# Patient Record
Sex: Female | Born: 1990 | Race: White | Hispanic: No | Marital: Single | Smoking: Never smoker
Health system: Southern US, Community
[De-identification: ages and names within clinical notes are randomized; demographics above are authoritative.]

## PROBLEM LIST (undated history)

## (undated) DIAGNOSIS — E119 Type 2 diabetes mellitus without complications: Secondary | ICD-10-CM

## (undated) DIAGNOSIS — T7840XA Allergy, unspecified, initial encounter: Secondary | ICD-10-CM

## (undated) DIAGNOSIS — F32A Depression, unspecified: Secondary | ICD-10-CM

## (undated) DIAGNOSIS — Z9889 Other specified postprocedural states: Secondary | ICD-10-CM

## (undated) DIAGNOSIS — J45909 Unspecified asthma, uncomplicated: Secondary | ICD-10-CM

## (undated) DIAGNOSIS — F419 Anxiety disorder, unspecified: Secondary | ICD-10-CM

## (undated) DIAGNOSIS — R519 Headache, unspecified: Secondary | ICD-10-CM

## (undated) DIAGNOSIS — F99 Mental disorder, not otherwise specified: Secondary | ICD-10-CM

## (undated) DIAGNOSIS — R112 Nausea with vomiting, unspecified: Secondary | ICD-10-CM

## (undated) HISTORY — DX: Nausea with vomiting, unspecified: R11.2

## (undated) HISTORY — DX: Unspecified asthma, uncomplicated: J45.909

## (undated) HISTORY — DX: Allergy, unspecified, initial encounter: T78.40XA

---

## 2013-05-27 ENCOUNTER — Ambulatory Visit: Payer: Self-pay | Admitting: Family Medicine

## 2014-03-07 ENCOUNTER — Ambulatory Visit: Payer: Self-pay | Admitting: Family Medicine

## 2014-04-15 ENCOUNTER — Ambulatory Visit (INDEPENDENT_AMBULATORY_CARE_PROVIDER_SITE_OTHER): Payer: BC Managed Care – PPO | Admitting: Family Medicine

## 2014-04-15 ENCOUNTER — Encounter: Payer: Self-pay | Admitting: Family Medicine

## 2014-04-15 VITALS — BP 110/68 | HR 78 | Temp 98.5°F | Resp 16 | Ht 62.0 in | Wt 114.0 lb

## 2014-04-15 DIAGNOSIS — J209 Acute bronchitis, unspecified: Secondary | ICD-10-CM

## 2014-04-15 DIAGNOSIS — J45901 Unspecified asthma with (acute) exacerbation: Secondary | ICD-10-CM

## 2014-04-15 MED ORDER — PREDNISONE 20 MG PO TABS: ORAL_TABLET | ORAL | Status: DC

## 2014-04-15 MED ORDER — ALBUTEROL SULFATE HFA 108 (90 BASE) MCG/ACT IN AERS: 2.0000 | INHALATION_SPRAY | Freq: Four times a day (QID) | RESPIRATORY_TRACT | Status: DC | PRN

## 2014-04-15 MED ORDER — FLUTICASONE PROPIONATE 50 MCG/ACT NA SUSP: 2.0000 | Freq: Every day | NASAL | Status: DC

## 2014-04-15 MED ORDER — AZITHROMYCIN 250 MG PO TABS: ORAL_TABLET | ORAL | Status: DC

## 2014-04-15 NOTE — Progress Notes (Signed)
Subjective:    Patient ID: Angela Bryan, female    DOB: [birth year], 23 y.o.   MRN: [id number]  HPI This is a very pleasant 23 year old white female with past history of asthma and allergies since 4 days of unrelenting cough. He also reports mild shortness of breath and tightness in her chest. She is recently had allergies with postnasal drip rhinorrhea and pain and pressure behind her right ear. She continues to have bilateral maxillary sinus pain and pressure. She continues to have pain and pressure behind her right ear but her primary concern today is a cough. The cough is nonproductive. She denies any hemoptysis. She denies any pleurisy she is not currently taking anything for her allergies. She took one dose of albuterol this morning but did not notice any benefit. Past Medical History  Diagnosis Date  . Allergy   . Asthma    No current outpatient prescriptions on file prior to visit.   No current facility-administered medications on file prior to visit.   Patient has no known drug allergies History   Social History  . Marital Status: Single    Spouse Name: N/A    Number of Children: N/A  . Years of Education: N/A   Occupational History  . Not on file.   Social History Main Topics  . Smoking status: Never Smoker   . Smokeless tobacco: Not on file  . Alcohol Use: Yes     Comment: occasional  . Drug Use: No  . Sexual Activity: Not on file   Other Topics Concern  . Not on file   Social History Narrative  . No narrative on file   No family history on file.    Review of Systems  All other systems reviewed and are negative.      Objective:   Physical Exam  Vitals reviewed. Constitutional: She appears well-developed and well-nourished. No distress.  HENT:  Right Ear: External ear and ear canal normal. Tympanic membrane is bulging.  Left Ear: Tympanic membrane, external ear and ear canal normal.  Nose: Mucosal edema and rhinorrhea present. Right sinus exhibits  no maxillary sinus tenderness and no frontal sinus tenderness. Left sinus exhibits no maxillary sinus tenderness and no frontal sinus tenderness.  Mouth/Throat: Oropharynx is clear and moist. No oropharyngeal exudate.  Cardiovascular: Normal rate, regular rhythm and normal heart sounds.   Pulmonary/Chest: Effort normal. She has decreased breath sounds. She has wheezes.  Skin: She is not diaphoretic.          Assessment & Plan:  1. Asthma with acute exacerbation Patient is mainly having allergies which is exacerbating her asthma triggered an asthma exacerbation. Therefore I recommended a prednisone taper pack and then she began albuterol 2 puffs inhaled every 4-6 hours as needed. Anticipate dramatic improvement over the next 48 hours. I would like her to continue Flonase after the resolution of this attack to prevent future allergy problem/and control asthma triggers - predniSONE (DELTASONE) 20 MG tablet; 3 tabs poqday 1-2, 2 tabs poqday 3-4, 1 tab poqday 5-6  Dispense: 12 tablet; Refill: 0 - albuterol (VENTOLIN HFA) 108 (90 BASE) MCG/ACT inhaler; Inhale 2 puffs into the lungs every 6 (six) hours as needed for wheezing or shortness of breath.  Dispense: 1 Inhaler; Refill: 3 - fluticasone (FLONASE) 50 MCG/ACT nasal spray; Place 2 sprays into both nostrils daily.  Dispense: 16 g; Refill: 6  2. Acute bronchitis Symptoms worsen or she develops a high fever or the cough becomes productive of purulent sputum,  the patient can take the z-pack. - azithromycin (ZITHROMAX) 250 MG tablet; 2 tabs poqday1, 1 tab poqday 2-5  Dispense: 6 tablet; Refill: 0

## 2014-10-21 ENCOUNTER — Encounter: Payer: BC Managed Care – PPO | Admitting: Family Medicine

## 2014-11-05 ENCOUNTER — Telehealth: Payer: Self-pay | Admitting: *Deleted

## 2014-11-05 NOTE — Telephone Encounter (Signed)
Pt called stating needing immunizations record and will come or mom come to pick them up, immunizations record is in file to be picked up up front.

## 2015-04-27 ENCOUNTER — Ambulatory Visit (INDEPENDENT_AMBULATORY_CARE_PROVIDER_SITE_OTHER): Payer: BLUE CROSS/BLUE SHIELD | Admitting: Family Medicine

## 2015-04-27 ENCOUNTER — Encounter: Payer: Self-pay | Admitting: Family Medicine

## 2015-04-27 VITALS — BP 100/64 | HR 98 | Temp 98.5°F | Resp 18 | Wt 118.0 lb

## 2015-04-27 DIAGNOSIS — J4531 Mild persistent asthma with (acute) exacerbation: Secondary | ICD-10-CM | POA: Diagnosis not present

## 2015-04-27 MED ORDER — PREDNISONE 20 MG PO TABS: 60.0000 mg | ORAL_TABLET | Freq: Every day | ORAL | Status: DC

## 2015-04-27 MED ORDER — BECLOMETHASONE DIPROPIONATE 80 MCG/ACT IN AERS: 2.0000 | INHALATION_SPRAY | Freq: Every day | RESPIRATORY_TRACT | Status: DC

## 2015-04-27 MED ORDER — ALBUTEROL SULFATE HFA 108 (90 BASE) MCG/ACT IN AERS: 2.0000 | INHALATION_SPRAY | Freq: Four times a day (QID) | RESPIRATORY_TRACT | Status: DC | PRN

## 2015-04-27 MED ORDER — FLUTICASONE PROPIONATE 50 MCG/ACT NA SUSP: 2.0000 | Freq: Every day | NASAL | Status: DC

## 2015-04-27 NOTE — Progress Notes (Signed)
   Subjective:    Patient ID: Angela Bryan, female    DOB: January 27, 1991, 24 y.o.   MRN: [id number]  HPI Patient presents with one-week of worsening cough, wheezing, shortness of breath, and dyspnea on exertion. She has a history of asthma which is triggered by her allergies and she believes this is the case again. On examination today her airways are tight, she has prolonged expiration, and audible wheezes.  There is no increased work of breathing or respiratory distress. Patient states that 2-3 times year she has to take prednisone for asthma exacerbations. Past Medical History  Diagnosis Date  . Allergy   . Asthma    No past surgical history on file. No current outpatient prescriptions on file prior to visit.   No current facility-administered medications on file prior to visit.   No Known Allergies History   Social History  . Marital Status: Single    Spouse Name: N/A  . Number of Children: N/A  . Years of Education: N/A   Occupational History  . Not on file.   Social History Main Topics  . Smoking status: Never Smoker   . Smokeless tobacco: Not on file  . Alcohol Use: Yes     Comment: occasional  . Drug Use: No  . Sexual Activity: Not on file   Other Topics Concern  . Not on file   Social History Narrative     Review of Systems  All other systems reviewed and are negative.      Objective:   Physical Exam  HENT:  Right Ear: External ear normal.  Left Ear: External ear normal.  Nose: Nose normal.  Mouth/Throat: Oropharynx is clear and moist. No oropharyngeal exudate.  Eyes: Conjunctivae are normal.  Neck: Neck supple.  Cardiovascular: Normal rate, regular rhythm and normal heart sounds.   Pulmonary/Chest: Effort normal. No respiratory distress. She has wheezes.  Abdominal: Soft. Bowel sounds are normal.  Vitals reviewed.         Assessment & Plan:  Asthma with acute exacerbation, mild persistent - Plan: predniSONE (DELTASONE) 20 MG tablet,  albuterol (VENTOLIN HFA) 108 (90 BASE) MCG/ACT inhaler, fluticasone (FLONASE) 50 MCG/ACT nasal spray, beclomethasone (QVAR) 80 MCG/ACT inhaler   begin prednisone 60 mg a day for 5 days. Use albuterol 2 puffs inhaled every 6 hours as needed. Continue her Flonase. Due to the frequency of her exacerbations I recommended that she start Qvar 80 g per actuation, 1 inhalation twice a day.  Recheck in 48 hours if no better or sooner if worse

## 2016-02-10 ENCOUNTER — Ambulatory Visit (INDEPENDENT_AMBULATORY_CARE_PROVIDER_SITE_OTHER): Payer: BC Managed Care – PPO | Admitting: Physician Assistant

## 2016-02-10 ENCOUNTER — Encounter: Payer: Self-pay | Admitting: Physician Assistant

## 2016-02-10 ENCOUNTER — Encounter: Payer: Self-pay | Admitting: Family Medicine

## 2016-02-10 ENCOUNTER — Other Ambulatory Visit: Payer: Self-pay | Admitting: Physician Assistant

## 2016-02-10 VITALS — BP 104/70 | HR 76 | Temp 98.3°F | Resp 18 | Wt 123.0 lb

## 2016-02-10 DIAGNOSIS — H66001 Acute suppurative otitis media without spontaneous rupture of ear drum, right ear: Secondary | ICD-10-CM | POA: Diagnosis not present

## 2016-02-10 DIAGNOSIS — R6883 Chills (without fever): Secondary | ICD-10-CM

## 2016-02-10 DIAGNOSIS — J988 Other specified respiratory disorders: Secondary | ICD-10-CM

## 2016-02-10 DIAGNOSIS — B9689 Other specified bacterial agents as the cause of diseases classified elsewhere: Secondary | ICD-10-CM

## 2016-02-10 LAB — INFLUENZA A AND B AG, IMMUNOASSAY
INFLUENZA B ANTIGEN: NOT DETECTED
Influenza A Antigen: NOT DETECTED

## 2016-02-10 MED ORDER — AMOXICILLIN 875 MG PO TABS: 875.0000 mg | ORAL_TABLET | Freq: Two times a day (BID) | ORAL | Status: DC

## 2016-02-10 NOTE — Progress Notes (Signed)
    Patient ID: Angela Bryan MRN: [id number], DOB: May 11, 1991, 25 y.o. Date of Encounter: 02/10/2016, 10:04 AM    Chief Complaint:  Chief Complaint  Patient presents with  . rt ear ache x 3 days    cough, chill, body aches     HPI: 25 y.o. year old white female presents with above. She says that really she has mostly just been having right ear pain. A little bit of runny nose but not a lot. Right ear pain for about 4 days. It has been very sore. No known fevers or chills. No significant sore throat.     Home Meds:   Outpatient Prescriptions Prior to Visit  Medication Sig Dispense Refill  . albuterol (VENTOLIN HFA) 108 (90 BASE) MCG/ACT inhaler Inhale 2 puffs into the lungs every 6 (six) hours as needed for wheezing or shortness of breath. 1 Inhaler 3  . beclomethasone (QVAR) 80 MCG/ACT inhaler Inhale 2 puffs into the lungs daily. 1 Inhaler 12  . fluticasone (FLONASE) 50 MCG/ACT nasal spray Place 2 sprays into both nostrils daily. 16 g 6  . predniSONE (DELTASONE) 20 MG tablet Take 3 tablets (60 mg total) by mouth daily with breakfast. 15 tablet 0   No facility-administered medications prior to visit.    Allergies: No Known Allergies    Review of Systems: See HPI for pertinent ROS. All other ROS negative.    Physical Exam: Blood pressure 104/70, pulse 76, temperature 98.3 F (36.8 C), temperature source Oral, resp. rate 18, weight 123 lb (55.792 kg)., Body mass index is 22.49 kg/(m^2). General: WNWD WF.  Appears in no acute distress. HEENT: Normocephalic, atraumatic, eyes without discharge, sclera non-icteric, nares are without discharge. Bilateral auditory canals clear, TM's are without perforation. Right TM is dull, erythematous, retracted. Left TM slightly dull, slightly retracted.  Oral cavity moist, posterior pharynx without exudate, erythema, peritonsillar abscess.  Neck: Supple. No thyromegaly. No lymphadenopathy. Lungs: Clear bilaterally to auscultation without  wheezes, rales, or rhonchi. Breathing is unlabored. Heart: Regular rhythm. No murmurs, rubs, or gallops. Msk:  Strength and tone normal for age. Extremities/Skin: Warm and dry. No rashes. Neuro: Alert and oriented X 3. Moves all extremities spontaneously. Gait is normal. CNII-XII grossly in tact. Psych:  Responds to questions appropriately with a normal affect.     ASSESSMENT AND PLAN:  25 y.o. year old female with  1. Bacterial respiratory infection She is to take amoxicillin as directed and complete all of it. Can use over-the-counter decongestants Tylenol and Motrin to help with symptom relief. Some over-the-counter cough medication as needed. F/U if symptoms do not resolve at the completion of antibiotic. - amoxicillin (AMOXIL) 875 MG tablet; Take 1 tablet (875 mg total) by mouth 2 (two) times daily.  Dispense: 14 tablet; Refill: 0  2. Acute suppurative otitis media of right ear without spontaneous rupture of tympanic membrane, recurrence not specified - amoxicillin (AMOXIL) 875 MG tablet; Take 1 tablet (875 mg total) by mouth 2 (two) times daily.  Dispense: 14 tablet; Refill: 0  3. Chills without fever - Influenza a and b   Signed, [address] [place], [place], [facility] 02/10/2016 10:04 AM

## 2017-01-28 ENCOUNTER — Emergency Department (HOSPITAL_COMMUNITY): Payer: BC Managed Care – PPO

## 2017-01-28 ENCOUNTER — Emergency Department (HOSPITAL_COMMUNITY)
Admission: EM | Admit: 2017-01-28 | Discharge: 2017-01-28 | Disposition: A | Discharge status: Home / Self Care | Payer: BC Managed Care – PPO | Attending: Emergency Medicine | Admitting: Emergency Medicine

## 2017-01-28 ENCOUNTER — Encounter (HOSPITAL_COMMUNITY): Payer: Self-pay | Admitting: Emergency Medicine

## 2017-01-28 DIAGNOSIS — Z79899 Other long term (current) drug therapy: Secondary | ICD-10-CM | POA: Diagnosis not present

## 2017-01-28 DIAGNOSIS — R1031 Right lower quadrant pain: Secondary | ICD-10-CM | POA: Diagnosis not present

## 2017-01-28 DIAGNOSIS — J45909 Unspecified asthma, uncomplicated: Secondary | ICD-10-CM | POA: Diagnosis not present

## 2017-01-28 LAB — URINALYSIS, ROUTINE W REFLEX MICROSCOPIC
BACTERIA UA: NONE SEEN
BILIRUBIN URINE: NEGATIVE
Glucose, UA: NEGATIVE mg/dL
Ketones, ur: NEGATIVE mg/dL
Leukocytes, UA: NEGATIVE
Nitrite: NEGATIVE
PH: 5 (ref 5.0–8.0)
Protein, ur: NEGATIVE mg/dL
Specific Gravity, Urine: 1.018 (ref 1.005–1.030)

## 2017-01-28 LAB — COMPREHENSIVE METABOLIC PANEL
ALBUMIN: 4.5 g/dL (ref 3.5–5.0)
ALT: 26 U/L (ref 14–54)
AST: 31 U/L (ref 15–41)
Alkaline Phosphatase: 43 U/L (ref 38–126)
Anion gap: 8 (ref 5–15)
BILIRUBIN TOTAL: 0.4 mg/dL (ref 0.3–1.2)
BUN: 8 mg/dL (ref 6–20)
CHLORIDE: 109 mmol/L (ref 101–111)
CO2: 23 mmol/L (ref 22–32)
CREATININE: 0.62 mg/dL (ref 0.44–1.00)
Calcium: 9.6 mg/dL (ref 8.9–10.3)
GFR calc Af Amer: >60 mL/min (ref >60)
GLUCOSE: 107 mg/dL — AB (ref 65–99)
POTASSIUM: 3.9 mmol/L (ref 3.5–5.1)
Sodium: 140 mmol/L (ref 135–145)
Total Protein: 7.6 g/dL (ref 6.5–8.1)

## 2017-01-28 LAB — POC URINE PREG, ED: PREG TEST UR: NEGATIVE

## 2017-01-28 LAB — CBC
HEMATOCRIT: 42 % (ref 36.0–46.0)
Hemoglobin: 14.1 g/dL (ref 12.0–15.0)
MCH: 28.6 pg (ref 26.0–34.0)
MCHC: 33.6 g/dL (ref 30.0–36.0)
MCV: 85.2 fL (ref 78.0–100.0)
PLATELETS: 239 10*3/uL (ref 150–400)
RBC: 4.93 MIL/uL (ref 3.87–5.11)
RDW: 13.5 % (ref 11.5–15.5)
WBC: 12.2 10*3/uL — AB (ref 4.0–10.5)

## 2017-01-28 LAB — LIPASE, BLOOD: LIPASE: 27 U/L (ref 11–51)

## 2017-01-28 MED ORDER — IOPAMIDOL (ISOVUE-300) INJECTION 61%
INTRAVENOUS | Status: AC
  Administered 2017-01-28: 100 mL
  Filled 2017-01-28: qty 100

## 2017-01-28 MED ORDER — SODIUM CHLORIDE 0.9 % IV BOLUS (SEPSIS)
1000.0000 mL | Freq: Once | INTRAVENOUS | Status: AC
  Administered 2017-01-28: 1000 mL via INTRAVENOUS

## 2017-01-28 NOTE — ED Notes (Signed)
Pt and family understood dc material. NAD noted 

## 2017-01-28 NOTE — ED Notes (Signed)
Patient transported to Ultrasound 

## 2017-01-28 NOTE — ED Triage Notes (Signed)
C/o LUQ pain 2 days ago. Pain is now constant, sharp, RLQ since yesterday with nausea.  Diarrhea earlier in the week that has resolved.  Denies vomiting and urinary complaints.

## 2017-01-28 NOTE — ED Notes (Signed)
Patient transported to CT 

## 2017-01-28 NOTE — ED Provider Notes (Signed)
[facility]-EMERGENCY DEPT Provider Note   CSN: [id number] Arrival date & time: 01/28/17  0100  By signing my name below, I, Angela Bryan, attest that this documentation has been prepared under the direction and in the presence of Nira Conn, MD.  Electronically Signed: Octavia Heir, ED Scribe. 01/28/17. 2:45 AM.    History   Chief Complaint Chief Complaint  Patient presents with  . Abdominal Pain   The history is provided by the patient. No language interpreter was used.   HPI Comments: Angela Bryan is a 26 y.o. female who presents to the Emergency Department complaining of gradual onset, gradual worsening RLQ abdominal pain x 2-3 days. She says the pain is constant and is a sharp sensation. She reports nausea and diarrhea x 3 days. Pt further states she has not had a bowel movement today. Pt says the pain started in her epigastric region which she described as a dull sensation. She notes the pain radiated to her RLQ as the day went on and has become progressively worse. Pt has no abdominal surgical hx of appendectomy nor has she had any surgeries performed in the past. Pt has the nexplanon implanon birth control in her left arm and reports spotting occasionally but says she does not normally et full menstruals. She is currently spotting. She denies dysuria, vaginal discharge, fever, rhinorrhea, cough, or congestion.    Past Medical History:  Diagnosis Date  . Allergy   . Asthma     There are no active problems to display for this patient.   History reviewed. No pertinent surgical history.  OB History    No data available       Home Medications    Prior to Admission medications   Medication Sig Start Date End Date Taking? Authorizing Provider  albuterol (VENTOLIN HFA) 108 (90 BASE) MCG/ACT inhaler Inhale 2 puffs into the lungs every 6 (six) hours as needed for wheezing or shortness of breath. 04/27/15   Donita Brooks, MD  amoxicillin (AMOXIL) 875 MG tablet Take 1  tablet (875 mg total) by mouth 2 (two) times daily. 02/10/16   Patriciaann Clan Dixon, PA-C  beclomethasone (QVAR) 80 MCG/ACT inhaler Inhale 2 puffs into the lungs daily. 04/27/15   Donita Brooks, MD  etonogestrel (NEXPLANON) 68 MG IMPL implant Inject into the skin.    Historical Provider, MD  fluticasone (FLONASE) 50 MCG/ACT nasal spray Place 2 sprays into both nostrils daily. 04/27/15   Donita Brooks, MD    Family History No family history on file.  Social History Social History  Substance Use Topics  . Smoking status: Never Smoker  . Smokeless tobacco: Never Used  . Alcohol use Yes     Comment: occasional     Allergies   Patient has no known allergies.   Review of Systems Review of Systems  A complete 10 system review of systems was obtained and all systems are negative except as noted in the HPI and PMH.   Physical Exam Updated Vital Signs BP 133/92 (BP Location: Left Arm)   Pulse (!) 130   Temp 98.4 F (36.9 C) (Oral)   Resp 16   Ht 5\' 2"  (1.575 m)   Wt 120 lb (54.4 kg)   SpO2 100%   BMI 21.95 kg/m   Physical Exam  Constitutional: She is oriented to person, place, and time. She appears well-developed and well-nourished. No distress.  HENT:  Head: Normocephalic and atraumatic.  Right Ear: External ear normal.  Left Ear:  External ear normal.  Nose: Nose normal.  Eyes: Conjunctivae and EOM are normal. No scleral icterus.  Neck: Normal range of motion and phonation normal.  Cardiovascular: Regular rhythm.  Tachycardia present.   Pulmonary/Chest: Effort normal. No stridor. No respiratory distress.  Abdominal: Soft. She exhibits no distension. There is tenderness in the right lower quadrant. There is rebound. There is no guarding.  Positive Rovsing's, positive obturators sign, positive Psoas  Musculoskeletal: Normal range of motion. She exhibits no edema.  Neurological: She is alert and oriented to person, place, and time.  Skin: She is not diaphoretic.  Psychiatric:  She has a normal mood and affect. Her behavior is normal.  Vitals reviewed.    ED Treatments / Results  DIAGNOSTIC STUDIES: Oxygen Saturation is 100% on RA, normal by my interpretation.  COORDINATION OF CARE:  2:42 AM Discussed treatment plan with pt at bedside and pt agreed to plan.  Labs (all labs ordered are listed, but only abnormal results are displayed) Labs Reviewed  COMPREHENSIVE METABOLIC PANEL - Abnormal; Notable for the following:       Result Value   Glucose, Bld 107 (*)    All other components within normal limits  CBC - Abnormal; Notable for the following:    WBC 12.2 (*)    All other components within normal limits  URINALYSIS, ROUTINE W REFLEX MICROSCOPIC - Abnormal; Notable for the following:    Hgb urine dipstick MODERATE (*)    Squamous Epithelial / LPF 0-5 (*)    All other components within normal limits  LIPASE, BLOOD  POC URINE PREG, ED    EKG  EKG Interpretation None       Radiology US Transvaginal Non-ob  Result Date: 01/28/2017 CLINICAL DATA:  Right lower quadrant pain. EXAM: TRANSABDOMINAL AND TRANSVAGINAL ULTRASOUND OF PELVIS DOPPLER ULTRASOUND OF OVARIES TECHNIQUE: Both transabdominal and transvaginal ultrasound examinations of the pelvis were performed. Transabdominal technique was performed for global imaging of the pelvis including uterus, ovaries, adnexal regions, and pelvic cul-de-sac. It was necessary to proceed with endovaginal exam following the transabdominal exam to visualize the endometrium and ovaries. Color and duplex Doppler ultrasound was utilized to evaluate blood flow to the ovaries. COMPARISON:  CT abdomen/ pelvis earlier this day. FINDINGS: Uterus Measurements: 6.4 x 2.5 x 4.0 cm. No fibroids or other mass visualized. Endometrium Thickness: 6.2 mm. No focal abnormality visualized. Right ovary Measurements: 4.5 x 2.3 x 2.7 cm. Collapsing corpus luteal cyst measures 2.7 cm. There is normal blood flow. No adnexal mass. Left ovary  Measurements: 2.7 x 3.7 x 2.6 cm. Normal appearance/no adnexal mass. Normal blood flow. Pulsed Doppler evaluation of both ovaries demonstrates normal low-resistance arterial and venous waveforms. Other findings Small volume free fluid is physiologic. IMPRESSION: 1. Normal blood flow to both ovaries, no ovarian torsion. 2. Collapsing corpus luteal cyst in the right ovary, physiologic, likely the cause of patient's pain. Electronically Signed   By: Rubye Oaks M.D.   On: 01/28/2017 05:39   US Pelvis Complete  Result Date: 01/28/2017 CLINICAL DATA:  Right lower quadrant pain. EXAM: TRANSABDOMINAL AND TRANSVAGINAL ULTRASOUND OF PELVIS DOPPLER ULTRASOUND OF OVARIES TECHNIQUE: Both transabdominal and transvaginal ultrasound examinations of the pelvis were performed. Transabdominal technique was performed for global imaging of the pelvis including uterus, ovaries, adnexal regions, and pelvic cul-de-sac. It was necessary to proceed with endovaginal exam following the transabdominal exam to visualize the endometrium and ovaries. Color and duplex Doppler ultrasound was utilized to evaluate blood flow to the ovaries. COMPARISON:  CT abdomen/ pelvis earlier this day. FINDINGS: Uterus Measurements: 6.4 x 2.5 x 4.0 cm. No fibroids or other mass visualized. Endometrium Thickness: 6.2 mm. No focal abnormality visualized. Right ovary Measurements: 4.5 x 2.3 x 2.7 cm. Collapsing corpus luteal cyst measures 2.7 cm. There is normal blood flow. No adnexal mass. Left ovary Measurements: 2.7 x 3.7 x 2.6 cm. Normal appearance/no adnexal mass. Normal blood flow. Pulsed Doppler evaluation of both ovaries demonstrates normal low-resistance arterial and venous waveforms. Other findings Small volume free fluid is physiologic. IMPRESSION: 1. Normal blood flow to both ovaries, no ovarian torsion. 2. Collapsing corpus luteal cyst in the right ovary, physiologic, likely the cause of patient's pain. Electronically Signed   By: Rubye Oaks M.D.   On: 01/28/2017 05:39   Ct Abdomen Pelvis W Contrast  Result Date: 01/28/2017 CLINICAL DATA:  Right lower quadrant pain and nausea. EXAM: CT ABDOMEN AND PELVIS WITH CONTRAST TECHNIQUE: Multidetector CT imaging of the abdomen and pelvis was performed using the standard protocol following bolus administration of intravenous contrast. CONTRAST:  ISOVUE-300 IOPAMIDOL (ISOVUE-300) INJECTION 61% COMPARISON:  None. FINDINGS: Lower chest: Lung bases are clear. Hepatobiliary: No focal liver abnormality is seen. No gallstones, gallbladder wall thickening, or biliary dilatation. Pancreas: No ductal dilatation or inflammation. Spleen: Normal in size without focal abnormality. Splenules anteriorly. Adrenals/Urinary Tract: Adrenal glands are unremarkable. Kidneys are normal, without renal calculi, focal lesion, or hydronephrosis. Bladder is unremarkable. Stomach/Bowel: Stomach is within normal limits. Appendix appears normal. No evidence of bowel wall thickening, distention, or inflammatory changes. Moderate stool burden throughout the colon. Vascular/Lymphatic: No significant vascular findings are present. No enlarged abdominal or pelvic lymph nodes. Reproductive: Peripherally enhancing corpus luteal cyst in the right ovary. Left ovary is normal in size. Uterus is unremarkable. Trace free fluid in the pelvis is physiologic. Other: No free air or intra- abdominal abscess. Tiny fat containing umbilical hernia. Musculoskeletal: There are no acute or suspicious osseous abnormalities. Scattered bone islands in the pelvis and proximal femora. IMPRESSION: 1. Normal appendix. 2. Corpus luteal cyst in the right ovary, physiologic, but likely cause of patient's pain. Electronically Signed   By: Rubye Oaks M.D.   On: 01/28/2017 03:48   [place] Art/ven Flow Abd Pelv Doppler  Result Date: 01/28/2017 CLINICAL DATA:  Right lower quadrant pain. EXAM: TRANSABDOMINAL AND TRANSVAGINAL ULTRASOUND OF PELVIS DOPPLER  ULTRASOUND OF OVARIES TECHNIQUE: Both transabdominal and transvaginal ultrasound examinations of the pelvis were performed. Transabdominal technique was performed for global imaging of the pelvis including uterus, ovaries, adnexal regions, and pelvic cul-de-sac. It was necessary to proceed with endovaginal exam following the transabdominal exam to visualize the endometrium and ovaries. Color and duplex Doppler ultrasound was utilized to evaluate blood flow to the ovaries. COMPARISON:  CT abdomen/ pelvis earlier this day. FINDINGS: Uterus Measurements: 6.4 x 2.5 x 4.0 cm. No fibroids or other mass visualized. Endometrium Thickness: 6.2 mm. No focal abnormality visualized. Right ovary Measurements: 4.5 x 2.3 x 2.7 cm. Collapsing corpus luteal cyst measures 2.7 cm. There is normal blood flow. No adnexal mass. Left ovary Measurements: 2.7 x 3.7 x 2.6 cm. Normal appearance/no adnexal mass. Normal blood flow. Pulsed Doppler evaluation of both ovaries demonstrates normal low-resistance arterial and venous waveforms. Other findings Small volume free fluid is physiologic. IMPRESSION: 1. Normal blood flow to both ovaries, no ovarian torsion. 2. Collapsing corpus luteal cyst in the right ovary, physiologic, likely the cause of patient's pain. Electronically Signed   By: Lujean Rave.D.  On: 01/28/2017 05:39    Procedures Procedures (including critical care time)  Medications Ordered in ED Medications  sodium chloride 0.9 % bolus 1,000 mL (0 mLs Intravenous Stopped 01/28/17 0512)  iopamidol (ISOVUE-300) 61 % injection (100 mLs  Contrast Given 01/28/17 0332)     Initial Impression / Assessment and Plan / ED Course  I have reviewed the triage vital signs and the nursing notes.  Pertinent labs & imaging results that were available during my care of the patient were reviewed by me and considered in my medical decision making (see chart for details).  Clinical Course as of Jan 28 553  Sat Jan 28, 2017  [id number]  Concern for appendicitis. No GU symptoms. Low suspicion for STI/PID. UA w/o infection; blood like contaminate from vaginal spotting. Low suspicion for torsion given duration.  [PC]  0242 Declined pain medicine at this time.   [PC]  0547 CT w/o appendicitis. Did note right adnexal cyst. Will obtain US for better visualization.  [PC]  [id number] US confirmed corpus luteum. The patient is safe for discharge with strict return precautions.   [PC]    Clinical Course User Index [PC] Nira ConnPedro Eduardo Laquinn Shippy, MD      Final Clinical Impressions(s) / ED Diagnoses   Final diagnoses:  RLQ abdominal pain   Disposition: Discharge  Condition: Good  I have discussed the results, Dx and Tx plan with the patient who expressed understanding and agree(s) with the plan. Discharge instructions discussed at great length. The patient was given strict return precautions who verbalized understanding of the instructions. No further questions at time of discharge.    New Prescriptions   No medications on file    Follow Up: Donita BrooksWarren T Pickard, MD [address] [place] [id number] [phone]  Schedule an appointment as soon as possible for a visit  As needed   I personally performed the services described in this documentation, which was scribed in my presence. The recorded information has been reviewed and is accurate.        Nira ConnPedro Eduardo Montine Hight, MD 01/28/17 [phone]

## 2017-01-30 ENCOUNTER — Telehealth: Payer: Self-pay | Admitting: Family Medicine

## 2017-01-30 NOTE — Telephone Encounter (Signed)
Pt's mother called [facility] stating that pt went to er and dx with ovarian cyst and is in a lot of pain and having trouble walking, having N,V & D could I please call her daughter. Called and spoke to pt to set up appt. and she was feeling better and she was at work and could not come in for an appointment but if she got worse she would call us back.

## 2017-02-01 ENCOUNTER — Ambulatory Visit: Payer: BC Managed Care – PPO | Admitting: Family Medicine

## 2017-02-03 ENCOUNTER — Ambulatory Visit
Admission: RE | Admit: 2017-02-03 | Discharge: 2017-02-03 | Disposition: A | Discharge status: Home / Self Care | Payer: BC Managed Care – PPO | Source: Ambulatory Visit | Attending: Family Medicine | Admitting: Family Medicine

## 2017-02-03 ENCOUNTER — Ambulatory Visit (INDEPENDENT_AMBULATORY_CARE_PROVIDER_SITE_OTHER): Payer: BC Managed Care – PPO | Admitting: Family Medicine

## 2017-02-03 ENCOUNTER — Encounter: Payer: Self-pay | Admitting: Family Medicine

## 2017-02-03 VITALS — BP 122/64 | HR 110 | Temp 99.4°F | Resp 14 | Ht 62.5 in | Wt 131.0 lb

## 2017-02-03 DIAGNOSIS — D72829 Elevated white blood cell count, unspecified: Secondary | ICD-10-CM

## 2017-02-03 DIAGNOSIS — K5900 Constipation, unspecified: Secondary | ICD-10-CM | POA: Insufficient documentation

## 2017-02-03 DIAGNOSIS — R1084 Generalized abdominal pain: Secondary | ICD-10-CM | POA: Diagnosis present

## 2017-02-03 LAB — CBC
HCT: 37.8 % (ref 35.0–45.0)
Hemoglobin: 12.9 g/dL (ref 12.0–15.0)
MCH: 29.4 pg (ref 27.0–33.0)
MCHC: 34.1 g/dL (ref 32.0–36.0)
MCV: 86.1 fL (ref 80.0–100.0)
PLATELETS: 291 10*3/uL (ref 140–400)
RBC: 4.39 MIL/uL (ref 3.80–5.10)
RDW: 13.2 % (ref 11.0–15.0)
WBC: 11.4 10*3/uL — ABNORMAL HIGH (ref 3.8–10.8)

## 2017-02-03 LAB — URINALYSIS, ROUTINE W REFLEX MICROSCOPIC
Bilirubin Urine: NEGATIVE
GLUCOSE, UA: NEGATIVE
Leukocytes, UA: NEGATIVE
NITRITE: NEGATIVE
PH: 5.5 (ref 5.0–8.0)
Protein, ur: NEGATIVE
Specific Gravity, Urine: 1.02 (ref 1.001–1.035)

## 2017-02-03 LAB — URINALYSIS, MICROSCOPIC ONLY
Casts: NONE SEEN [LPF]
Crystals: NONE SEEN [HPF]
Yeast: NONE SEEN [HPF]

## 2017-02-03 MED ORDER — ONDANSETRON 4 MG PO TBDP: 4.0000 mg | ORAL_TABLET | Freq: Three times a day (TID) | ORAL | 0 refills | Status: DC | PRN

## 2017-02-03 NOTE — Patient Instructions (Addendum)
Take miralax 1 cap full daily twice a day until bowel movement, then go down to 1 a day, if you get diarrhea you can stop  Take the motrin as needed Get xray

## 2017-02-03 NOTE — Progress Notes (Signed)
Subjective:    Patient ID: Angela Bryan, female    DOB: 12-19-1991, 26 y.o.   MRN: [id number]  Patient presents for ED F/U (right sided lower abd pain, heavy bleeding with period, N/V, elevated WBC- dx: R ovarian cyst) Patient here to follow-up emergency room visit. She was seen in the emergency room on the third with right lower quadrant abdominal pain CT scan which done which showed right ovarian cyst. Ultrasound was done which showed collapsing corpus luteal cyst in the right ovary as the cause of her pain. She is currently on nexplanon  for birth control. She had labs done which showed mildly elevated white blood cell count 12.2 metabolic panel was unremarkable urine pregnancy test was negative , urinalysis showed moderate blood but only 0-5 red blood cells showed a in the microscopy otherwise no sign of infection.  She had improvement in pain for about 24 hours but then started bleeding on Sat heavy until Wed. She has had constant pain mostly on right side, for the past few days. Had N/V today after eating applesauce. Had a few other episodes of nausea this week and one other episode of vomiting she does not recall when. Denies vaginal discharge, no dysuria, has not had BM in 2 days, CT did show moderate stool burden.     Review Of Systems:  GEN- denies fatigue, fever, weight loss,weakness, recent illness HEENT- denies eye drainage, change in vision, nasal discharge, CVS- denies chest pain, palpitations RESP- denies SOB, cough, wheeze ABD- denies N/V, +change in stools, +abd pain GU- denies dysuria, hematuria, dribbling, incontinence MSK- denies joint pain, muscle aches, injury Neuro- denies headache, dizziness, syncope, seizure activity       Objective:    BP 122/64   Pulse (!) 110   Temp 99.4 F (37.4 C) (Oral)   Resp 14   Ht 5' 2.5" (1.588 m)   Wt 131 lb (59.4 kg)   SpO2 98%   BMI 23.58 kg/m  GEN- NAD, alert and oriented x3, non toxic appearing  HEENT- PERRL, EOMI,  non injected sclera, pink conjunctiva, MMM, oropharynx clear CVS- mild tachycardia HR 100, no murmur RESP-CTAB ABD-NABS,soft,ND, mild TTP diffusely, no rebound, no guarding, no CVA tenderness  Pulses- Radial 2+        Assessment & Plan:      Problem List Items Addressed This Visit    None    Visit Diagnoses    Generalized abdominal pain    -  Primary   Unclear cause may have viral illness or white blood cell count is improved today. It is possible she has multiple things going on. She did have the luteal cyst was slightly did rupture and it appears that she has had a cycle on her Nexplanon. Her urine pregnancy is negative. There is no sign of urinary tract infection. She denies any vaginal discharge. Constipation is deathly seen on the scan which continue be contributing to some abdominal pain as well as nausea and emesis and she's not had a bowel movement a few days however I do want to rule out any evidence of obstruction since her symptoms seem to be worsening on that right side. There was no sign of any appendicitis no gallstones seen on of any coli this. She looks fairly well on examination.  Zofran sent for nausea, push fluids if xray neg for obstruction Mother also here with patient   Relevant Orders   Urinalysis, Routine w reflex microscopic (Completed)   Urine culture  CBC (Completed)   Comprehensive metabolic panel   DG Abd 2 Views   Constipation, unspecified constipation type       Will use miralax if Xray shows no obstruction    Relevant Orders   DG Abd 2 Views   Leukocytosis, unspecified type       improved, UA shows dehydration only    Relevant Orders   CBC (Completed)      Note: This dictation was prepared with Dragon dictation along with smaller phrase technology. Any transcriptional errors that result from this process are unintentional.

## 2017-02-04 LAB — COMPREHENSIVE METABOLIC PANEL
ALK PHOS: 48 U/L (ref 33–115)
ALT: 20 U/L (ref 6–29)
AST: 17 U/L (ref 10–30)
Albumin: 4.5 g/dL (ref 3.6–5.1)
BUN: 7 mg/dL (ref 7–25)
CALCIUM: 9.4 mg/dL (ref 8.6–10.2)
CO2: 24 mmol/L (ref 20–31)
Chloride: 105 mmol/L (ref 98–110)
Creat: 0.72 mg/dL (ref 0.50–1.10)
GLUCOSE: 83 mg/dL (ref 70–99)
POTASSIUM: 4.7 mmol/L (ref 3.5–5.3)
Sodium: 140 mmol/L (ref 135–146)
Total Bilirubin: 0.5 mg/dL (ref 0.2–1.2)
Total Protein: 7.4 g/dL (ref 6.1–8.1)

## 2017-02-05 LAB — URINE CULTURE: ORGANISM ID, BACTERIA: NO GROWTH

## 2017-02-06 ENCOUNTER — Emergency Department: Payer: BC Managed Care – PPO

## 2017-02-06 ENCOUNTER — Observation Stay
Admission: EM | Admit: 2017-02-06 | Discharge: 2017-02-07 | Disposition: A | Discharge status: Home / Self Care | Payer: BC Managed Care – PPO | Attending: Obstetrics & Gynecology | Admitting: Obstetrics & Gynecology

## 2017-02-06 ENCOUNTER — Encounter: Payer: Self-pay | Admitting: Emergency Medicine

## 2017-02-06 DIAGNOSIS — A749 Chlamydial infection, unspecified: Secondary | ICD-10-CM

## 2017-02-06 DIAGNOSIS — N739 Female pelvic inflammatory disease, unspecified: Secondary | ICD-10-CM

## 2017-02-06 DIAGNOSIS — A5611 Chlamydial female pelvic inflammatory disease: Secondary | ICD-10-CM | POA: Diagnosis not present

## 2017-02-06 DIAGNOSIS — A419 Sepsis, unspecified organism: Secondary | ICD-10-CM

## 2017-02-06 DIAGNOSIS — J45909 Unspecified asthma, uncomplicated: Secondary | ICD-10-CM | POA: Diagnosis not present

## 2017-02-06 DIAGNOSIS — R000 Tachycardia, unspecified: Secondary | ICD-10-CM | POA: Diagnosis not present

## 2017-02-06 DIAGNOSIS — R102 Pelvic and perineal pain: Secondary | ICD-10-CM | POA: Diagnosis present

## 2017-02-06 DIAGNOSIS — Z79899 Other long term (current) drug therapy: Secondary | ICD-10-CM | POA: Diagnosis not present

## 2017-02-06 DIAGNOSIS — Z7951 Long term (current) use of inhaled steroids: Secondary | ICD-10-CM | POA: Diagnosis not present

## 2017-02-06 HISTORY — DX: Female pelvic inflammatory disease, unspecified: N73.9

## 2017-02-06 LAB — COMPREHENSIVE METABOLIC PANEL
ALBUMIN: 3.6 g/dL (ref 3.5–5.0)
ALK PHOS: 51 U/L (ref 38–126)
ALT: 17 U/L (ref 14–54)
ANION GAP: 8 (ref 5–15)
AST: 22 U/L (ref 15–41)
BUN: 6 mg/dL (ref 6–20)
CALCIUM: 8.1 mg/dL — AB (ref 8.9–10.3)
CO2: 22 mmol/L (ref 22–32)
CREATININE: 0.73 mg/dL (ref 0.44–1.00)
Chloride: 106 mmol/L (ref 101–111)
GFR calc Af Amer: >60 mL/min (ref >60)
GFR calc non Af Amer: >60 mL/min (ref >60)
GLUCOSE: 96 mg/dL (ref 65–99)
Potassium: 3.9 mmol/L (ref 3.5–5.1)
SODIUM: 136 mmol/L (ref 135–145)
Total Bilirubin: 0.9 mg/dL (ref 0.3–1.2)
Total Protein: 7.2 g/dL (ref 6.5–8.1)

## 2017-02-06 LAB — CBC
HCT: 38.8 % (ref 35.0–47.0)
HEMOGLOBIN: 13.7 g/dL (ref 12.0–16.0)
MCH: 29.9 pg (ref 26.0–34.0)
MCHC: 35.3 g/dL (ref 32.0–36.0)
MCV: 84.7 fL (ref 80.0–100.0)
Platelets: 345 10*3/uL (ref 150–440)
RBC: 4.59 MIL/uL (ref 3.80–5.20)
RDW: 12.8 % (ref 11.5–14.5)
WBC: 13.8 10*3/uL — ABNORMAL HIGH (ref 3.6–11.0)

## 2017-02-06 LAB — URINALYSIS, COMPLETE (UACMP) WITH MICROSCOPIC
Bacteria, UA: NONE SEEN
Bilirubin Urine: NEGATIVE
GLUCOSE, UA: NEGATIVE mg/dL
Hgb urine dipstick: NEGATIVE
Ketones, ur: 20 mg/dL — AB
Nitrite: NEGATIVE
PROTEIN: NEGATIVE mg/dL
Specific Gravity, Urine: 1.003 — ABNORMAL LOW (ref 1.005–1.030)
pH: 6 (ref 5.0–8.0)

## 2017-02-06 LAB — WET PREP, GENITAL
Clue Cells Wet Prep HPF POC: NONE SEEN
Sperm: NONE SEEN
Trich, Wet Prep: NONE SEEN
YEAST WET PREP: NONE SEEN

## 2017-02-06 LAB — CHLAMYDIA/NGC RT PCR (ARMC ONLY)
CHLAMYDIA TR: DETECTED — AB
N gonorrhoeae: NOT DETECTED

## 2017-02-06 LAB — HCG, QUANTITATIVE, PREGNANCY: hCG, Beta Chain, Quant, S: 1 m[IU]/mL (ref <5)

## 2017-02-06 LAB — LIPASE, BLOOD: Lipase: 20 U/L (ref 11–51)

## 2017-02-06 MED ORDER — ONDANSETRON HCL 4 MG/2ML IJ SOLN
4.0000 mg | Freq: Once | INTRAMUSCULAR | Status: AC
  Administered 2017-02-06: 4 mg via INTRAVENOUS

## 2017-02-06 MED ORDER — CLINDAMYCIN PHOSPHATE 900 MG/50ML IV SOLN
900.0000 mg | Freq: Three times a day (TID) | INTRAVENOUS | Status: DC
  Administered 2017-02-06 – 2017-02-07 (×3): 900 mg via INTRAVENOUS
  Filled 2017-02-06 (×7): qty 50

## 2017-02-06 MED ORDER — AZITHROMYCIN 250 MG PO TABS
1000.0000 mg | ORAL_TABLET | Freq: Once | ORAL | Status: AC
  Administered 2017-02-06: 1000 mg via ORAL
  Filled 2017-02-06: qty 2
  Filled 2017-02-06: qty 4

## 2017-02-06 MED ORDER — IOPAMIDOL (ISOVUE-300) INJECTION 61%
15.0000 mL | INTRAVENOUS | Status: AC
  Administered 2017-02-06: 15 mL via ORAL

## 2017-02-06 MED ORDER — SODIUM CHLORIDE 0.9 % IV BOLUS (SEPSIS)
1000.0000 mL | Freq: Once | INTRAVENOUS | Status: AC
  Administered 2017-02-06: 1000 mL via INTRAVENOUS

## 2017-02-06 MED ORDER — ONDANSETRON HCL 4 MG/2ML IJ SOLN
4.0000 mg | INTRAMUSCULAR | Status: DC | PRN
  Administered 2017-02-06 – 2017-02-07 (×3): 4 mg via INTRAVENOUS
  Filled 2017-02-06 (×2): qty 2

## 2017-02-06 MED ORDER — ONDANSETRON HCL 4 MG/2ML IJ SOLN
INTRAMUSCULAR | Status: AC
  Administered 2017-02-06: 4 mg via INTRAVENOUS
  Filled 2017-02-06: qty 2

## 2017-02-06 MED ORDER — LACTATED RINGERS IV SOLN
INTRAVENOUS | Status: DC
  Administered 2017-02-06 – 2017-02-07 (×2): via INTRAVENOUS

## 2017-02-06 MED ORDER — CLINDAMYCIN PHOSPHATE 900 MG/50ML IV SOLN
900.0000 mg | Freq: Once | INTRAVENOUS | Status: AC
  Administered 2017-02-06: 900 mg via INTRAVENOUS
  Filled 2017-02-06: qty 50

## 2017-02-06 MED ORDER — DEXTROSE 5 % IV SOLN
5.0000 mg/kg | INTRAVENOUS | Status: DC
  Administered 2017-02-06 – 2017-02-07 (×2): 270 mg via INTRAVENOUS
  Filled 2017-02-06 (×4): qty 6.75

## 2017-02-06 MED ORDER — ONDANSETRON HCL 4 MG/2ML IJ SOLN
INTRAMUSCULAR | Status: AC
  Filled 2017-02-06: qty 2

## 2017-02-06 MED ORDER — IBUPROFEN 600 MG PO TABS
600.0000 mg | ORAL_TABLET | Freq: Four times a day (QID) | ORAL | Status: DC | PRN
  Administered 2017-02-06 – 2017-02-07 (×3): 600 mg via ORAL
  Filled 2017-02-06 (×3): qty 1

## 2017-02-06 NOTE — ED Notes (Signed)
Pt feeling sick to stomach. MD made aware and ordered zofran for pt

## 2017-02-06 NOTE — H&P (Signed)
Consult History and Physical   SERVICE: Gynecology   Patient Name: Angela Bryan Patient MRN:   [id number]  CC: right sided abdominal pain  HPI: Angela Bryan is a 26 y.o.  G0 with an 8-day history of right -sided pelvic pain, worsening.  Was previously seen for this and CT, Ultrasound done and appendicitis was ruled out.  Returned with pain and Chlamydia was + with mucopurulent discharge seen on exam by ED MD.  +CMT.  Afebrile, no recent fevers.    PMH: asthma PSH: none OBH: G0 GYN: no abnormal paps, no history of STI, LMP 01/31/17, regular.  nexplanon for contraception All: NKDA Social: occ etoh, no drugs/tob Fam: no gyn cancers    Review of Systems: positives in bold GEN:   fevers, chills, weight changes, appetite changes, fatigue, night sweats HEENT:  HA, vision changes, hearing loss, congestion, rhinorrhea, sinus pressure, dysphagia CV:   CP, palpitations PULM:  SOB, cough GI:  abd pain, N/V/D/C GU:  dysuria, urgency, frequency MSK:  arthralgias, myalgias, back pain, swelling SKIN:  rashes, color changes, pallor NEURO:  numbness, weakness, tingling, seizures, dizziness, tremors PSYCH:  depression, anxiety, behavioral problems, confusion  HEME/LYMPH:  easy bruising or bleeding ENDO:  heat/cold intolerance  Past Obstetrical History: OB History    No data available      Past Gynecologic History: Patient's last menstrual period was 01/31/2017 (exact date).   Past Medical History: Past Medical History:  Diagnosis Date  . Allergy   . Asthma     Past Surgical History:  History reviewed. No pertinent surgical history.  Family History:  family history is not on file.  Social History:  Social History   Social History  . Marital status: Single    Spouse name: N/A  . Number of children: N/A  . Years of education: N/A   Occupational History  . Not on file.   Social History Main Topics  . Smoking status: Never Smoker  . Smokeless tobacco: Never Used  .  Alcohol use Yes     Comment: occasional  . Drug use: No  . Sexual activity: Not on file   Other Topics Concern  . Not on file   Social History Narrative  . No narrative on file    Home Medications:  Medications reconciled in EPIC  No current facility-administered medications on file prior to encounter.    Current Outpatient Prescriptions on File Prior to Encounter  Medication Sig Dispense Refill  . albuterol (VENTOLIN HFA) 108 (90 BASE) MCG/ACT inhaler Inhale 2 puffs into the lungs every 6 (six) hours as needed for wheezing or shortness of breath. 1 Inhaler 3  . beclomethasone (QVAR) 80 MCG/ACT inhaler Inhale 2 puffs into the lungs daily. 1 Inhaler 12  . etonogestrel (NEXPLANON) 68 MG IMPL implant Inject into the skin.    . fluticasone (FLONASE) 50 MCG/ACT nasal spray Place 2 sprays into both nostrils daily. 16 g 6  . ondansetron (ZOFRAN ODT) 4 MG disintegrating tablet Take 1 tablet (4 mg total) by mouth every 8 (eight) hours as needed for nausea or vomiting. 20 tablet 0    Allergies:  No Known Allergies  Physical Exam:  Temp:  [98.1 F (36.7 C)] 98.1 F (36.7 C) (02/12 0615) Pulse Rate:  [116-137] 127 (02/12 1200) Resp:  [13-32] 28 (02/12 1200) BP: (117-129)/(74-90) 120/74 (02/12 1200) SpO2:  [98 %-100 %] 100 % (02/12 1200) Weight:  [54.4 kg (120 lb)] 54.4 kg (120 lb) (02/12 0617)   General Appearance:  Well  developed, well nourished, no acute distress, alert and oriented, cooperative and appears stated age 68:  Normocephalic atraumatic, extraocular movements intact, moist mucous membranes, neck supple with midline trachea and thyroid without masses Cardiovascular:  Normal S1/S2, regular rate and rhythm, no murmurs, 2+ distal pulses Pulmonary:  clear to auscultation, no wheezes, rales or rhonchi, symmetric air entry, good air exchange Abdomen:  Bowel sounds present, soft, nontender, nondistended, no abnormal masses or organomegaly, no epigastric pain Back: inspection  of back is normal Extremities:  extremities normal, no tenderness, atraumatic, no cyanosis or edema Skin:  normal coloration and turgor, no rashes, no suspicious skin lesions noted  Neurologic:  Cranial nerves 2-12 grossly intact, grossly equal strength and muscle tone, normal speech, no focal findings or movement disorder noted. Psychiatric:  Normal mood and affect, appropriate, no AH/VH Pelvic:  Deferred as just performed by ED MD  Labs/Studies:   CBC and Coags:  Lab Results  Component Value Date   WBC 13.8 (H) 02/06/2017   HGB 13.7 02/06/2017   HCT 38.8 02/06/2017   MCV 84.7 02/06/2017   PLT 345 02/06/2017   CMP:  Lab Results  Component Value Date   NA 136 02/06/2017   K 3.9 02/06/2017   CL 106 02/06/2017   CO2 22 02/06/2017   BUN 6 02/06/2017   CREATININE 0.73 02/06/2017   CREATININE 0.72 02/03/2017   CREATININE 0.62 01/28/2017   PROT 7.2 02/06/2017   BILITOT 0.9 02/06/2017   ALT 17 02/06/2017   AST 22 02/06/2017   ALKPHOS 51 02/06/2017   Other Labs: Results for orders placed or performed during the hospital encounter of 02/06/17 (from the past 48 hour(s))  Lipase, blood     Status: None   Collection Time: 02/06/17  6:36 AM  Result Value Ref Range   Lipase 20 11 - 51 U/L  Comprehensive metabolic panel     Status: Abnormal   Collection Time: 02/06/17  6:36 AM  Result Value Ref Range   Sodium 136 135 - 145 mmol/L   Potassium 3.9 3.5 - 5.1 mmol/L    Comment: HEMOLYSIS AT THIS LEVEL MAY AFFECT RESULT   Chloride 106 101 - 111 mmol/L   CO2 22 22 - 32 mmol/L   Glucose, Bld 96 65 - 99 mg/dL   BUN 6 6 - 20 mg/dL   Creatinine, Ser 0.73 0.44 - 1.00 mg/dL   Calcium 8.1 (L) 8.9 - 10.3 mg/dL   Total Protein 7.2 6.5 - 8.1 g/dL   Albumin 3.6 3.5 - 5.0 g/dL   AST 22 15 - 41 U/L   ALT 17 14 - 54 U/L   Alkaline Phosphatase 51 38 - 126 U/L   Total Bilirubin 0.9 0.3 - 1.2 mg/dL   GFR calc non Af Amer >60 >60 mL/min   GFR calc Af Amer >60 >60 mL/min    Comment: (NOTE) The  eGFR has been calculated using the CKD EPI equation. This calculation has not been validated in all clinical situations. eGFR's persistently <60 mL/min signify possible Chronic Kidney Disease.    Anion gap 8 5 - 15  CBC     Status: Abnormal   Collection Time: 02/06/17  6:36 AM  Result Value Ref Range   WBC 13.8 (H) 3.6 - 11.0 K/uL   RBC 4.59 3.80 - 5.20 MIL/uL   Hemoglobin 13.7 12.0 - 16.0 g/dL   HCT 38.8 35.0 - 47.0 %   MCV 84.7 80.0 - 100.0 fL   MCH 29.9 26.0 - 34.0  pg   MCHC 35.3 32.0 - 36.0 g/dL   RDW 12.8 11.5 - 14.5 %   Platelets 345 150 - 440 K/uL  hCG, quantitative, pregnancy     Status: None   Collection Time: 02/06/17  6:36 AM  Result Value Ref Range   hCG, Beta Chain, Quant, S 1 <5 mIU/mL    Comment:          GEST. AGE      CONC.  (mIU/mL)   <=1 WEEK        5 - 50     2 WEEKS       50 - 500     3 WEEKS       100 - 10,000     4 WEEKS     1,000 - 30,000     5 WEEKS     3,500 - 115,000   6-8 WEEKS     12,000 - 270,000    12 WEEKS     15,000 - 220,000        FEMALE AND NON-PREGNANT FEMALE:     LESS THAN 5 mIU/mL   Wet prep, genital     Status: Abnormal   Collection Time: 02/06/17  7:42 AM  Result Value Ref Range   Yeast Wet Prep HPF POC NONE SEEN NONE SEEN   Trich, Wet Prep NONE SEEN NONE SEEN   Clue Cells Wet Prep HPF POC NONE SEEN NONE SEEN   WBC, Wet Prep HPF POC MODERATE (A) NONE SEEN   Sperm NONE SEEN   Chlamydia/NGC rt PCR ([facility] only)     Status: Abnormal   Collection Time: 02/06/17  7:42 AM  Result Value Ref Range   Specimen source GC/Chlam ENDOCERVICAL    Chlamydia Tr DETECTED (A) NOT DETECTED   N gonorrhoeae NOT DETECTED NOT DETECTED    Comment: (NOTE) 100  This methodology has not been evaluated in pregnant women or in 200  patients with a history of hysterectomy. 300 400  This methodology will not be performed on patients less than 47  years of age.   Urinalysis, Complete w Microscopic     Status: Abnormal   Collection Time: 02/06/17  9:35 AM   Result Value Ref Range   Color, Urine STRAW (A) YELLOW   APPearance CLEAR (A) CLEAR   Specific Gravity, Urine 1.003 (L) 1.005 - 1.030   pH 6.0 5.0 - 8.0   Glucose, UA NEGATIVE NEGATIVE mg/dL   Hgb urine dipstick NEGATIVE NEGATIVE   Bilirubin Urine NEGATIVE NEGATIVE   Ketones, ur 20 (A) NEGATIVE mg/dL   Protein, ur NEGATIVE NEGATIVE mg/dL   Nitrite NEGATIVE NEGATIVE   Leukocytes, UA TRACE (A) NEGATIVE   RBC / HPF 0-5 0 - 5 RBC/hpf   WBC, UA 6-30 0 - 5 WBC/hpf   Bacteria, UA NONE SEEN NONE SEEN   Squamous Epithelial / LPF 0-5 (A) NONE SEEN   Mucous PRESENT      Other Imaging: US Transvaginal Non-ob  Result Date: 02/06/2017 CLINICAL DATA:  Right lower quadrant pain since 01/27/2017 EXAM: TRANSABDOMINAL AND TRANSVAGINAL ULTRASOUND OF PELVIS DOPPLER ULTRASOUND OF OVARIES TECHNIQUE: Both transabdominal and transvaginal ultrasound examinations of the pelvis were performed. Transabdominal technique was performed for global imaging of the pelvis including uterus, ovaries, adnexal regions, and pelvic cul-de-sac. It was necessary to proceed with endovaginal exam following the transabdominal exam to visualize the ovaries. Color and duplex Doppler ultrasound was utilized to evaluate blood flow to the ovaries. COMPARISON:  Ultrasound dated  01/28/2017 and CT scan dated 01/28/2017 FINDINGS: Uterus Measurements: 6.3 x 2.5 x 4.1 cm. No fibroids or other mass visualized. Endometrium Thickness: 5 mm.  No focal abnormality visualized. Right ovary Measurements: 5.2 x 3.7 x 2.7 cm. Normal appearance/no adnexal mass. The collapsing cyst seen on the prior study has resolved. Left ovary Measurements: 2.7 x 4.3 x 2.7 cm. Normal appearance/no adnexal mass. Pulsed Doppler evaluation of both ovaries demonstrates normal low-resistance arterial and venous waveforms. Other findings Trace free fluid in the pelvis, normal. IMPRESSION: Normal pelvic ultrasound with normal perfusion to both ovaries. The collapsing cyst on the  right ovary has resolved since the prior ultrasound. Electronically Signed   By: Lorriane Shire M.D.   On: 02/06/2017 09:14   US Transvaginal Non-ob  Result Date: 01/28/2017 CLINICAL DATA:  Right lower quadrant pain. EXAM: TRANSABDOMINAL AND TRANSVAGINAL ULTRASOUND OF PELVIS DOPPLER ULTRASOUND OF OVARIES TECHNIQUE: Both transabdominal and transvaginal ultrasound examinations of the pelvis were performed. Transabdominal technique was performed for global imaging of the pelvis including uterus, ovaries, adnexal regions, and pelvic cul-de-sac. It was necessary to proceed with endovaginal exam following the transabdominal exam to visualize the endometrium and ovaries. Color and duplex Doppler ultrasound was utilized to evaluate blood flow to the ovaries. COMPARISON:  CT abdomen/ pelvis earlier this day. FINDINGS: Uterus Measurements: 6.4 x 2.5 x 4.0 cm. No fibroids or other mass visualized. Endometrium Thickness: 6.2 mm. No focal abnormality visualized. Right ovary Measurements: 4.5 x 2.3 x 2.7 cm. Collapsing corpus luteal cyst measures 2.7 cm. There is normal blood flow. No adnexal mass. Left ovary Measurements: 2.7 x 3.7 x 2.6 cm. Normal appearance/no adnexal mass. Normal blood flow. Pulsed Doppler evaluation of both ovaries demonstrates normal low-resistance arterial and venous waveforms. Other findings Small volume free fluid is physiologic. IMPRESSION: 1. Normal blood flow to both ovaries, no ovarian torsion. 2. Collapsing corpus luteal cyst in the right ovary, physiologic, likely the cause of patient's pain. Electronically Signed   By: Jeb Levering M.D.   On: 01/28/2017 05:39   Ct Abdomen Pelvis W Contrast  Result Date: 01/28/2017 CLINICAL DATA:  Right lower quadrant pain and nausea. EXAM: CT ABDOMEN AND PELVIS WITH CONTRAST TECHNIQUE: Multidetector CT imaging of the abdomen and pelvis was performed using the standard protocol following bolus administration of intravenous contrast. CONTRAST:  175m  ISOVUE-300 IOPAMIDOL (ISOVUE-300) INJECTION 61% COMPARISON:  None. FINDINGS: Lower chest: Lung bases are clear. Hepatobiliary: No focal liver abnormality is seen. No gallstones, gallbladder wall thickening, or biliary dilatation. Pancreas: No ductal dilatation or inflammation. Spleen: Normal in size without focal abnormality. Splenules anteriorly. Adrenals/Urinary Tract: Adrenal glands are unremarkable. Kidneys are normal, without renal calculi, focal lesion, or hydronephrosis. Bladder is unremarkable. Stomach/Bowel: Stomach is within normal limits. Appendix appears normal. No evidence of bowel wall thickening, distention, or inflammatory changes. Moderate stool burden throughout the colon. Vascular/Lymphatic: No significant vascular findings are present. No enlarged abdominal or pelvic lymph nodes. Reproductive: Peripherally enhancing corpus luteal cyst in the right ovary. Left ovary is normal in size. Uterus is unremarkable. Trace free fluid in the pelvis is physiologic. Other: No free air or intra- abdominal abscess. Tiny fat containing umbilical hernia. Musculoskeletal: There are no acute or suspicious osseous abnormalities. Scattered bone islands in the pelvis and proximal femora. IMPRESSION: 1. Normal appendix. 2. Corpus luteal cyst in the right ovary, physiologic, but likely cause of patient's pain. Electronically Signed   By: MJeb LeveringM.D.   On: 01/28/2017 03:48   [place]Art/ven Flow Abd  Pelv Doppler  Result Date: 02/06/2017 CLINICAL DATA:  Right lower quadrant pain since 01/27/2017 EXAM: TRANSABDOMINAL AND TRANSVAGINAL ULTRASOUND OF PELVIS DOPPLER ULTRASOUND OF OVARIES TECHNIQUE: Both transabdominal and transvaginal ultrasound examinations of the pelvis were performed. Transabdominal technique was performed for global imaging of the pelvis including uterus, ovaries, adnexal regions, and pelvic cul-de-sac. It was necessary to proceed with endovaginal exam following the transabdominal exam to  visualize the ovaries. Color and duplex Doppler ultrasound was utilized to evaluate blood flow to the ovaries. COMPARISON:  Ultrasound dated 01/28/2017 and CT scan dated 01/28/2017 FINDINGS: Uterus Measurements: 6.3 x 2.5 x 4.1 cm. No fibroids or other mass visualized. Endometrium Thickness: 5 mm.  No focal abnormality visualized. Right ovary Measurements: 5.2 x 3.7 x 2.7 cm. Normal appearance/no adnexal mass. The collapsing cyst seen on the prior study has resolved. Left ovary Measurements: 2.7 x 4.3 x 2.7 cm. Normal appearance/no adnexal mass. Pulsed Doppler evaluation of both ovaries demonstrates normal low-resistance arterial and venous waveforms. Other findings Trace free fluid in the pelvis, normal. IMPRESSION: Normal pelvic ultrasound with normal perfusion to both ovaries. The collapsing cyst on the right ovary has resolved since the prior ultrasound. Electronically Signed   By: Lorriane Shire M.D.   On: 02/06/2017 09:14    Dg Abd 2 Views  Result Date: 02/03/2017 CLINICAL DATA:  Constipation, nausea, vomiting EXAM: ABDOMEN - 2 VIEW COMPARISON:  None. FINDINGS: Moderate amount of stool throughout the colon. There is no bowel dilatation to suggest obstruction. There is no evidence of pneumoperitoneum, portal venous gas or pneumatosis. There are no pathologic calcifications along the expected course of the ureters. The osseous structures are unremarkable. IMPRESSION: There is a moderate amount of stool throughout the colon. Electronically Signed   By: Kathreen Devoid   On: 02/03/2017 17:55     Assessment / Plan:   Angela Bryan is a 26 y.o. G0 who presents with pelvic pain and +chlamydia with tachycardia, leukocytosis and has met SIRS criteria, with ED requesting admission for PID.  1. Antibiotics: IV for the first 24 hours with expected improvement - clinda 900q8h and gent q24h. Will also treat with 1g Azitromycin for specific clindamycin treatment. 2. IVF @ 125  3. Reg diet 4. Ambulate as  tolerated 5. Anticipate discharge tomorrow with outpatient treatment x 14 days.  Thank you for the opportunity to be involved with this patient's care.  ----- Larey Days, MD Attending Obstetrician and Gynecologist [facility], Department of [facility]

## 2017-02-06 NOTE — Progress Notes (Addendum)
Pharmacy Antibiotic Note  Angela Bryan is a 26 y.o. female admitted on 02/06/2017 with Pelvic inflammatory disease.  Pharmacy has been consulted for Gentamicin dosing.  Plan: Gentamicin 5mg /kg daily (extended interval dosing) for PID. Will draw a random gentamicin level 12 hours after dose and will assess based on hartford nomogram and will re-dose as appropriate. Will continue to monitor renal function w/ Scr ordered 2/13 w/ am labs.  2/12 2327 gentamicin random <0.5. Continue Q24H dosing. Szymon Foiles A. [place], [place].D., BCPS  Height: 5\' 2"  (157.5 cm) Weight: 120 lb (54.4 kg) IBW/kg (Calculated) : 50.1  Temp (24hrs), Avg:98.1 F (36.7 C), Min:98.1 F (36.7 C), Max:98.1 F (36.7 C)   Recent Labs Lab 02/03/17 1601 02/06/17 0636  WBC 11.4* 13.8*  CREATININE 0.72 0.73    Estimated Creatinine Clearance: 85 mL/min (by C-G formula based on SCr of 0.73 mg/dL).    No Known Allergies  Antimicrobials this admission: 2/12 Clinda >>  2/12 gentamicin >>   Dose adjustments this admission:   Microbiology results: 2/12 wet prep: WBC moderate 2/12 chlamydia: positive 2/12 Ucx: NG  Thank you for allowing pharmacy to be a part of this patient's care.  Thomasene Rippleavid Besanti, PharmD, BCPS Clinical Pharmacist 02/06/2017

## 2017-02-06 NOTE — ED Triage Notes (Signed)
Pt arrives ambulatory to triage with c/o of lower right sided abdominal pain. Pt [facility] Pt reports that this past Friday the xray revealed no traces of the cyst and was given laxatives this past Friday and since that time she has had diarrhea. Pt is in NAD at this time.

## 2017-02-06 NOTE — ED Notes (Signed)
Pt presents with c/o right sided abd pain and fever; called her primary on call and was told to come in for evaluation; pt instructed not to eat or drink until seen by provider; verbalized understanding; ambulatory with steady gait

## 2017-02-06 NOTE — ED Provider Notes (Signed)
[facility] Emergency Department Provider Note   ____________________________________________   First MD Initiated Contact with Patient 02/06/17 [phone]     (approximate)  I have reviewed the triage vital signs and the nursing notes.   HISTORY  Chief Complaint Abdominal Pain   HPI Angela Bryan is a 26 y.o. female reports no significant medical history except asthma  Angela Bryan week ago she began experiencing pain in her right lower abdomen, associated with nausea and poor appetite. She's felt like she's had occasional chills and fever to about "100" at home. She was seen in the ER, had a CAT scan and ultrasound was told she had a cyst on the right ovary. A few days ago she had vaginal bleeding, which she describes as slightly more than a heavy menstrual cycle, and this has gone away. Her pain in the right lower abdomen has steadily increased. She reports very tender to touch in the right lower abdomen.  No vaginal discharge, except for her menstrual cycle. Denies any abnormal or follow vaginal odor. Denies pregnancy.  Reports moderate, right lower abdominal pain.  Past Medical History:  Diagnosis Date  . Allergy   . Asthma     There are no active problems to display for this patient.   History reviewed. No pertinent surgical history.  Prior to Admission medications   Medication Sig Start Date End Date Taking? Authorizing Provider  albuterol (VENTOLIN HFA) 108 (90 BASE) MCG/ACT inhaler Inhale 2 puffs into the lungs every 6 (six) hours as needed for wheezing or shortness of breath. 04/27/15   Angela Brooks, MD  beclomethasone (QVAR) 80 MCG/ACT inhaler Inhale 2 puffs into the lungs daily. 04/27/15   Angela Brooks, MD  etonogestrel (NEXPLANON) 68 MG IMPL implant Inject into the skin.    Historical Provider, MD  fluticasone (FLONASE) 50 MCG/ACT nasal spray Place 2 sprays into both nostrils daily. 04/27/15   Angela Brooks, MD  ondansetron (ZOFRAN ODT) 4 MG  disintegrating tablet Take 1 tablet (4 mg total) by mouth every 8 (eight) hours as needed for nausea or vomiting. 02/03/17   Angela Scarlet, MD    Allergies Patient has no known allergies.  No family history on file.  Social History Social History  Substance Use Topics  . Smoking status: Never Smoker  . Smokeless tobacco: Never Used  . Alcohol use Yes     Comment: occasional    Review of Systems Constitutional: See history of present illness Eyes: No visual changes. ENT: No sore throat. Cardiovascular: Denies chest pain. Respiratory: Denies shortness of breath. Gastrointestinal: See history of present illness. She was told by her doctor she might be constipated, and took laxatives and had a few loose stools, but these have gone away.  Genitourinary: Negative for dysuria. Musculoskeletal: Negative for back pain. Skin: Negative for rash. Neurological: Negative for headaches, focal weakness or numbness.  10-point ROS otherwise negative.  ____________________________________________   PHYSICAL EXAM:  VITAL SIGNS: ED Triage Vitals  Enc Vitals Group     BP 02/06/17 0615 119/83     Pulse Rate 02/06/17 0615 (!) 137     Resp 02/06/17 0615 13     Temp 02/06/17 0615 98.1 F (36.7 C)     Temp Source 02/06/17 0615 Oral     SpO2 02/06/17 0615 100 %     Weight 02/06/17 0617 120 lb (54.4 kg)     Height 02/06/17 0617 5\' 2"  (1.575 m)     Head Circumference --  Peak Flow --      Pain Score 02/06/17 0617 9     Pain Loc --      Pain Edu? --      Excl. in GC? --     Constitutional: Alert and oriented. Well appearing and in no acute distress.Both the patient and her mother are very pleasant. Eyes: Conjunctivae are normal. PERRL. EOMI. Head: Atraumatic. Nose: No congestion/rhinnorhea. Mouth/Throat: Mucous membranes are moist.  Oropharynx non-erythematous. Neck: No stridor.   Cardiovascular: Tachycardic rate, regular rhythm. Grossly normal heart sounds.  Good peripheral  circulation. Respiratory: Normal respiratory effort.  No retractions. Lungs CTAB. Gastrointestinal: Soft and mildly tender throughout, focal right lower quadrant tenderness is notable at McBurney's point. She has rebound peritonitis, exhibits a positive Rovsing and psoas sign. No distention. No abdominal bruits. No CVA tenderness. Genitourinary: Pelvic exam performed with nurse Angela Bryan as well as nursing student Angela Bryan present throughout. Normal external exam. Internal exam appears normal, except the cervix demonstrates a somewhat thick greenish appearing discharge. Low in volume. She has minimal tenderness cervical motion discomfort , but in the adnexa to digital exam, she has notable tenderness in the right adnexa Musculoskeletal: No lower extremity tenderness nor edema.  No joint effusions. Neurologic:  Normal speech and language. No gross focal neurologic deficits are appreciated.  Skin:  Skin is warm, dry and intact. No rash noted. Psychiatric: Mood and affect are normal. Speech and behavior are normal.  ____________________________________________   LABS (all labs ordered are listed, but only abnormal results are displayed)  Labs Reviewed  WET PREP, GENITAL - Abnormal; Notable for the following:       Result Value   WBC, Wet Prep HPF POC MODERATE (*)    All other components within normal limits  CHLAMYDIA/NGC RT PCR ([facility] ONLY) - Abnormal; Notable for the following:    Chlamydia Tr DETECTED (*)    All other components within normal limits  COMPREHENSIVE METABOLIC PANEL - Abnormal; Notable for the following:    Calcium 8.1 (*)    All other components within normal limits  CBC - Abnormal; Notable for the following:    WBC 13.8 (*)    All other components within normal limits  URINALYSIS, COMPLETE (UACMP) WITH MICROSCOPIC - Abnormal; Notable for the following:    Color, Urine STRAW (*)    APPearance CLEAR (*)    Specific Gravity, Urine 1.003 (*)    Ketones, ur 20 (*)     Leukocytes, UA TRACE (*)    Squamous Epithelial / LPF 0-5 (*)    All other components within normal limits  CULTURE, BLOOD (ROUTINE X 2)  CULTURE, BLOOD (ROUTINE X 2)  LIPASE, BLOOD  HCG, QUANTITATIVE, PREGNANCY   ____________________________________________  EKG  ED ECG REPORT I, QUALE, MARK, the attending physician, personally viewed and interpreted this ECG.  Date: 02/06/2017 EKG Time: 630am Rate: 130 Rhythm: Sinus tachycardia  QRS Axis: normal Intervals: normal ST/T Wave abnormalities: normal Conduction Disturbances: none Narrative Interpretation: Sinus tachycardia  ____________________________________________  RADIOLOGY  [place] Transvaginal Non-ob  Result Date: 02/06/2017 CLINICAL DATA:  Right lower quadrant pain since 01/27/2017 EXAM: TRANSABDOMINAL AND TRANSVAGINAL ULTRASOUND OF PELVIS DOPPLER ULTRASOUND OF OVARIES TECHNIQUE: Both transabdominal and transvaginal ultrasound examinations of the pelvis were performed. Transabdominal technique was performed for global imaging of the pelvis including uterus, ovaries, adnexal regions, and pelvic cul-de-sac. It was necessary to proceed with endovaginal exam following the transabdominal exam to visualize the ovaries. Color and duplex Doppler ultrasound was utilized to evaluate blood  flow to the ovaries. COMPARISON:  Ultrasound dated 01/28/2017 and CT scan dated 01/28/2017 FINDINGS: Uterus Measurements: 6.3 x 2.5 x 4.1 cm. No fibroids or other mass visualized. Endometrium Thickness: 5 mm.  No focal abnormality visualized. Right ovary Measurements: 5.2 x 3.7 x 2.7 cm. Normal appearance/no adnexal mass. The collapsing cyst seen on the prior study has resolved. Left ovary Measurements: 2.7 x 4.3 x 2.7 cm. Normal appearance/no adnexal mass. Pulsed Doppler evaluation of both ovaries demonstrates normal low-resistance arterial and venous waveforms. Other findings Trace free fluid in the pelvis, normal. IMPRESSION: Normal pelvic ultrasound with  normal perfusion to both ovaries. The collapsing cyst on the right ovary has resolved since the prior ultrasound. Electronically Signed   By: Francene Boyers M.D.   On: 02/06/2017 09:14   US Pelvis Complete  Result Date: 02/06/2017 CLINICAL DATA:  Right lower quadrant pain since 01/27/2017 EXAM: TRANSABDOMINAL AND TRANSVAGINAL ULTRASOUND OF PELVIS DOPPLER ULTRASOUND OF OVARIES TECHNIQUE: Both transabdominal and transvaginal ultrasound examinations of the pelvis were performed. Transabdominal technique was performed for global imaging of the pelvis including uterus, ovaries, adnexal regions, and pelvic cul-de-sac. It was necessary to proceed with endovaginal exam following the transabdominal exam to visualize the ovaries. Color and duplex Doppler ultrasound was utilized to evaluate blood flow to the ovaries. COMPARISON:  Ultrasound dated 01/28/2017 and CT scan dated 01/28/2017 FINDINGS: Uterus Measurements: 6.3 x 2.5 x 4.1 cm. No fibroids or other mass visualized. Endometrium Thickness: 5 mm.  No focal abnormality visualized. Right ovary Measurements: 5.2 x 3.7 x 2.7 cm. Normal appearance/no adnexal mass. The collapsing cyst seen on the prior study has resolved. Left ovary Measurements: 2.7 x 4.3 x 2.7 cm. Normal appearance/no adnexal mass. Pulsed Doppler evaluation of both ovaries demonstrates normal low-resistance arterial and venous waveforms. Other findings Trace free fluid in the pelvis, normal. IMPRESSION: Normal pelvic ultrasound with normal perfusion to both ovaries. The collapsing cyst on the right ovary has resolved since the prior ultrasound. Electronically Signed   By: Francene Boyers M.D.   On: 02/06/2017 09:14   [place] Art/ven Flow Abd Pelv Doppler  Result Date: 02/06/2017 CLINICAL DATA:  Right lower quadrant pain since 01/27/2017 EXAM: TRANSABDOMINAL AND TRANSVAGINAL ULTRASOUND OF PELVIS DOPPLER ULTRASOUND OF OVARIES TECHNIQUE: Both transabdominal and transvaginal ultrasound examinations of the  pelvis were performed. Transabdominal technique was performed for global imaging of the pelvis including uterus, ovaries, adnexal regions, and pelvic cul-de-sac. It was necessary to proceed with endovaginal exam following the transabdominal exam to visualize the ovaries. Color and duplex Doppler ultrasound was utilized to evaluate blood flow to the ovaries. COMPARISON:  Ultrasound dated 01/28/2017 and CT scan dated 01/28/2017 FINDINGS: Uterus Measurements: 6.3 x 2.5 x 4.1 cm. No fibroids or other mass visualized. Endometrium Thickness: 5 mm.  No focal abnormality visualized. Right ovary Measurements: 5.2 x 3.7 x 2.7 cm. Normal appearance/no adnexal mass. The collapsing cyst seen on the prior study has resolved. Left ovary Measurements: 2.7 x 4.3 x 2.7 cm. Normal appearance/no adnexal mass. Pulsed Doppler evaluation of both ovaries demonstrates normal low-resistance arterial and venous waveforms. Other findings Trace free fluid in the pelvis, normal. IMPRESSION: Normal pelvic ultrasound with normal perfusion to both ovaries. The collapsing cyst on the right ovary has resolved since the prior ultrasound. Electronically Signed   By: Francene Boyers M.D.   On: 02/06/2017 09:14    ____________________________________________   PROCEDURES  Procedure(s) performed: None  Procedures  Critical Care performed: Yes, see critical care note(s)  CRITICAL CARE Performed  bySharyn Creamer   Total critical care time: 40 minutes  Critical care time was exclusive of separately billable procedures and treating other patients.  Critical care was necessary to treat or prevent imminent or life-threatening deterioration.  Critical care was time spent personally by me on the following activities: development of treatment plan with patient and/or surrogate as well as nursing, discussions with consultants, evaluation of patient's response to treatment, examination of patient, obtaining history from patient or surrogate,  ordering and performing treatments and interventions, ordering and review of laboratory studies, ordering and review of radiographic studies, pulse oximetry and re-evaluation of patient's condition.  Patient diagnosed with pelvic inflammatory disease, persistent tachycardia, given second liter of fluid, 2 IV antibiotics including clindamycin and gentamicin as recommended by Dr. Elesa Massed. Patient meeting sepsis criteria, including leukocytosis, heart rate greater than 120 ____________________________________________   INITIAL IMPRESSION / ASSESSMENT AND PLAN / ED COURSE  Pertinent labs & imaging results that were available during my care of the patient were reviewed by me and considered in my medical decision making (see chart for details).  Persistent right lower quadrant abdominal pain. Gynecologic exam concerning for a green purulent discharge. Patient is sexually active  Clinical Course as of Feb 06 1017  Mon Feb 06, 2017  0801 Reeval, asked again, and patient does not wish for any pain or antiemetic medications.   [MQ]    Clinical Course User Index [MQ] Sharyn Creamer, MD   Discussed with Dr. Elesa Massed, advises antibiotic treatment as above. I discussed and canceled the patient's CT, her pain with gynecologic exam and positive chlamydia highly suggestive of pelvic inflammatory disease. Recent CT with normal appendix. Discussed with Dr. Elesa Massed, patient will be monitored serial exams and follow-up with antibiotics.  ____________________________________________   FINAL CLINICAL IMPRESSION(S) / ED DIAGNOSES  Final diagnoses:  Pelvic inflammatory disease  Chlamydia  Sepsis, due to unspecified organism [facility])      NEW MEDICATIONS STARTED DURING THIS VISIT:  New Prescriptions   No medications on file     Note:  This document was prepared using Dragon voice recognition software and may include unintentional dictation errors.     Sharyn Creamer, MD 02/06/17 1021

## 2017-02-07 LAB — CREATININE, SERUM
CREATININE: 0.7 mg/dL (ref 0.44–1.00)
GFR calc non Af Amer: >60 mL/min (ref >60)

## 2017-02-07 LAB — CBC
HCT: 34.2 % — ABNORMAL LOW (ref 35.0–47.0)
Hemoglobin: 11.5 g/dL — ABNORMAL LOW (ref 12.0–16.0)
MCH: 29.2 pg (ref 26.0–34.0)
MCHC: 33.7 g/dL (ref 32.0–36.0)
MCV: 86.7 fL (ref 80.0–100.0)
PLATELETS: 278 10*3/uL (ref 150–440)
RBC: 3.95 MIL/uL (ref 3.80–5.20)
RDW: 13 % (ref 11.5–14.5)
WBC: 10 10*3/uL (ref 3.6–11.0)

## 2017-02-07 LAB — GENTAMICIN LEVEL, RANDOM

## 2017-02-07 MED ORDER — ONDANSETRON 4 MG PO TBDP: 4.0000 mg | ORAL_TABLET | Freq: Three times a day (TID) | ORAL | 0 refills | Status: DC | PRN

## 2017-02-07 MED ORDER — AZITHROMYCIN 500 MG PO TABS: 1000.0000 mg | ORAL_TABLET | Freq: Once | ORAL | 0 refills | Status: AC

## 2017-02-07 MED ORDER — METRONIDAZOLE 500 MG PO TABS: 500.0000 mg | ORAL_TABLET | Freq: Two times a day (BID) | ORAL | 0 refills | Status: DC

## 2017-02-07 MED ORDER — DOXYCYCLINE MONOHYDRATE 100 MG PO TABS: 100.0000 mg | ORAL_TABLET | Freq: Two times a day (BID) | ORAL | 0 refills | Status: DC

## 2017-02-07 NOTE — Discharge Instructions (Signed)
Pelvic Inflammatory Disease °Introduction °Pelvic inflammatory disease (PID) is an infection in some or all of the female organs. PID can be in the uterus, ovaries, fallopian tubes, or the surrounding tissues that are inside the lower belly area (pelvis). PID can lead to lasting problems if it is not treated. To check for this disease, your doctor may: °· Do a physical exam. °· Do blood tests, urine tests, or a pregnancy test. °· Look at your vaginal discharge. °· Do tests to look inside the pelvis. °· Test you for other infections. °Follow these instructions at home: °· Take over-the-counter and prescription medicines only as told by your doctor. °· If you were prescribed an antibiotic medicine, take it as told by your doctor. Do not stop taking it even if you start to feel better. °· Do not have sex until treatment is done or as told by your doctor. °· Tell your sex partner if you have PID. Your partner may need to be treated. °· Keep all follow-up visits as told by your doctor. This is important. °· Your doctor may test you for infection again 3 months after you are treated. °Contact a doctor if: °· You have more fluid (discharge) coming from your vagina or fluid that is not normal. °· Your pain does not improve. °· You throw up (vomit). °· You have a fever. °· You cannot take your medicines. °· Your partner has a sexually transmitted disease (STD). °· You have pain when you pee (urinate). °Get help right away if: °· You have more belly (abdominal) or lower belly pain. °· You have chills. °· You are not better after 72 hours. °This information is not intended to replace advice given to you by your health care provider. Make sure you discuss any questions you have with your health care provider. °Document Released: 03/10/2009 Document Revised: 05/19/2016 Document Reviewed: 01/19/2015 °© 2017 Elsevier ° °

## 2017-02-07 NOTE — Progress Notes (Signed)
Discharge order received from doctor. Reviewed discharge instructions and prescriptions with patient and answered all questions. Follow up appointment instructions given. Patient verbalized understanding. Patient discharged home via wheelchair by nursing/auxillary.     Jaizon Deroos Garner, RN  

## 2017-02-07 NOTE — Discharge Summary (Signed)
Gynecology Physician Postoperative Discharge Summary  Patient ID: Angela Bryan MRN: [id number] DOB/AGE: [birth year] 25 y.o.  Admit Date: 02/06/2017 Discharge Date: 02/07/2017  Admission Diagnoses: pelvic inflammatory disease Discharge Diagnoses: same  Procedures: Ultrasound, antibiotics  CBC Latest Ref Rng & Units 02/07/2017 02/06/2017 02/03/2017  WBC 3.6 - 11.0 K/uL 10.0 13.8(H) 11.4(H)  Hemoglobin 12.0 - 16.0 g/dL 11.5(L) 13.7 12.9  Hematocrit 35.0 - 47.0 % 34.2(L) 38.8 37.8  Platelets 150 - 440 K/uL 278 345 291    Hospital Course:  Angela Bryan is a 26 y.o. G0 admitted from the ED with SIRS criteria and acute pelvic inflammatory disease.  She was +chlamydia with tachycardia, leukocytosis, and had mucopurulent discharge from her cervical os with + CMT.  She was afebrile and remained such throughout her stay.  She received 24hrs of IV antibiotics and her nausea/pain improved. She was deemed stable for discharge to home.  Discharge Exam: Blood pressure 114/72, pulse 95, temperature 99.1 F (37.3 C), temperature source Oral, resp. rate 18, height 5\' 2"  (1.575 m), weight 54.4 kg (120 lb), last menstrual period 01/31/2017, SpO2 100 %. General appearance: alert and no distress  Resp: clear to auscultation bilaterally, normal respiratory effort Cardio: regular rate and rhythm  GI: soft, non-tender; bowel sounds normal; no masses, no organomegaly.  Extremities: extremities normal, atraumatic, no cyanosis or edema and Homans sign is negative, no sign of DVT  Discharged Condition: Stable  Disposition: 01-Home or Self Care   Allergies as of 02/07/2017   No Known Allergies     Medication List    TAKE these medications   acetaminophen 500 MG tablet Commonly known as:  TYLENOL Take 500 mg by mouth every 6 (six) hours as needed.   albuterol 108 (90 Base) MCG/ACT inhaler Commonly known as:  VENTOLIN HFA Inhale 2 puffs into the lungs every 6 (six) hours as needed for wheezing or  shortness of breath.   azithromycin 500 MG tablet Commonly known as:  ZITHROMAX Take 2 tablets (1,000 mg total) by mouth once.   beclomethasone 80 MCG/ACT inhaler Commonly known as:  QVAR Inhale 2 puffs into the lungs daily.   doxycycline 100 MG tablet Commonly known as:  ADOXA Take 1 tablet (100 mg total) by mouth 2 (two) times daily.   etonogestrel 68 MG Impl implant Commonly known as:  NEXPLANON Inject into the skin.   fluticasone 50 MCG/ACT nasal spray Commonly known as:  FLONASE Place 2 sprays into both nostrils daily.   metroNIDAZOLE 500 MG tablet Commonly known as:  FLAGYL Take 1 tablet (500 mg total) by mouth 2 (two) times daily.   naproxen sodium 220 MG tablet Commonly known as:  ANAPROX Take 220 mg by mouth 2 (two) times daily with a meal.   ondansetron 4 MG disintegrating tablet Commonly known as:  ZOFRAN ODT Take 1 tablet (4 mg total) by mouth every 8 (eight) hours as needed for nausea or vomiting.      Follow-up Information    Elenora Fenderhelsea C Marcial Pless, MD Follow up in 1 month(s).   Specialty:  Obstetrics and Gynecology Why:  check cultures Contact information: [address] [facility] [place] [id number] [phone]           Signed:  Elenora Fenderhelsea C Tameca Jerez Attending Obstetrician & Gynecologist [facility]

## 2017-02-11 LAB — CULTURE, BLOOD (ROUTINE X 2)
Culture: NO GROWTH
Culture: NO GROWTH

## 2017-02-23 ENCOUNTER — Ambulatory Visit (INDEPENDENT_AMBULATORY_CARE_PROVIDER_SITE_OTHER): Payer: BC Managed Care – PPO | Admitting: Physician Assistant

## 2017-02-23 ENCOUNTER — Encounter: Payer: Self-pay | Admitting: Family Medicine

## 2017-02-23 ENCOUNTER — Encounter: Payer: Self-pay | Admitting: Physician Assistant

## 2017-02-23 ENCOUNTER — Ambulatory Visit: Payer: BC Managed Care – PPO | Admitting: Physician Assistant

## 2017-02-23 VITALS — BP 110/82 | HR 104 | Temp 98.1°F | Resp 16 | Wt 129.2 lb

## 2017-02-23 DIAGNOSIS — B084 Enteroviral vesicular stomatitis with exanthem: Secondary | ICD-10-CM

## 2017-02-23 MED ORDER — MAGIC MOUTHWASH W/LIDOCAINE: 5.0000 mL | Freq: Four times a day (QID) | ORAL | 0 refills | Status: DC | PRN

## 2017-02-24 NOTE — Progress Notes (Signed)
Patient ID: Angela Bryan MRN: [id number], DOB: 29-Mar-1991, 26 y.o. Date of Encounter: 02/24/2017, 8:21 AM    Chief Complaint:  Chief Complaint  Patient presents with  . bumps on hand     HPI: 26 y.o. year old white female presents with above.   Says she just noticed this bump on palm of hand today. Starting to notice a few small spots on fingers--these are so small, cna barely see them, can feel them when she rubs hand over that area. Spots are somewhat painful. Has noticed no other areas of bumps/ rash.  Has noticed no other symptoms but says her grandfather just died and she has been at the hospital etc and "really hasnt paid attention to herself past couple of days"--exhausted from staying in the hospital and the emotional stress etc--has not noticed runny nose, cough, fever---is tired but doesn't know if related to this "illness" or death of grandfather.  Works as Engineer, site. Coworker told her to come check for HandFoot Mouth.     Home Meds:   Outpatient Medications Prior to Visit  Medication Sig Dispense Refill  . acetaminophen (TYLENOL) 500 MG tablet Take 500 mg by mouth every 6 (six) hours as needed.    Marland Kitchen albuterol (VENTOLIN HFA) 108 (90 BASE) MCG/ACT inhaler Inhale 2 puffs into the lungs every 6 (six) hours as needed for wheezing or shortness of breath. 1 Inhaler 3  . beclomethasone (QVAR) 80 MCG/ACT inhaler Inhale 2 puffs into the lungs daily. 1 Inhaler 12  . etonogestrel (NEXPLANON) 68 MG IMPL implant Inject into the skin.    . fluticasone (FLONASE) 50 MCG/ACT nasal spray Place 2 sprays into both nostrils daily. 16 g 6  . naproxen sodium (ANAPROX) 220 MG tablet Take 220 mg by mouth 2 (two) times daily with a meal.    . ondansetron (ZOFRAN ODT) 4 MG disintegrating tablet Take 1 tablet (4 mg total) by mouth every 8 (eight) hours as needed for nausea or vomiting. 20 tablet 0  . doxycycline (ADOXA) 100 MG tablet Take 1 tablet (100 mg total) by mouth 2 (two) times daily.  (Patient not taking: Reported on 02/23/2017) 26 tablet 0  . metroNIDAZOLE (FLAGYL) 500 MG tablet Take 1 tablet (500 mg total) by mouth 2 (two) times daily. (Patient not taking: Reported on 02/23/2017) 26 tablet 0   No facility-administered medications prior to visit.     Allergies: No Known Allergies    Review of Systems: See HPI for pertinent ROS. All other ROS negative.    Physical Exam: Blood pressure 110/82, pulse (!) 104, temperature 98.1 F (36.7 C), temperature source Oral, resp. rate 16, weight 129 lb 3.2 oz (58.6 kg), last menstrual period 01/31/2017, SpO2 98 %., Body mass index is 23.63 kg/m. General:  WNWD WF. Appears in no acute distress. HEENT: Normocephalic, atraumatic, eyes without discharge, sclera non-icteric, nares are without discharge. Bilateral auditory canals clear, TM's are without perforation, pearly grey and translucent with reflective cone of light bilaterally. Oral cavity moist, posterior pharynx without exudate, erythema, peritonsillar abscess. No skin lesions on oral mucosa or pharynx, palate etc.  Neck: Supple. No thyromegaly. No lymphadenopathy. Lungs: Clear bilaterally to auscultation without wheezes, rales, or rhonchi. Breathing is unlabored. Heart: Regular rhythm. No murmurs, rubs, or gallops. Msk:  Strength and tone normal for age. Extremities/Skin: She has papule on palm of hand. She has 3 tiny ~ 1mm papules on fingers.  No skin lesions on skin or in web spaces b/t fingers No lesions  in mouth No lesions on plantar surface of feet Neuro: Alert and oriented X 3. Moves all extremities spontaneously. Gait is normal. CNII-XII grossly in tact. Psych:  Responds to questions appropriately with a normal affect.     ASSESSMENT AND PLAN:  26 y.o. year old female with  1. Hand, foot and mouth disease Discussed that this is most likely HandFootMouth. If develops painful lesions in mouth, use Magic MouthWash. Discussed that this will run its course and resolve on  its own. If develops lesions in other areas of body other than mouth, hands feet, then f/u. If develops additioanl symptoms, f/u. Note given for oow tomorrow, which is a Friday.  - magic mouthwash w/lidocaine SOLN; Take 5 mLs by mouth 4 (four) times daily as needed for mouth pain.  Dispense: 120 mL; Refill: 0   Signed, [address], [place], [facility] 02/24/2017 8:21 AM

## 2017-08-24 IMAGING — US US PELVIS COMPLETE
1 series · 13 of 25 positions shown · non-contrast
Comparison: CT abdomen/ pelvis earlier this day.

CLINICAL DATA: Right lower quadrant pain.

EXAM:
TRANSABDOMINAL AND TRANSVAGINAL ULTRASOUND OF PELVIS
DOPPLER ULTRASOUND OF OVARIES
TECHNIQUE: Both transabdominal and transvaginal ultrasound examinations of the
pelvis were performed. Transabdominal technique was performed for
global imaging of the pelvis including uterus, ovaries, adnexal
regions, and pelvic cul-de-sac.
It was necessary to proceed with endovaginal exam following the
transabdominal exam to visualize the endometrium and ovaries. Color
and duplex Doppler ultrasound was utilized to evaluate blood flow to
the ovaries.

[Series 1: us pelvis complete · 0.24mm/px · 81 acquisitions, 13 frames shown]
[im 1/81]
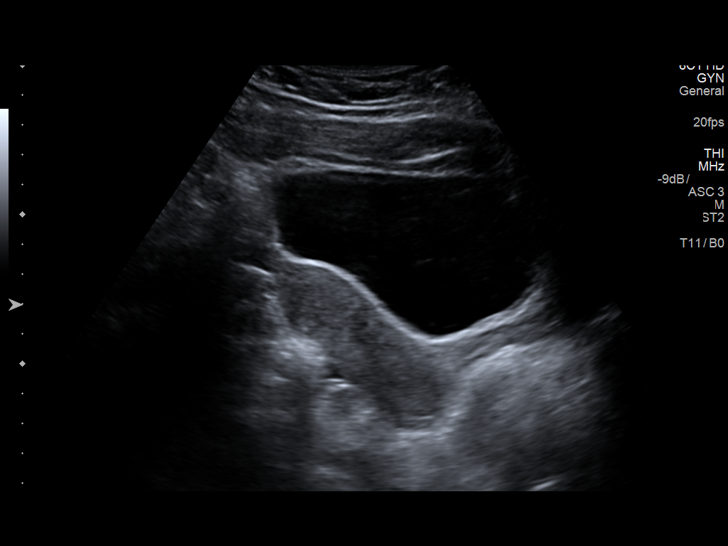
[im 7/81]
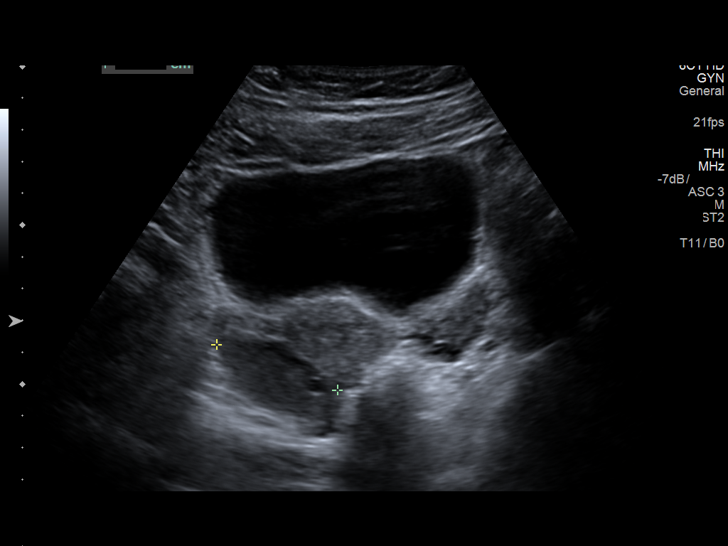
[im 14/81]
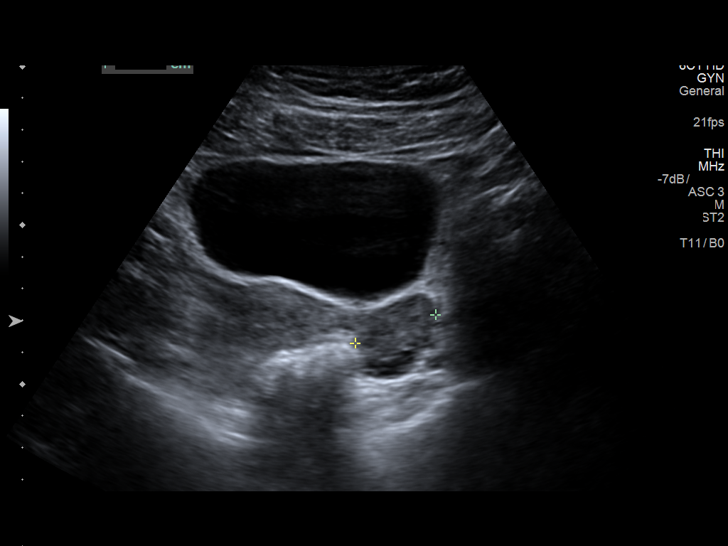
[im 21/81]
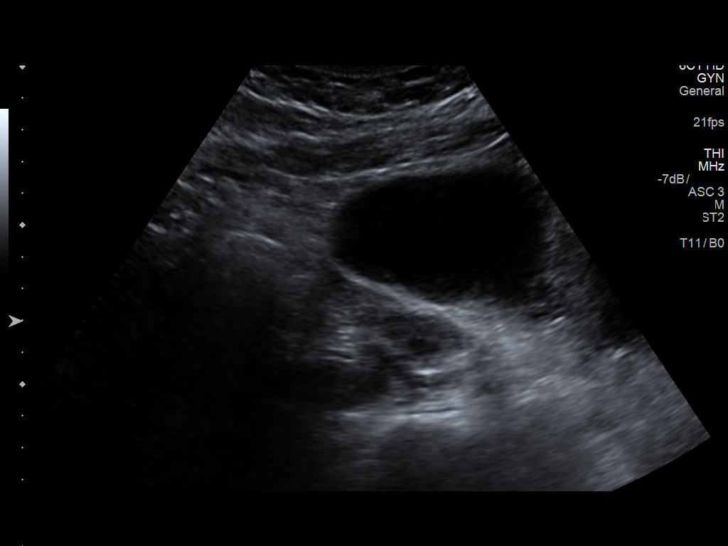
[im 27/81]
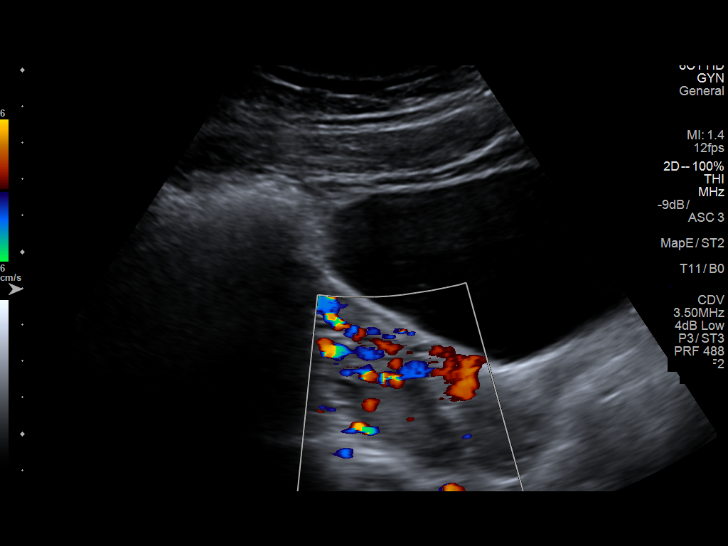
[im 34/81]
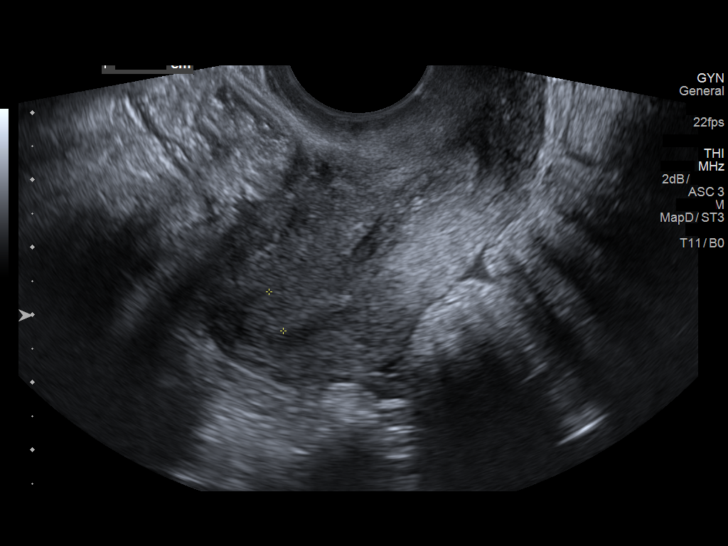
[im 41/81]
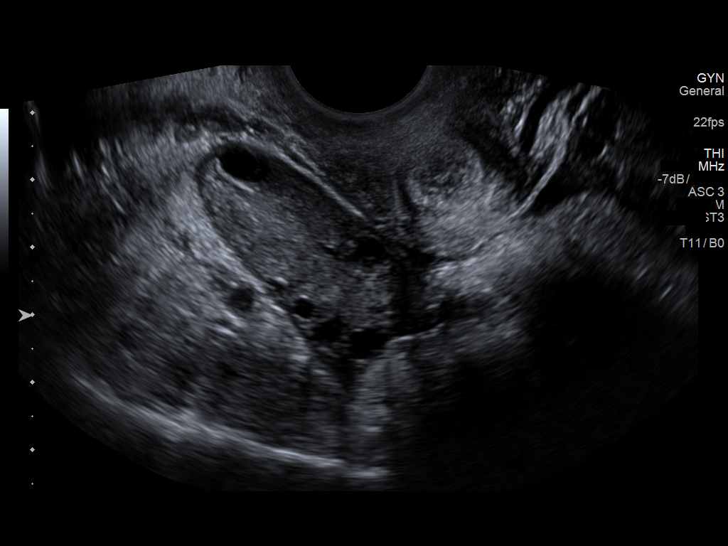
[im 47/81]
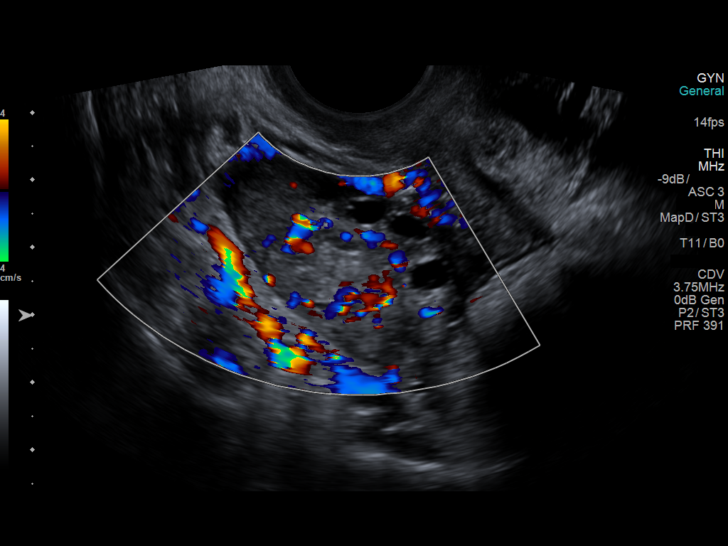
[im 54/81]
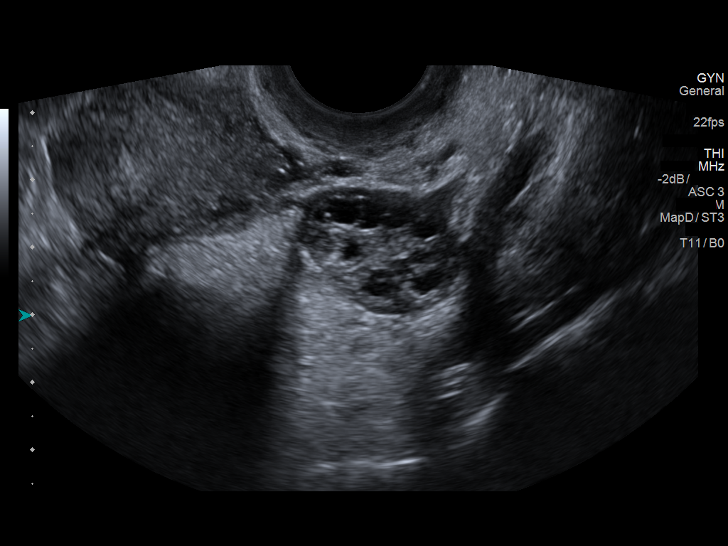
[im 61/81]
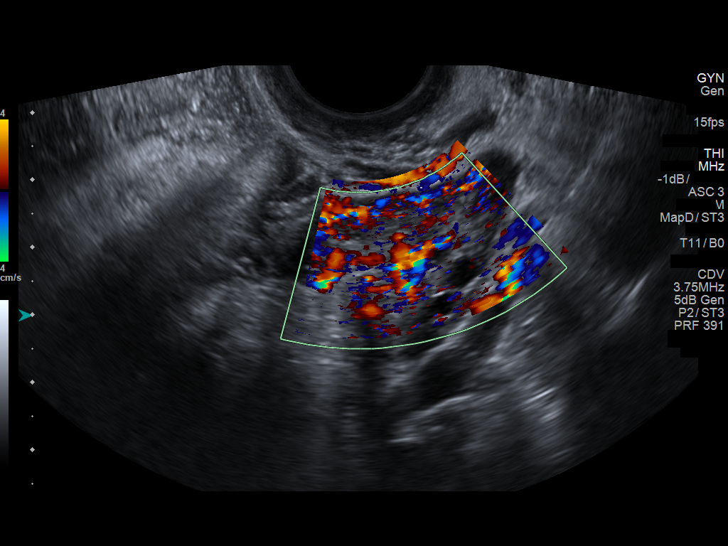
[im 67/81]
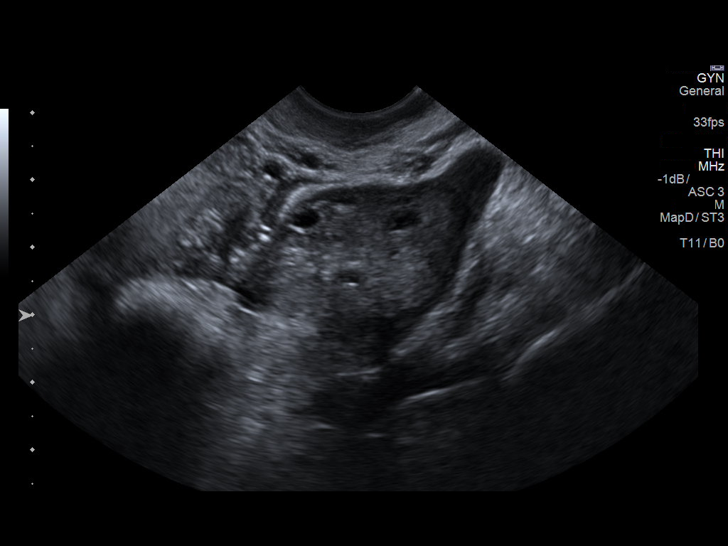
[im 74/81]
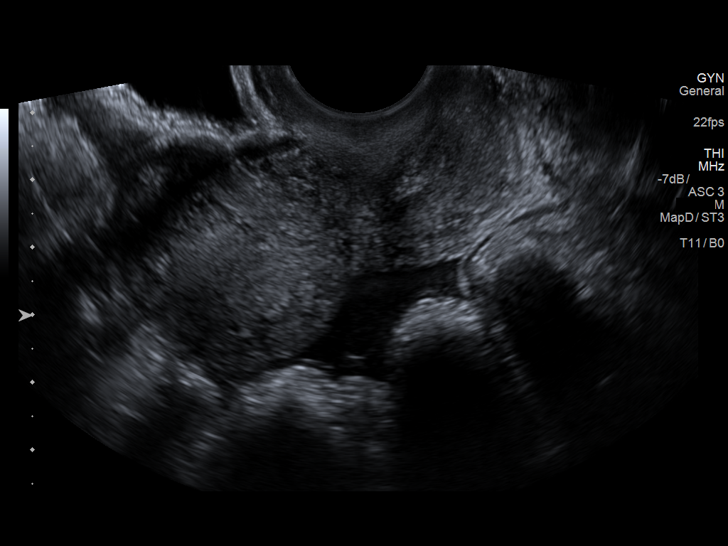
[im 81/81]
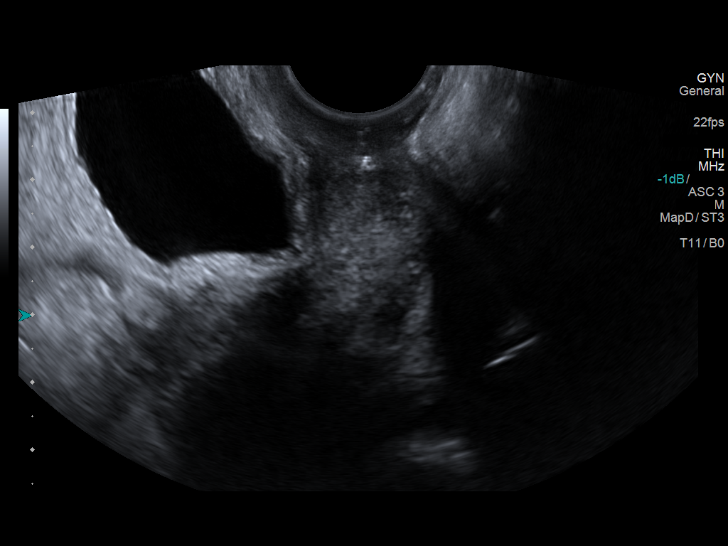

[13 of 25 positions shown; findings below may reference images not displayed]

FINDINGS: Uterus

Measurements: 6.4 x 2.5 x 4.0 cm. No fibroids or other mass
visualized.

Endometrium

Thickness: 6.2 mm. No focal abnormality visualized.

Right ovary

Measurements: 4.5 x 2.3 x 2.7 cm. Collapsing corpus luteal cyst
measures 2.7 cm. There is normal blood flow. No adnexal mass.

Left ovary

Measurements: 2.7 x 3.7 x 2.6 cm. Normal appearance/no adnexal mass.
Normal blood flow.

Pulsed Doppler evaluation of both ovaries demonstrates normal
low-resistance arterial and venous waveforms.

Other findings

Small volume free fluid is physiologic.
IMPRESSION: 1. Normal blood flow to both ovaries, no ovarian torsion.
2. Collapsing corpus luteal cyst in the right ovary, physiologic,
likely the cause of patient's pain.

## 2017-09-02 IMAGING — US US PELVIS COMPLETE
1 series · 13 of 25 positions shown · non-contrast
Comparison: Ultrasound dated 01/28/2017 and CT scan dated
01/28/2017

CLINICAL DATA: Right lower quadrant pain since 01/27/2017

EXAM:
TRANSABDOMINAL AND TRANSVAGINAL ULTRASOUND OF PELVIS
DOPPLER ULTRASOUND OF OVARIES
TECHNIQUE: Both transabdominal and transvaginal ultrasound examinations of the
pelvis were performed. Transabdominal technique was performed for
global imaging of the pelvis including uterus, ovaries, adnexal
regions, and pelvic cul-de-sac.
It was necessary to proceed with endovaginal exam following the
transabdominal exam to visualize the ovaries. Color and duplex
Doppler ultrasound was utilized to evaluate blood flow to the
ovaries.

[Series 1: us pelvis complete · 0.21mm/px · 13 of 112 slices shown]
[im 1/112]
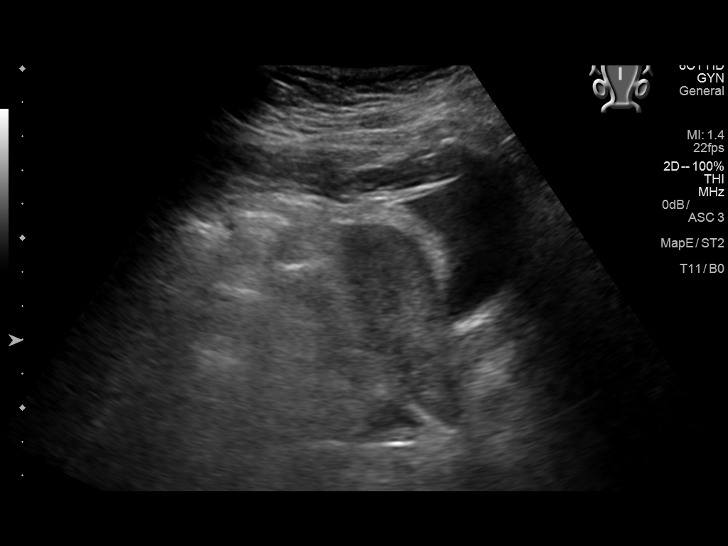
[im 10/112]
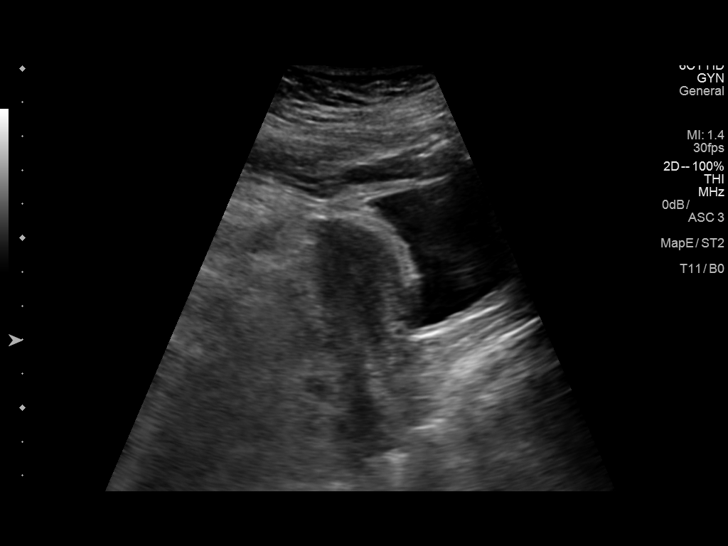
[im 19/112]
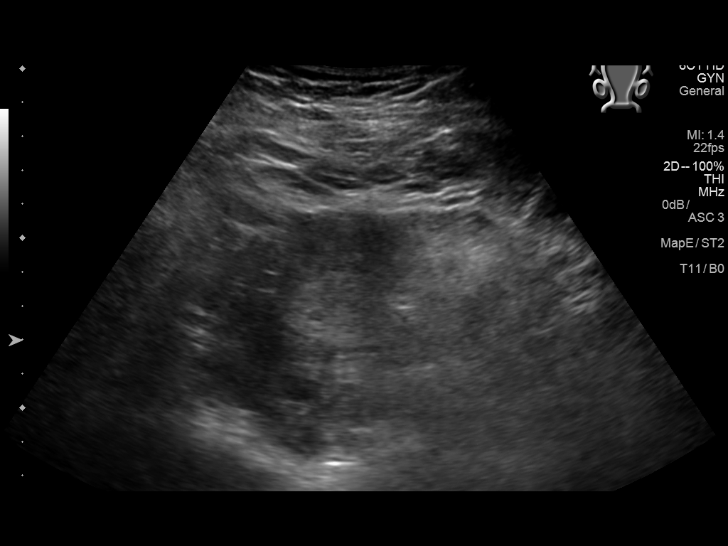
[im 28/112]
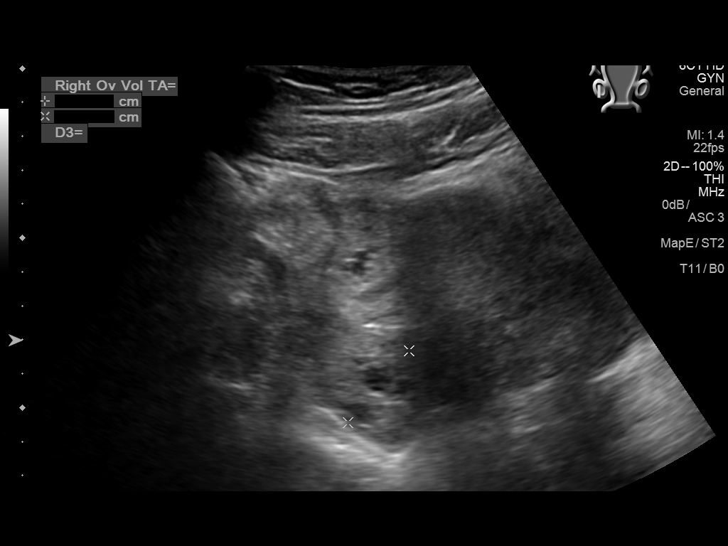
[im 38/112]
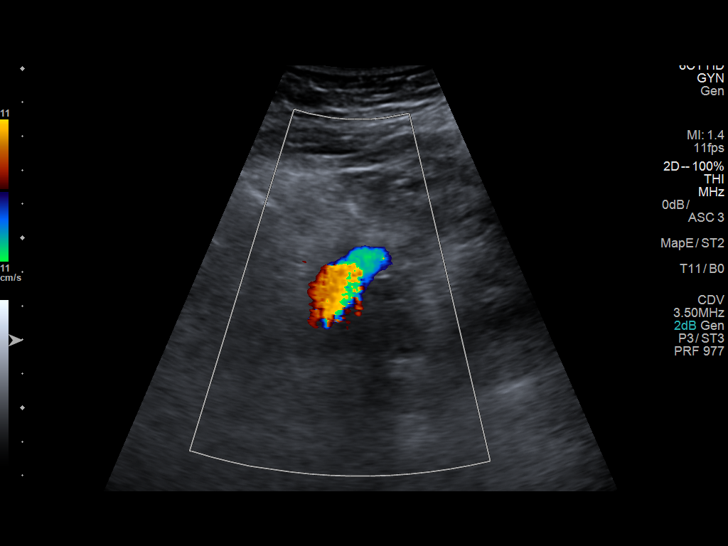
[im 47/112]
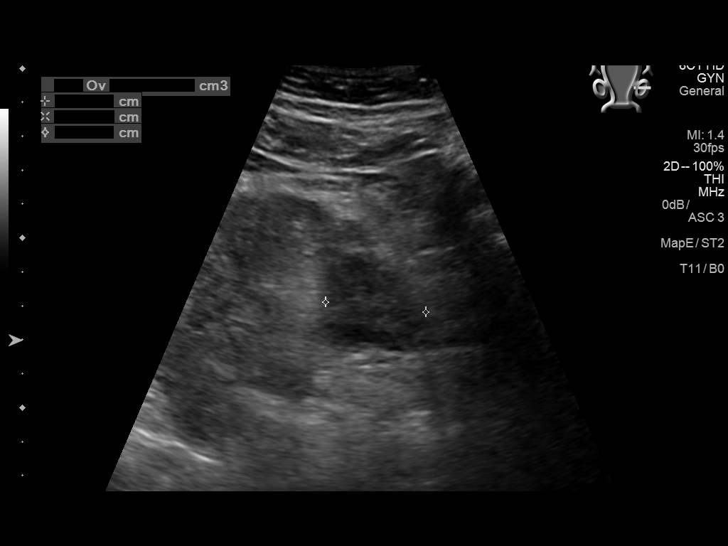
[im 56/112]
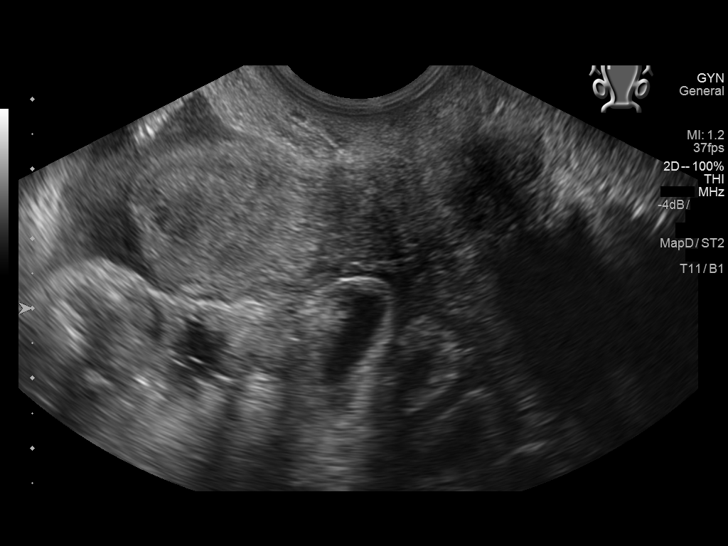
[im 65/112]
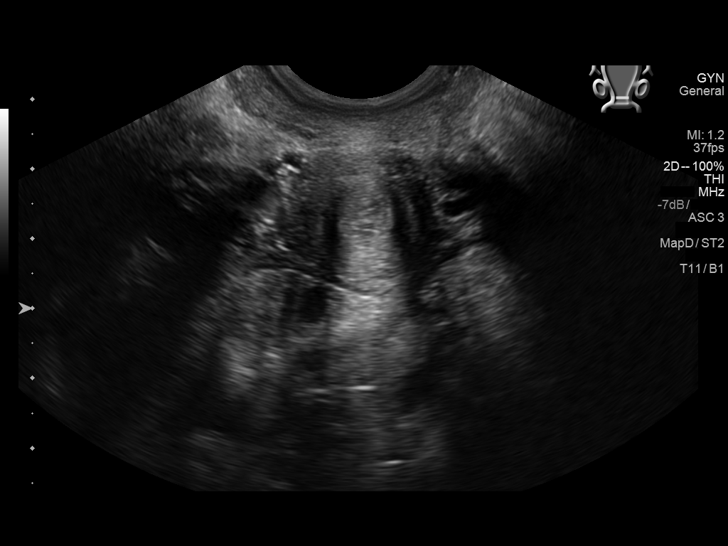
[im 75/112]
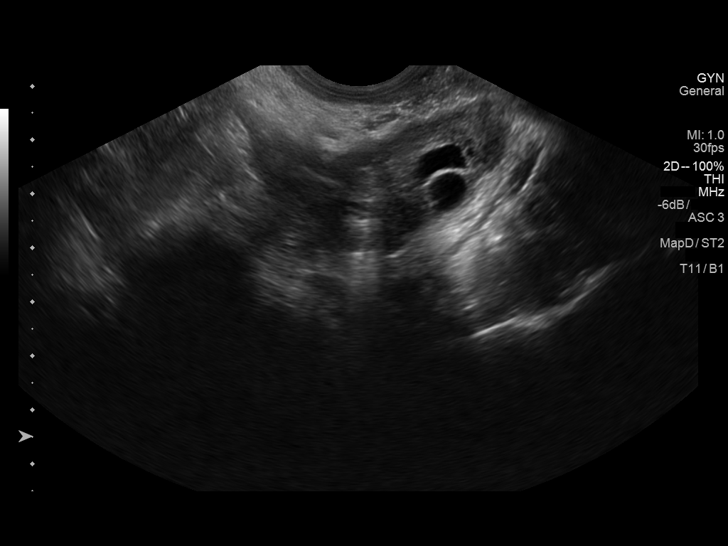
[im 84/112]
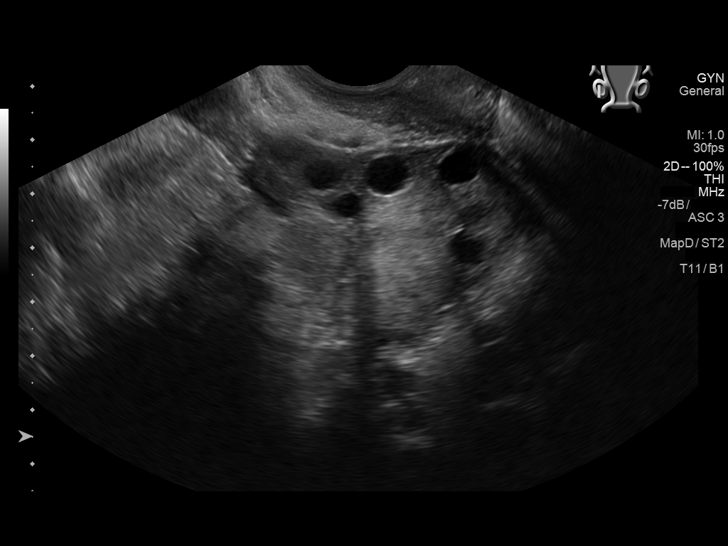
[im 93/112]
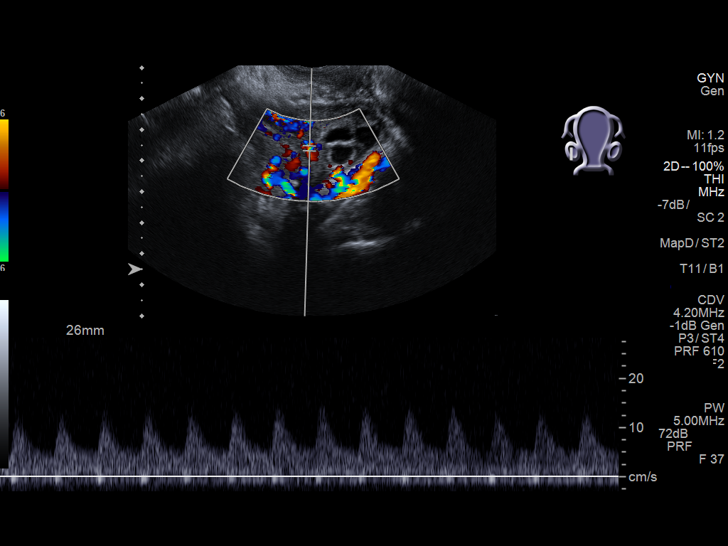
[im 102/112]
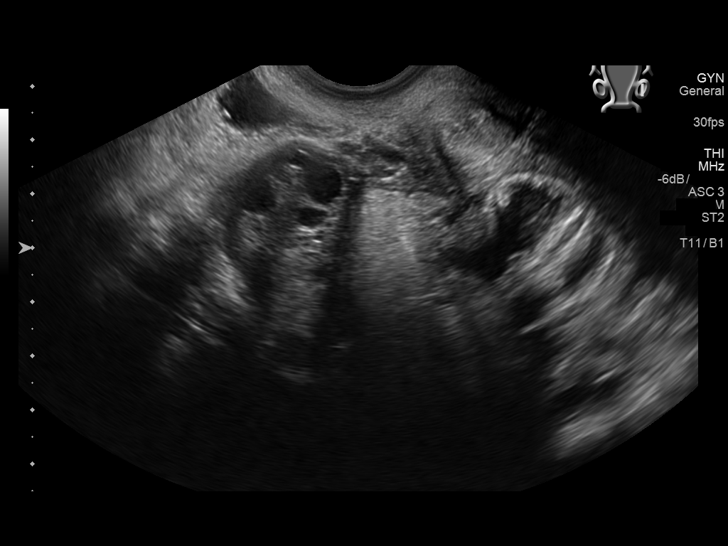
[im 112/112]
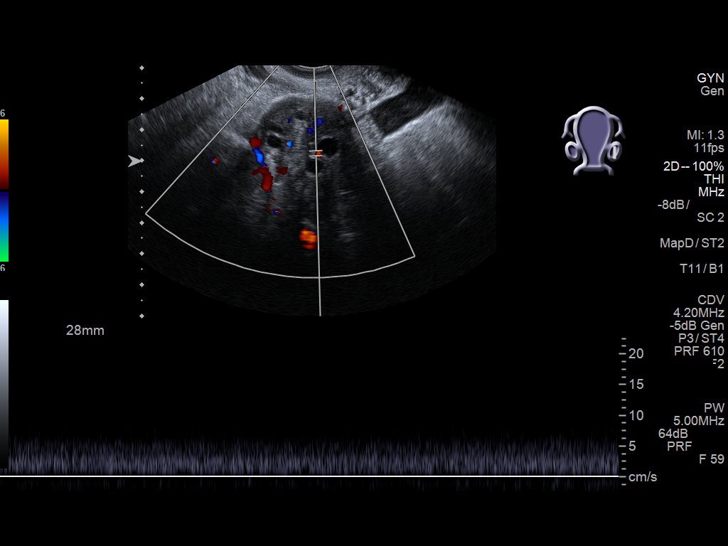

[13 of 25 positions shown; findings below may reference images not displayed]

FINDINGS: Uterus

Measurements: 6.3 x 2.5 x 4.1 cm.. No fibroids or other mass
visualized.

Endometrium

Thickness: 5 mm.  No focal abnormality visualized.

Right ovary

Measurements: 5.2 x 3.7 x 2.7 cm. Normal appearance/no adnexal mass.
The collapsing cyst seen on the prior study has resolved.

Left ovary

Measurements: 2.7 x 4.3 x 2.7 cm. Normal appearance/no adnexal mass.

Pulsed Doppler evaluation of both ovaries demonstrates normal
low-resistance arterial and venous waveforms.

Other findings

Trace free fluid in the pelvis, normal.
IMPRESSION: Normal pelvic ultrasound with normal perfusion to both ovaries. The
collapsing cyst on the right ovary has resolved since the prior
ultrasound.

## 2018-01-23 ENCOUNTER — Encounter: Payer: Self-pay | Admitting: Family Medicine

## 2018-01-23 ENCOUNTER — Ambulatory Visit: Payer: BC Managed Care – PPO | Admitting: Family Medicine

## 2018-01-23 DIAGNOSIS — J208 Acute bronchitis due to other specified organisms: Secondary | ICD-10-CM | POA: Diagnosis not present

## 2018-01-23 MED ORDER — ALBUTEROL SULFATE HFA 108 (90 BASE) MCG/ACT IN AERS: 2.0000 | INHALATION_SPRAY | Freq: Four times a day (QID) | RESPIRATORY_TRACT | 3 refills | Status: DC | PRN

## 2018-01-23 MED ORDER — FLUTICASONE PROPIONATE 50 MCG/ACT NA SUSP: 2.0000 | Freq: Every day | NASAL | 6 refills | Status: DC

## 2018-01-23 MED ORDER — BENZONATATE 100 MG PO CAPS: 200.0000 mg | ORAL_CAPSULE | Freq: Three times a day (TID) | ORAL | 0 refills | Status: DC | PRN

## 2018-01-23 MED ORDER — AZITHROMYCIN 250 MG PO TABS: ORAL_TABLET | ORAL | 0 refills | Status: DC

## 2018-01-23 NOTE — Progress Notes (Signed)
Subjective:    Patient ID: Angela Bryan, female    DOB: Apr 15, 1991, 26 y.o.   MRN: [id number]  HPI  Symptoms began last week with fever, cough productive of green sputum, body aches.  Symptoms have gradually improved over the last week.  Patient is concerned because her cough persist.  She has a constant dry nonproductive cough.  She denies any fevers or chills.  She denies any shortness of breath although she is having to use her albuterol more frequently.  Typically her asthma is well controlled.  She is on no daily preventative medication.  Patient discontinued Qvar 5 months ago.  In the last 5 months she has not had to use her albuterol despite stopping the Qvar.  However over the last week, she is having to use albuterol almost every day.  On exam today however there is no audible wheezing.  No increased expiratory phase, and no shortness of breath or accessory muscle use.  Last albuterol was 4 hours ago.   Past Medical History:  Diagnosis Date  . Allergy   . Asthma    No past surgical history on file. Current Outpatient Medications on File Prior to Visit  Medication Sig Dispense Refill  . acetaminophen (TYLENOL) 500 MG tablet Take 500 mg by mouth every 6 (six) hours as needed.    . beclomethasone (QVAR) 80 MCG/ACT inhaler Inhale 2 puffs into the lungs daily. 1 Inhaler 12  . etonogestrel (NEXPLANON) 68 MG IMPL implant Inject into the skin.     No current facility-administered medications on file prior to visit.    No Known Allergies Social History   Socioeconomic History  . Marital status: Single    Spouse name: Not on file  . Number of children: Not on file  . Years of education: Not on file  . Highest education level: Not on file  Social Needs  . Financial resource strain: Not on file  . Food insecurity - worry: Not on file  . Food insecurity - inability: Not on file  . Transportation needs - medical: Not on file  . Transportation needs - non-medical: Not on file    Occupational History  . Not on file  Tobacco Use  . Smoking status: Never Smoker  . Smokeless tobacco: Never Used  Substance and Sexual Activity  . Alcohol use: Yes    Comment: occasional  . Drug use: No  . Sexual activity: Not on file  Other Topics Concern  . Not on file  Social History Narrative  . Not on file     Review of Systems  All other systems reviewed and are negative.      Objective:   Physical Exam  Constitutional: She appears well-developed and well-nourished.  HENT:  Right Ear: External ear normal.  Left Ear: External ear normal.  Nose: Nose normal.  Mouth/Throat: Oropharynx is clear and moist. No oropharyngeal exudate.  Neck: Neck supple.  Cardiovascular: Normal rate, regular rhythm and normal heart sounds.  Pulmonary/Chest: Effort normal. No respiratory distress. She has no wheezes. She has no rales.  Lymphadenopathy:    She has no cervical adenopathy.  Vitals reviewed.         Assessment & Plan:  Viral bronchitis - Plan: fluticasone (FLONASE) 50 MCG/ACT nasal spray, albuterol (VENTOLIN HFA) 108 (90 Base) MCG/ACT inhaler  I believe the patient has a viral bronchitis.  I have recommended continued tincture of time.  She can use Tessalon Perles 200 mg every 8 hours as needed for  cough.  She can use albuterol 2 puffs every 4-6 hours as needed for wheezing.  Use Flonase for postnasal drip and head congestion.  At the present time her asthma is well controlled despite not taking a daily preventative medication.  We discussed indications to resume Qvar in the future however at the present time I believe her asthma is well controlled simply using albuterol on an as-needed basis.  I did give the patient a prescription for a Z-Pak with instructions not to fill unless symptoms worsen.  If she develops purulent sputum, new fever, worsening shortness of breath, she is to fill the Z-Pak.  If her wheezing worsens, she will need a prednisone taper pack.

## 2018-03-28 ENCOUNTER — Encounter: Payer: Self-pay | Admitting: Physician Assistant

## 2018-03-28 ENCOUNTER — Ambulatory Visit: Payer: BC Managed Care – PPO | Admitting: Physician Assistant

## 2018-03-28 VITALS — BP 124/84 | HR 110 | Temp 98.3°F | Resp 16 | Ht 62.0 in | Wt 140.8 lb

## 2018-03-28 DIAGNOSIS — R197 Diarrhea, unspecified: Secondary | ICD-10-CM

## 2018-03-28 DIAGNOSIS — G43A0 Cyclical vomiting, not intractable: Secondary | ICD-10-CM | POA: Diagnosis not present

## 2018-03-28 MED ORDER — PROMETHAZINE HCL 25 MG PO TABS: 25.0000 mg | ORAL_TABLET | Freq: Four times a day (QID) | ORAL | 0 refills | Status: DC | PRN

## 2018-03-28 NOTE — Progress Notes (Signed)
Patient ID: Angela Bryan MRN: [id number], DOB: 1991/06/16, 27 y.o. Date of Encounter: @DATE @  Chief Complaint:  Chief Complaint  Patient presents with  . Nausea    since 3/16  . Emesis  . Diarrhea    HPI: 27 y.o. year old female  presents with above.    She reports that the symptoms have been off and on.  Says that she has symptoms about 3 or 4 times per week since March 16. Says that she has had episodes where she will vomit for about 2 hours straight. Then at other times she will have diarrhea all throughout the day but have no vomiting that day. Between these "episodes" she will have days where she feels fine.   Says that she will be okay for a couple of days and then will have a day of either vomiting episode or diarrhea. Says that specifically she remembers that 2 weekends ago they went to [place].   Says that on that Friday night she started vomiting and then on Saturday morning she sat in the car and vomited for 4 hours straight.  Had 1 or 2 episodes of diarrhea that morning but otherwise no additional diarrhea--- just the vomiting. Says that over this period of time she has missed 2 days of work and has missed some family events etc. Says that yesterday she had diarrhea all day but had no vomiting.  This morning she felt a little nauseous but otherwise has felt okay today. Says that in general since this has been going on, she has been eating things like chicken noodle soup and sandwich "with just meat and cheese".  Always drinks water and no soda --says that she has stopped the soda.  States that prior to this whole episode starting March 16, she had no prior history of problems with diarrhea or GI issues.  States that she has no household contacts that are sick.  Does work as a Runner, broadcasting/film/video but has not been exposed to any specific illness that would explain her symptoms that she is aware of.  She has been on no recent antibiotics.  Says that she had PID 1 year ago but has  had no significant issues since then.  She has had no fever. She has had no significant abdominal pain.  Through all of this-- has had no localized/focal abdominal pain.  Just diffuse crampy discomfort at times.    Past Medical History:  Diagnosis Date  . Allergy   . Asthma      Home Meds: Outpatient Medications Prior to Visit  Medication Sig Dispense Refill  . acetaminophen (TYLENOL) 500 MG tablet Take 500 mg by mouth every 6 (six) hours as needed.    Marland Kitchen albuterol (VENTOLIN HFA) 108 (90 Base) MCG/ACT inhaler Inhale 2 puffs into the lungs every 6 (six) hours as needed for wheezing or shortness of breath. 1 Inhaler 3  . beclomethasone (QVAR) 80 MCG/ACT inhaler Inhale 2 puffs into the lungs daily. 1 Inhaler 12  . etonogestrel (NEXPLANON) 68 MG IMPL implant Inject into the skin.    . fluticasone (FLONASE) 50 MCG/ACT nasal spray Place 2 sprays into both nostrils daily. 16 g 6  . azithromycin (ZITHROMAX) 250 MG tablet 2 tabs poqday1, 1 tab poqday 2-5 6 tablet 0  . benzonatate (TESSALON PERLES) 100 MG capsule Take 2 capsules (200 mg total) by mouth 3 (three) times daily as needed for cough. 30 capsule 0   No facility-administered medications prior to visit.  Allergies: No Known Allergies  Social History   Socioeconomic History  . Marital status: Single    Spouse name: Not on file  . Number of children: Not on file  . Years of education: Not on file  . Highest education level: Not on file  Occupational History  . Not on file  Social Needs  . Financial resource strain: Not on file  . Food insecurity:    Worry: Not on file    Inability: Not on file  . Transportation needs:    Medical: Not on file    Non-medical: Not on file  Tobacco Use  . Smoking status: Never Smoker  . Smokeless tobacco: Never Used  Substance and Sexual Activity  . Alcohol use: Yes    Comment: occasional  . Drug use: No  . Sexual activity: Not on file  Lifestyle  . Physical activity:    Days per  week: Not on file    Minutes per session: Not on file  . Stress: Not on file  Relationships  . Social connections:    Talks on phone: Not on file    Gets together: Not on file    Attends religious service: Not on file    Active member of club or organization: Not on file    Attends meetings of clubs or organizations: Not on file    Relationship status: Not on file  . Intimate partner violence:    Fear of current or ex partner: Not on file    Emotionally abused: Not on file    Physically abused: Not on file    Forced sexual activity: Not on file  Other Topics Concern  . Not on file  Social History Narrative  . Not on file    No family history on file.   Review of Systems:  See HPI for pertinent ROS. All other ROS negative.    Physical Exam: Blood pressure 124/84, pulse (!) 110, temperature 98.3 F (36.8 C), temperature source Oral, resp. rate 16, height 5\' 2"  (1.575 m), weight 63.9 kg (140 lb 12.8 oz), last menstrual period 03/20/2018, SpO2 98 %., Body mass index is 25.75 kg/m. General:  WNWD WF. Appears in no acute distress. Neck: Supple. No thyromegaly. No lymphadenopathy. Lungs: Clear bilaterally to auscultation without wheezes, rales, or rhonchi. Breathing is unlabored. Heart: RRR with S1 S2. No murmurs, rubs, or gallops. Abdomen: Soft, non-tender, non-distended with normoactive bowel sounds. No hepatomegaly. No rebound/guarding. No obvious abdominal masses.  No area of tenderness with palpation. Musculoskeletal:  Strength and tone normal for age. Extremities/Skin: Warm and dry.  Neuro: Alert and oriented X 3. Moves all extremities spontaneously. Gait is normal. CNII-XII grossly in tact. Psych:  Responds to questions appropriately with a normal affect.     ASSESSMENT AND PLAN:  27 y.o. year old female with  1. Cyclical vomiting with nausea, intractability of vomiting not specified Will check labs.  If this gives any answers then will manage accordingly.  Otherwise if  these come back normal with no diagnoses then will refer to GI for further evaluation.  I have discussed to stay with a clear liquid diet in the interim.  Also will prescribe Phenergan to help with nausea vomiting symptoms and she can use over-the-counter Imodium if needed for diarrhea -- in the interim until she follows up with GI.  She voices understanding and agrees. - CBC with Differential/Platelet - COMPLETE METABOLIC PANEL WITH GFR - TSH - H. pylori breath test - Ambulatory referral to  Gastroenterology - promethazine (PHENERGAN) 25 MG tablet; Take 1 tablet (25 mg total) by mouth every 6 (six) hours as needed for nausea or vomiting.  Dispense: 30 tablet; Refill: 0  2. Diarrhea, unspecified type Will check labs.  If this gives any answers then will manage accordingly.  Otherwise if these come back normal with no diagnoses then will refer to GI for further evaluation.  I have discussed to stay with a clear liquid diet in the interim.  Also will prescribe Phenergan to help with nausea vomiting symptoms and she can use over-the-counter Imodium if needed for diarrhea -- in the interim until she follows up with GI.  She voices understanding and agrees. - CBC with Differential/Platelet - COMPLETE METABOLIC PANEL WITH GFR - TSH - H. pylori breath test - Celiac Pnl 2 rflx Endomysial Ab Ttr - Ambulatory referral to Gastroenterology   Signed, Frazier RichardsMary Beth Dixon, [place], [facility] 03/28/2018 4:26 PM

## 2018-03-29 LAB — H. PYLORI BREATH TEST: H. PYLORI BREATH TEST: NOT DETECTED

## 2018-04-03 ENCOUNTER — Telehealth: Payer: Self-pay

## 2018-04-03 NOTE — Telephone Encounter (Signed)
Patient called and states since her last office visit she has had panic attacks while on the job and she is not sure what is causing it. Patient is not willing to come back in for this problem but would like to know what she can take to help with the panic attacks. Pls advise

## 2018-04-04 NOTE — Telephone Encounter (Signed)
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  Patient also states her diarrhea has gotten better and she will hold off on going to GI specialist at this time because she doesn't feel the need to pay money if her symptoms have improved but would still like to keep referral . Pls advise on panic attacks

## 2018-04-04 NOTE — Telephone Encounter (Signed)
What type of work/ job does she do? What have been the circumstances when she has been having this panic?  How many times has this happened? Also --- find out---when was her last menses--- LMP ?

## 2018-04-04 NOTE — Telephone Encounter (Signed)
Call placed to patient lvmtrc 

## 2018-04-04 NOTE — Telephone Encounter (Signed)
It would really be best if she could come in for another OV for us to further discuss her symptoms and also for me to explain medication options and expectations of medications.  I could see her tomorrow afternoon after school hours.

## 2018-04-04 NOTE — Telephone Encounter (Signed)
Call placed to patient lvm to call office and schedule an appointtment/  Pt returned call appointment has been scheduled for 4/11 patient states she was told she can not return to work until she is stable

## 2018-04-05 ENCOUNTER — Encounter: Payer: Self-pay | Admitting: Physician Assistant

## 2018-04-05 ENCOUNTER — Ambulatory Visit: Payer: BC Managed Care – PPO | Admitting: Physician Assistant

## 2018-04-05 DIAGNOSIS — F411 Generalized anxiety disorder: Secondary | ICD-10-CM | POA: Diagnosis not present

## 2018-04-05 LAB — CBC WITH DIFFERENTIAL/PLATELET
BASOS PCT: 0.7 %
Basophils Absolute: 53 cells/uL (ref 0–200)
EOS PCT: 1.3 %
Eosinophils Absolute: 98 cells/uL (ref 15–500)
HEMATOCRIT: 40.5 % (ref 35.0–45.0)
Hemoglobin: 13.8 g/dL (ref 11.7–15.5)
LYMPHS ABS: 2423 {cells}/uL (ref 850–3900)
MCH: 28.6 pg (ref 27.0–33.0)
MCHC: 34.1 g/dL (ref 32.0–36.0)
MCV: 83.9 fL (ref 80.0–100.0)
MPV: 10.7 fL (ref 7.5–12.5)
Monocytes Relative: 7 %
Neutro Abs: 4403 cells/uL (ref 1500–7800)
Neutrophils Relative %: 58.7 %
PLATELETS: 262 10*3/uL (ref 140–400)
RBC: 4.83 10*6/uL (ref 3.80–5.10)
RDW: 13.1 % (ref 11.0–15.0)
Total Lymphocyte: 32.3 %
WBC: 7.5 10*3/uL (ref 3.8–10.8)
WBCMIX: 525 {cells}/uL (ref 200–950)

## 2018-04-05 LAB — COMPLETE METABOLIC PANEL WITH GFR
AG RATIO: 2.1 (calc) (ref 1.0–2.5)
ALT: 14 U/L (ref 6–29)
AST: 15 U/L (ref 10–30)
Albumin: 4.7 g/dL (ref 3.6–5.1)
Alkaline phosphatase (APISO): 48 U/L (ref 33–115)
BUN: 10 mg/dL (ref 7–25)
CO2: 22 mmol/L (ref 20–32)
Calcium: 10 mg/dL (ref 8.6–10.2)
Chloride: 106 mmol/L (ref 98–110)
Creat: 0.74 mg/dL (ref 0.50–1.10)
GFR, EST AFRICAN AMERICAN: 130 mL/min/{1.73_m2} (ref >=60)
GFR, EST NON AFRICAN AMERICAN: 112 mL/min/{1.73_m2} (ref >=60)
GLOBULIN: 2.2 g/dL (ref 1.9–3.7)
Glucose, Bld: 85 mg/dL (ref 65–99)
POTASSIUM: 4 mmol/L (ref 3.5–5.3)
Sodium: 141 mmol/L (ref 135–146)
TOTAL PROTEIN: 6.9 g/dL (ref 6.1–8.1)
Total Bilirubin: 0.4 mg/dL (ref 0.2–1.2)

## 2018-04-05 LAB — CELIAC PNL 2 RFLX ENDOMYSIAL AB TTR
(TTG) AB, IGG: 5 U/mL
(tTG) Ab, IgA: <1 U/mL
Endomysial Ab IgA: NEGATIVE
GLIADIN(DEAM) AB,IGA: 2 U (ref <20)
Gliadin(Deam) Ab,IgG: 5 U (ref <20)
IMMUNOGLOBULIN A: 70 mg/dL — AB (ref 81–463)

## 2018-04-05 LAB — TSH: TSH: 1.78 mIU/L

## 2018-04-05 MED ORDER — CLONAZEPAM 0.5 MG PO TABS: 0.5000 mg | ORAL_TABLET | Freq: Two times a day (BID) | ORAL | 0 refills | Status: DC

## 2018-04-05 MED ORDER — SERTRALINE HCL 50 MG PO TABS: 50.0000 mg | ORAL_TABLET | Freq: Every day | ORAL | 2 refills | Status: DC

## 2018-04-05 NOTE — Progress Notes (Signed)
Patient ID: Angela KnucklesCaitlyn Bryan MRN: [id number], DOB: 1991/05/31, 27 y.o. Date of Encounter: @DATE @  Chief Complaint:  Chief Complaint  Patient presents with  . discuss anxiety    HPI: 27 y.o. year old female  presents with above.   She reports that just over the past week she has been having new symptoms of anxiety and other symptoms.  States that she first noticed it on Saturday.  That evening she was going out to eat with her boyfriend and she started feeling anxious.  At that time thought that she was feeling anxious because she was worried about possibly developing GI symptoms.  She just recently had visit with me regarding episodes of vomiting and episodes of diarrhea.  Thought that her anxiety was related to those recent GI symptoms/ worried that she would develop vomiting or diarrhea after eating.  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  Says that one day this week they were going on a field trip.  Says that morning she felt very anxious and panicky.  Then, once they were getting ready to leave for the field trip, she started to feel panicked and had to get off the bus.  She states that "yesterday was a normal day and nothing was out of the routine" but she cried all day.  Says that she stayed at work and got through the work day but felt like she was about to cry at any moment.  I asked if she can think of any underlying stress or underlying issue that may be causing these symptoms.  Repeatedly she says that there is nothing that she can think of at all.  Asked if she usually feels anxious or overwhelmed at work as far as her job and she says no.  I asked repeatedly about issues regarding her boyfriend.  She says that everything is stable with him and their relationship and that there is no stress going on there.  Asked about with her family and parents.  Again, she says that there is no stress going on with her relationship  to her family/parents. She says that she has not recently moved and there has been no death, no other stressors that she can think of.   Says that her boyfriend lives with her.  Says there is no change with her living situation or her relationship with him.  Reviewed her recent visit with me regarding episodes of vomiting and diarrhea. Asked if now looking back in retrospect if she thinks that may be she was having some panic or anxiety that was causing those episodes.  Again she says that she does not think so.  Specifically asked about when they went to [place].  Asked if there was any kind of argument or underlying tension that may have caused her to have the vomiting that day.  She says that there was not and that [place] is one of her very favorite places.   Past Medical History:  Diagnosis Date  . Allergy   . Asthma      Home Meds: Outpatient Medications Prior to Visit  Medication Sig Dispense Refill  . acetaminophen (TYLENOL) 500 MG tablet Take 500 mg by mouth every 6 (six) hours as needed.    Marland Kitchen. albuterol (VENTOLIN HFA) 108 (90 Base) MCG/ACT inhaler Inhale 2 puffs into the lungs every 6 (six) hours as needed for wheezing or shortness of breath. 1 Inhaler 3  .  beclomethasone (QVAR) 80 MCG/ACT inhaler Inhale 2 puffs into the lungs daily. 1 Inhaler 12  . etonogestrel (NEXPLANON) 68 MG IMPL implant Inject into the skin.    . fluticasone (FLONASE) 50 MCG/ACT nasal spray Place 2 sprays into both nostrils daily. 16 g 6  . promethazine (PHENERGAN) 25 MG tablet Take 1 tablet (25 mg total) by mouth every 6 (six) hours as needed for nausea or vomiting. 30 tablet 0   No facility-administered medications prior to visit.     Allergies: No Known Allergies  Social History   Socioeconomic History  . Marital status: Single    Spouse name: Not on file  . Number of children: Not on file  . Years of education: Not on file  . Highest education level: Not on file  Occupational History  .  Not on file  Social Needs  . Financial resource strain: Not on file  . Food insecurity:    Worry: Not on file    Inability: Not on file  . Transportation needs:    Medical: Not on file    Non-medical: Not on file  Tobacco Use  . Smoking status: Never Smoker  . Smokeless tobacco: Never Used  Substance and Sexual Activity  . Alcohol use: Yes    Comment: occasional  . Drug use: No  . Sexual activity: Not on file  Lifestyle  . Physical activity:    Days per week: Not on file    Minutes per session: Not on file  . Stress: Not on file  Relationships  . Social connections:    Talks on phone: Not on file    Gets together: Not on file    Attends religious service: Not on file    Active member of club or organization: Not on file    Attends meetings of clubs or organizations: Not on file    Relationship status: Not on file  . Intimate partner violence:    Fear of current or ex partner: Not on file    Emotionally abused: Not on file    Physically abused: Not on file    Forced sexual activity: Not on file  Other Topics Concern  . Not on file  Social History Narrative  . Not on file    History reviewed. No pertinent family history.   Review of Systems:  See HPI for pertinent ROS. All other ROS negative.    Physical Exam: Blood pressure 108/80, pulse 87, temperature 98.1 F (36.7 C), temperature source Oral, resp. rate 16, height 5\' 2"  (1.575 m), weight 63 kg (139 lb), last menstrual period 03/20/2018, SpO2 99 %., Body mass index is 25.42 kg/m. General: WNWD WF.  Appears in no acute distress. Neck: Supple. No thyromegaly. No lymphadenopathy. Lungs: Clear bilaterally to auscultation without wheezes, rales, or rhonchi. Breathing is unlabored. Heart: RRR with S1 S2. No murmurs, rubs, or gallops. Abdomen: Soft, non-tender, non-distended with normoactive bowel sounds. No hepatomegaly. No rebound/guarding. No obvious abdominal masses. Musculoskeletal:  Strength and tone normal for  age. Extremities/Skin: Warm and dry.  Neuro: Alert and oriented X 3. Moves all extremities spontaneously. Gait is normal. CNII-XII grossly in tact. Psych: At one point, she started to get teary-eyed and her lip started to tremble but then she was able to control this, stop this.  Otherwise for the remainder of the visit mood and affect were appropriate.  Responds to questions appropriately with a normal affect.     ASSESSMENT AND PLAN:  27 y.o. year old female  with  1. Generalized anxiety disorder Discussed proper expectations and use of each medication.  Discussed that the Zoloft will take time to take effect.  If she feels that this is causing adverse effects or making her symptoms worse then she needs to call us immediately.  Otherwise even if she does not feel that her symptoms are improving she needs to be patient and simply continue to take this daily until follow-up visit in 6 weeks. Discussed that she can use the Klonopin as needed to decrease panic and anxiety in the interim.  However discussed that this medicine does cause some people to feel loopy or sleepy and not to use prior to driving or working.  They are scheduled to go on another field trip out of town this upcoming Monday.  Today is a Thursday.  Today is her first day out of work.  Writing for her to be out of work today through Monday with plans to return to work Tuesday.  Follow-up if needed.  Otherwise we will plan for follow-up in 6 weeks or sooner if needed.  Discussed all of the above regarding medications and she voices understanding and agrees. - sertraline (ZOLOFT) 50 MG tablet; Take 1 tablet (50 mg total) by mouth daily.  Dispense: 30 tablet; Refill: 2 - clonazePAM (KLONOPIN) 0.5 MG tablet; Take 1 tablet (0.5 mg total) by mouth 2 (two) times daily.  Dispense: 30 tablet; Refill: 0   Signed, [address] [place], [place], [place] 04/05/2018 11:34 AM

## 2018-05-14 ENCOUNTER — Ambulatory Visit: Payer: BC Managed Care – PPO | Admitting: Family Medicine

## 2018-05-14 ENCOUNTER — Ambulatory Visit: Payer: BC Managed Care – PPO | Admitting: Physician Assistant

## 2018-05-14 ENCOUNTER — Other Ambulatory Visit: Payer: Self-pay

## 2018-05-14 ENCOUNTER — Encounter: Payer: Self-pay | Admitting: Physician Assistant

## 2018-05-14 VITALS — BP 110/74 | HR 87 | Temp 98.7°F | Wt 140.2 lb

## 2018-05-14 DIAGNOSIS — F411 Generalized anxiety disorder: Secondary | ICD-10-CM

## 2018-05-14 DIAGNOSIS — K219 Gastro-esophageal reflux disease without esophagitis: Secondary | ICD-10-CM | POA: Insufficient documentation

## 2018-05-14 MED ORDER — SERTRALINE HCL 100 MG PO TABS: 100.0000 mg | ORAL_TABLET | Freq: Every day | ORAL | 2 refills | Status: DC

## 2018-05-14 MED ORDER — OMEPRAZOLE 20 MG PO CPDR: 20.0000 mg | DELAYED_RELEASE_CAPSULE | Freq: Every day | ORAL | 3 refills | Status: DC

## 2018-05-14 NOTE — Progress Notes (Signed)
Patient ID: Angela Bryan MRN: [id number], DOB: 25-Jul-1991, 27 y.o. Date of Encounter: @  Chief Complaint:  Chief Complaint  Patient presents with  . Anxiety    Patient in today for a 6 week follow up for anxiety. Also has c/o heartburn since starting zoloft    HPI: 27 y.o. year old female  presents with above.    04/05/2018: She reports that just over the past week she has been having new symptoms of anxiety and other symptoms.  States that she first noticed it on Saturday.  That evening she was going out to eat with her boyfriend and she started feeling anxious.  At that time thought that she was feeling anxious because she was worried about possibly developing GI symptoms.  She just recently had visit with me regarding episodes of vomiting and episodes of diarrhea.  Thought that her anxiety was related to those recent GI symptoms/ worried that she would develop vomiting or diarrhea after eating.  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  Says that one day this week they were going on a field trip.  Says that morning she felt very anxious and panicky.  Then, once they were getting ready to leave for the field trip, she started to feel panicked and had to get off the bus.  She states that "yesterday was a normal day and nothing was out of the routine" but she cried all day.  Says that she stayed at work and got through the work day but felt like she was about to cry at any moment.  I asked if she can think of any underlying stress or underlying issue that may be causing these symptoms.  Repeatedly she says that there is nothing that she can think of at all.  Asked if she usually feels anxious or overwhelmed at work as far as her job and she says no.  I asked repeatedly about issues regarding her boyfriend.  She says that everything is stable with him and their relationship and that there is no stress going on there.  Asked  about with her family and parents.  Again, she says that there is no stress going on with her relationship to her family/parents. She says that she has not recently moved and there has been no death, no other stressors that she can think of.   Says that her boyfriend lives with her.  Says there is no change with her living situation or her relationship with him.  Reviewed her recent visit with me regarding episodes of vomiting and diarrhea. Asked if now looking back in retrospect if she thinks that may be she was having some panic or anxiety that was causing those episodes.  Again she says that she does not think so.  Specifically asked about when they went to [place].  Asked if there was any kind of argument or underlying tension that may have caused her to have the vomiting that day.  She says that there was not and that [place] is one of her very favorite places.  AT THAT OV: Prescribed Zoloft  QD and Klonopin 0.5mg  PRN    05/14/2018: Today she reports that the Zoloft definitely has been helping.  Definitely can tell an improvement in her symptoms.  However, does not quite feel like she is "where she needs to be ".  States that when she has needed to use the Klonopin, she has  been breaking it in half "just enough to take the edge off ".  Says that she has used that occasionally if she is starting to feel anxious.  Has used that -- on average, probably about 1-2 times per week.  Today she also reports that she has been noticing some heartburn symptoms.  Will feel burning sensation in her upper chest and will sometimes cause her to go cough and sometimes even gag. She has no other concerns to address today.   Past Medical History:  Diagnosis Date  . Allergy   . Asthma      Home Meds: Outpatient Medications Prior to Visit  Medication Sig Dispense Refill  . acetaminophen (TYLENOL) 500 MG tablet Take 500 mg by mouth every 6 (six) hours as needed.    Marland Kitchen albuterol (VENTOLIN HFA) 108 (90  Base) MCG/ACT inhaler Inhale 2 puffs into the lungs every 6 (six) hours as needed for wheezing or shortness of breath. 1 Inhaler 3  . beclomethasone (QVAR) 80 MCG/ACT inhaler Inhale 2 puffs into the lungs daily. 1 Inhaler 12  . clonazePAM (KLONOPIN) 0.5 MG tablet Take 1 tablet (0.5 mg total) by mouth 2 (two) times daily. 30 tablet 0  . etonogestrel (NEXPLANON) 68 MG IMPL implant Inject into the skin.    . fluticasone (FLONASE) 50 MCG/ACT nasal spray Place 2 sprays into both nostrils daily. 16 g 6  . promethazine (PHENERGAN) 25 MG tablet Take 1 tablet (25 mg total) by mouth every 6 (six) hours as needed for nausea or vomiting. 30 tablet 0  . sertraline (ZOLOFT) 50 MG tablet Take 1 tablet (50 mg total) by mouth daily. 30 tablet 2   No facility-administered medications prior to visit.     Allergies: No Known Allergies  Social History   Socioeconomic History  . Marital status: Single    Spouse name: Not on file  . Number of children: Not on file  . Years of education: Not on file  . Highest education level: Not on file  Occupational History  . Not on file  Social Needs  . Financial resource strain: Not on file  . Food insecurity:    Worry: Not on file    Inability: Not on file  . Transportation needs:    Medical: Not on file    Non-medical: Not on file  Tobacco Use  . Smoking status: Never Smoker  . Smokeless tobacco: Never Used  Substance and Sexual Activity  . Alcohol use: Yes    Comment: occasional  . Drug use: No  . Sexual activity: Not on file  Lifestyle  . Physical activity:    Days per week: Not on file    Minutes per session: Not on file  . Stress: Not on file  Relationships  . Social connections:    Talks on phone: Not on file    Gets together: Not on file    Attends religious service: Not on file    Active member of club or organization: Not on file    Attends meetings of clubs or organizations: Not on file    Relationship status: Not on file  . Intimate  partner violence:    Fear of current or ex partner: Not on file    Emotionally abused: Not on file    Physically abused: Not on file    Forced sexual activity: Not on file  Other Topics Concern  . Not on file  Social History Narrative  . Not on file  History reviewed. No pertinent family history.   Review of Systems:  See HPI for pertinent ROS. All other ROS negative.    Physical Exam: Blood pressure 110/74, pulse 87, temperature 98.7 F (37.1 C), temperature source Oral, weight 63.6 kg (140 lb 4 oz), SpO2 98 %., Body mass index is 25.65 kg/m. General: WNWD WF.  Appears in no acute distress. Neck: Supple. No thyromegaly. No lymphadenopathy. Lungs: Clear bilaterally to auscultation without wheezes, rales, or rhonchi. Breathing is unlabored. Heart: RRR with S1 S2. No murmurs, rubs, or gallops. Abdomen: Soft, non-tender, non-distended with normoactive bowel sounds. No hepatomegaly. No rebound/guarding. No obvious abdominal masses. Musculoskeletal:  Strength and tone normal for age. Extremities/Skin: Warm and dry.  Neuro: Alert and oriented X 3. Moves all extremities spontaneously. Gait is normal. CNII-XII grossly in tact. Psych: Responds to questions appropriately with a normal affect.Mood, affect appropriate throughout visit today.     ASSESSMENT AND PLAN:  27 y.o. year old female with   1. Generalized anxiety disorder 05/14/2018: At this time will increase Zoloft to 100 mg daily.  She can continue to use a half of a Klonopin as needed.  Will have her return for follow-up visit in 2 months.  Follow-up sooner if needed. - sertraline (ZOLOFT) 100 MG tablet; Take 1 tablet (100 mg total) by mouth daily.  Dispense: 30 tablet; Refill: 2  2. Gastroesophageal reflux disease, esophagitis presence not specified 05/14/2018: Will add omeprazole 20 mg daily.  Will follow up symptom relief with this at her follow-up visit in 2 months. - omeprazole (PRILOSEC) 20 MG capsule; Take 1 capsule (20  mg total) by mouth daily.  Dispense: 30 capsule; Refill: 3  Follow up visit in 2 months.  Follow-up sooner if needed.  [address] [place], [place], [facility] 05/14/2018 4:53 PM

## 2018-06-21 ENCOUNTER — Other Ambulatory Visit: Payer: Self-pay

## 2018-06-21 DIAGNOSIS — G43A0 Cyclical vomiting, not intractable: Secondary | ICD-10-CM

## 2018-06-21 MED ORDER — PROMETHAZINE HCL 25 MG PO TABS: 25.0000 mg | ORAL_TABLET | Freq: Four times a day (QID) | ORAL | 0 refills | Status: DC | PRN

## 2018-06-21 NOTE — Telephone Encounter (Signed)
Patient called and left a VM requesting a refill on her medication also requested to switch  her clonazepam to another medication, however I called patient back and left her a message that she was suppose to follow up in 2 months and that she need to call and make an appointment before we can make any changes to her medication.

## 2018-06-25 ENCOUNTER — Encounter: Payer: Self-pay | Admitting: Physician Assistant

## 2018-06-25 ENCOUNTER — Other Ambulatory Visit: Payer: Self-pay

## 2018-06-25 ENCOUNTER — Ambulatory Visit: Payer: BC Managed Care – PPO | Admitting: Physician Assistant

## 2018-06-25 VITALS — BP 118/76 | HR 93 | Temp 98.2°F | Resp 18 | Ht 62.0 in | Wt 143.4 lb

## 2018-06-25 DIAGNOSIS — F411 Generalized anxiety disorder: Secondary | ICD-10-CM | POA: Diagnosis not present

## 2018-06-25 DIAGNOSIS — K219 Gastro-esophageal reflux disease without esophagitis: Secondary | ICD-10-CM

## 2018-06-25 MED ORDER — SERTRALINE HCL 50 MG PO TABS: 50.0000 mg | ORAL_TABLET | Freq: Every day | ORAL | 0 refills | Status: DC

## 2018-06-25 MED ORDER — ALPRAZOLAM 0.25 MG PO TABS: 0.2500 mg | ORAL_TABLET | Freq: Three times a day (TID) | ORAL | 0 refills | Status: AC | PRN

## 2018-06-25 MED ORDER — ESCITALOPRAM OXALATE 10 MG PO TABS
10.0000 mg | ORAL_TABLET | Freq: Every day | ORAL | 2 refills | Status: DC
Start: 2018-06-25 — End: 2020-09-17

## 2018-06-25 NOTE — Progress Notes (Signed)
Patient ID: Angela Bryan MRN: [id number], DOB: July 29, 1991, 27 y.o. Date of Encounter: @DATE @  Chief Complaint:  Chief Complaint  Patient presents with  . Anxiety    HPI: 27 y.o. year old female  presents with above.    04/05/2018: She reports that just over the past week she has been having new symptoms of anxiety and other symptoms.  States that she first noticed it on Saturday.  That evening she was going out to eat with her boyfriend and she started feeling anxious.  At that time thought that she was feeling anxious because she was worried about possibly developing GI symptoms.  She just recently had visit with me regarding episodes of vomiting and episodes of diarrhea.  Thought that her anxiety was related to those recent GI symptoms/ worried that she would develop vomiting or diarrhea after eating.  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  Says that one day this week they were going on a field trip.  Says that morning she felt very anxious and panicky.  Then, once they were getting ready to leave for the field trip, she started to feel panicked and had to get off the bus.  She states that "yesterday was a normal day and nothing was out of the routine" but she cried all day.  Says that she stayed at work and got through the work day but felt like she was about to cry at any moment.  I asked if she can think of any underlying stress or underlying issue that may be causing these symptoms.  Repeatedly she says that there is nothing that she can think of at all.  Asked if she usually feels anxious or overwhelmed at work as far as her job and she says no.  I asked repeatedly about issues regarding her boyfriend.  She says that everything is stable with him and their relationship and that there is no stress going on there.  Asked about with her family and parents.  Again, she says that there is no stress going on with her  relationship to her family/parents. She says that she has not recently moved and there has been no death, no other stressors that she can think of.   Says that her boyfriend lives with her.  Says there is no change with her living situation or her relationship with him.  Reviewed her recent visit with me regarding episodes of vomiting and diarrhea. Asked if now looking back in retrospect if she thinks that may be she was having some panic or anxiety that was causing those episodes.  Again she says that she does not think so.  Specifically asked about when they went to [place].  Asked if there was any kind of argument or underlying tension that may have caused her to have the vomiting that day.  She says that there was not and that [employer] is one of her very favorite places.  AT THAT OV: Prescribed Zoloft 50mg  QD and Klonopin 0.5mg  PRN    05/14/2018: Today she reports that the Zoloft definitely has been helping.  Definitely can tell an improvement in her symptoms.  However, does not quite feel like she is "where she needs to be ".  States that when she has needed to use the Klonopin, she has been breaking it in half "just enough to take the edge off ".  Says that she has used that occasionally  if she is starting to feel anxious.  Has used that -- on average, probably about 1-2 times per week.  Today she also reports that she has been noticing some heartburn symptoms.  Will feel burning sensation in her upper chest and will sometimes cause her to go cough and sometimes even gag. She has no other concerns to address today.   06/25/2018:  Today she reports that she has quit using the Klonopin.  Seem to make her symptoms worse and definitely did not seem to help. Also she is thinking that we need to change the Zoloft to something different.  Still feels like she cannot go out and do things because afraid she will feel panicky and anxious. At last visit I did prescribe the omeprazole to help with her  reflux symptoms.  She states that she is taking this and this is helping.  Heartburn symptoms are controlled/resolved since using this.  No other concerns to address today.    Past Medical History:  Diagnosis Date  . Allergy   . Asthma      Home Meds: Outpatient Medications Prior to Visit  Medication Sig Dispense Refill  . acetaminophen (TYLENOL) 500 MG tablet Take 500 mg by mouth every 6 (six) hours as needed.    Marland Kitchen. albuterol (VENTOLIN HFA) 108 (90 Base) MCG/ACT inhaler Inhale 2 puffs into the lungs every 6 (six) hours as needed for wheezing or shortness of breath. 1 Inhaler 3  . beclomethasone (QVAR) 80 MCG/ACT inhaler Inhale 2 puffs into the lungs daily. 1 Inhaler 12  . etonogestrel (NEXPLANON) 68 MG IMPL implant Inject into the skin.    . fluticasone (FLONASE) 50 MCG/ACT nasal spray Place 2 sprays into both nostrils daily. 16 g 6  . omeprazole (PRILOSEC) 20 MG capsule Take 1 capsule (20 mg total) by mouth daily. 30 capsule 3  . promethazine (PHENERGAN) 25 MG tablet Take 1 tablet (25 mg total) by mouth every 6 (six) hours as needed for nausea or vomiting. 30 tablet 0  . clonazePAM (KLONOPIN) 0.5 MG tablet Take 1 tablet (0.5 mg total) by mouth 2 (two) times daily. 30 tablet 0  . sertraline (ZOLOFT) 100 MG tablet Take 1 tablet (100 mg total) by mouth daily. 30 tablet 2   No facility-administered medications prior to visit.     Allergies: No Known Allergies  Social History   Socioeconomic History  . Marital status: Single    Spouse name: Not on file  . Number of children: Not on file  . Years of education: Not on file  . Highest education level: Not on file  Occupational History  . Not on file  Social Needs  . Financial resource strain: Not on file  . Food insecurity:    Worry: Not on file    Inability: Not on file  . Transportation needs:    Medical: Not on file    Non-medical: Not on file  Tobacco Use  . Smoking status: Never Smoker  . Smokeless tobacco: Never Used    Substance and Sexual Activity  . Alcohol use: Yes    Comment: occasional  . Drug use: No  . Sexual activity: Not on file  Lifestyle  . Physical activity:    Days per week: Not on file    Minutes per session: Not on file  . Stress: Not on file  Relationships  . Social connections:    Talks on phone: Not on file    Gets together: Not on file  Attends religious service: Not on file    Active member of club or organization: Not on file    Attends meetings of clubs or organizations: Not on file    Relationship status: Not on file  . Intimate partner violence:    Fear of current or ex partner: Not on file    Emotionally abused: Not on file    Physically abused: Not on file    Forced sexual activity: Not on file  Other Topics Concern  . Not on file  Social History Narrative  . Not on file    History reviewed. No pertinent family history.   Review of Systems:  See HPI for pertinent ROS. All other ROS negative.    Physical Exam: Blood pressure 118/76, pulse 93, temperature 98.2 F (36.8 C), temperature source Oral, resp. rate 18, height 5\' 2"  (1.575 m), weight 65 kg (143 lb 6.4 oz), last menstrual period 06/18/2018, SpO2 98 %., Body mass index is 26.23 kg/m. General: WNWD WF. Appears in no acute distress. Neck: Supple. No thyromegaly. No lymphadenopathy. Lungs: Clear bilaterally to auscultation without wheezes, rales, or rhonchi. Breathing is unlabored. Heart: RRR with S1 S2. No murmurs, rubs, or gallops. Musculoskeletal:  Strength and tone normal for age. Extremities/Skin: Warm and dry.  Neuro: Alert and oriented X 3. Moves all extremities spontaneously. Gait is normal. CNII-XII grossly in tact. Psych:  Responds to questions appropriately with a normal affect.     ASSESSMENT AND PLAN:  27 y.o. year old female with   1. Generalized anxiety disorder 05/14/2018: At this time will increase Zoloft to 100 mg daily.  She can continue to use a half of a Klonopin as needed.   Will have her return for follow-up visit in 2 months.  Follow-up sooner if needed. - sertraline (ZOLOFT) 100 MG tablet; Take 1 tablet (100 mg total) by mouth daily.  Dispense: 30 tablet; Refill: 2 06/25/2018: She will wean off of the Zoloft.  She will take 50 mg daily for 1 week then 50 mg every other day for 1 week. Will start Lexapro 10 mg daily.  Will start this in 1 week.  Will wait to start this after she has done the Zoloft 50 mg for a week. She also states that the Klonopin seemed to make symptoms worse.  Has not been using that anymore.  However is wanting a medicine to use in place of this.  Therefore I am adding alprazolam 0.25 mg to use as needed. - sertraline (ZOLOFT) 50 MG tablet; Take 1 tablet (50 mg total) by mouth daily.  Dispense: 30 tablet; Refill: 0 - escitalopram (LEXAPRO) 10 MG tablet; Take 1 tablet (10 mg total) by mouth daily.  Dispense: 30 tablet; Refill: 2 - ALPRAZolam (XANAX) 0.25 MG tablet; Take 1 tablet (0.25 mg total) by mouth 3 (three) times daily as needed for anxiety.  Dispense: 30 tablet; Refill: 0    2. Gastroesophageal reflux disease, esophagitis presence not specified 05/14/2018: Will add omeprazole 20 mg daily.  Will follow up symptom relief with this at her follow-up visit in 2 months. - omeprazole (PRILOSEC) 20 MG capsule; Take 1 capsule (20 mg total) by mouth daily.  Dispense: 30 capsule; Refill: 3 06/25/2018: GERD is controlled with using omeprazole 20 mg daily.  Have her use this daily for a couple more weeks then go to as needed.  Follow up visit in 6 weeks.  Follow-up sooner if needed.  Signed, [address] [place], [place], [facility] 06/25/2018 11:48 AM

## 2018-07-23 ENCOUNTER — Other Ambulatory Visit: Payer: Self-pay | Admitting: Physician Assistant

## 2018-07-23 DIAGNOSIS — F411 Generalized anxiety disorder: Secondary | ICD-10-CM

## 2018-09-10 ENCOUNTER — Telehealth: Payer: Self-pay

## 2018-09-10 DIAGNOSIS — F411 Generalized anxiety disorder: Secondary | ICD-10-CM

## 2018-09-10 NOTE — Telephone Encounter (Signed)
Recommend that she follow-up with psychiatry and get medications prescribed by them. (Refill denied)

## 2018-09-10 NOTE — Telephone Encounter (Signed)
Patient called requesting a refill on her alprazolam. Patient states she has scheduled to see a psychiatrist which is now scheduled for 11/5. However they are going to try and work her in this week. Patient states she does not want to pay a co pay for both our office and for the psychiatrist and is asking if she can get a refill? Please advise

## 2018-09-11 NOTE — Telephone Encounter (Signed)
Call placed to patient she had her appointment today to see the psychiatry

## 2019-02-27 ENCOUNTER — Encounter: Payer: Self-pay | Admitting: Family Medicine

## 2019-02-27 ENCOUNTER — Ambulatory Visit: Payer: BC Managed Care – PPO | Admitting: Family Medicine

## 2019-02-27 VITALS — BP 130/76 | HR 108 | Temp 98.3°F | Resp 18 | Ht 62.0 in | Wt 155.0 lb

## 2019-02-27 DIAGNOSIS — H6501 Acute serous otitis media, right ear: Secondary | ICD-10-CM | POA: Diagnosis not present

## 2019-02-27 DIAGNOSIS — J069 Acute upper respiratory infection, unspecified: Secondary | ICD-10-CM | POA: Diagnosis not present

## 2019-02-27 DIAGNOSIS — J4531 Mild persistent asthma with (acute) exacerbation: Secondary | ICD-10-CM | POA: Diagnosis not present

## 2019-02-27 MED ORDER — AZITHROMYCIN 250 MG PO TABS: ORAL_TABLET | ORAL | 0 refills | Status: DC

## 2019-02-27 MED ORDER — PREDNISONE 20 MG PO TABS: 40.0000 mg | ORAL_TABLET | Freq: Every day | ORAL | 0 refills | Status: AC

## 2019-02-27 MED ORDER — IPRATROPIUM-ALBUTEROL 0.5-2.5 (3) MG/3ML IN SOLN
3.0000 mL | Freq: Once | RESPIRATORY_TRACT | Status: AC
  Administered 2019-02-27: 3 mL via RESPIRATORY_TRACT

## 2019-02-27 MED ORDER — HYDROCODONE-HOMATROPINE 5-1.5 MG/5ML PO SYRP: 5.0000 mL | ORAL_SOLUTION | Freq: Every day | ORAL | 0 refills | Status: DC

## 2019-02-27 NOTE — Progress Notes (Signed)
Patient ID: Angela Bryan, female    DOB: [birth year], 27 y.o.   MRN: [id number]  PCP: Donita Brooks, MD  Chief Complaint  Patient presents with  . Runny nose, ST, chest congestion, cough    Subjective:   Angela Bryan is a 28 y.o. female, presents to clinic with CC of uri, cough, SOB/asthma exacerbation URI   This is a new problem. The current episode started in the past 7 days. The problem has been gradually worsening. There has been no fever. Associated symptoms include congestion, coughing, ear pain, headaches, a plugged ear sensation, a rash, rhinorrhea, sinus pain, sneezing, a sore throat and wheezing. Pertinent negatives include no abdominal pain, chest pain, diarrhea, dysuria, joint pain, joint swelling, nausea, neck pain, swollen glands or vomiting. She has tried inhaler use for the symptoms. The treatment provided no relief.  Asthma  She complains of chest tightness, cough, difficulty breathing, frequent throat clearing, hoarse voice, shortness of breath and wheezing. There is no hemoptysis or sputum production. This is a recurrent problem. The current episode started in the past 7 days. The problem occurs constantly. The problem has been gradually worsening. The cough is non-productive, paroxysmal and hoarse. Associated symptoms include dyspnea on exertion, ear congestion, ear pain, headaches, nasal congestion, postnasal drip, rhinorrhea, sneezing and a sore throat. Pertinent negatives include no appetite change, chest pain, fever, orthopnea, sweats, trouble swallowing or weight loss. Her symptoms are aggravated by minimal activity, URI and lying down. Her past medical history is significant for asthma. There is no history of pneumonia.      Patient Active Problem List   Diagnosis Date Noted  . GERD (gastroesophageal reflux disease) 05/14/2018  . Generalized anxiety disorder 04/05/2018  . Pelvic inflammatory disease 02/06/2017     Prior to Admission medications     Medication Sig Start Date End Date Taking? Authorizing Provider  acetaminophen (TYLENOL) 500 MG tablet Take 500 mg by mouth every 6 (six) hours as needed.   Yes [provider]  albuterol (VENTOLIN HFA) 108 (90 Base) MCG/ACT inhaler Inhale 2 puffs into the lungs every 6 (six) hours as needed for wheezing or shortness of breath. 01/23/18  Yes Pickard, Priscille Heidelberg, MD  ALPRAZolam Prudy Feeler) 0.25 MG tablet Take 1 tablet (0.25 mg total) by mouth 3 (three) times daily as needed for anxiety. 06/25/18 06/25/19 Yes Dorena Bodo, PA-C  beclomethasone (QVAR) 80 MCG/ACT inhaler Inhale 2 puffs into the lungs daily. 04/27/15  Yes Donita Brooks, MD  escitalopram (LEXAPRO) 10 MG tablet Take 1 tablet (10 mg total) by mouth daily. 06/25/18 06/25/19 Yes Dixon, Mary B, PA-C  fluticasone (FLONASE) 50 MCG/ACT nasal spray Place 2 sprays into both nostrils daily. 01/23/18  Yes Donita Brooks, MD  sertraline (ZOLOFT) 50 MG tablet TAKE 1 TABLET BY MOUTH EVERY DAY 07/23/18  Yes Allayne Butcher B, PA-C  azithromycin (ZITHROMAX) 250 MG tablet Take 2 tabs (500 mg) PO q d for 1d, then take 1 tab (250 mg) PO q d for day 2-5 02/27/19   Danelle Berry, PA-C  HYDROcodone-homatropine (HYCODAN) 5-1.5 MG/5ML syrup Take 5 mLs by mouth at bedtime. 02/27/19   Danelle Berry, PA-C  predniSONE (DELTASONE) 20 MG tablet Take 2 tablets (40 mg total) by mouth daily with breakfast for 5 days. 02/27/19 03/04/19  Danelle Berry, PA-C     No Known Allergies   History reviewed. No pertinent family history.   Social History   Socioeconomic History  . Marital status: Single  Spouse name: Not on file  . Number of children: Not on file  . Years of education: Not on file  . Highest education level: Not on file  Occupational History  . Not on file  Social Needs  . Financial resource strain: Not on file  . Food insecurity:    Worry: Not on file    Inability: Not on file  . Transportation needs:    Medical: Not on file    Non-medical: Not on file   Tobacco Use  . Smoking status: Never Smoker  . Smokeless tobacco: Never Used  Substance and Sexual Activity  . Alcohol use: Yes    Comment: occasional  . Drug use: No  . Sexual activity: Not on file  Lifestyle  . Physical activity:    Days per week: Not on file    Minutes per session: Not on file  . Stress: Not on file  Relationships  . Social connections:    Talks on phone: Not on file    Gets together: Not on file    Attends religious service: Not on file    Active member of club or organization: Not on file    Attends meetings of clubs or organizations: Not on file    Relationship status: Not on file  . Intimate partner violence:    Fear of current or ex partner: Not on file    Emotionally abused: Not on file    Physically abused: Not on file    Forced sexual activity: Not on file  Other Topics Concern  . Not on file  Social History Narrative  . Not on file     Review of Systems  Constitutional: Negative.  Negative for appetite change, fever and weight loss.  HENT: Positive for congestion, ear pain, hoarse voice, postnasal drip, rhinorrhea, sinus pain, sneezing and sore throat. Negative for trouble swallowing.   Eyes: Negative.   Respiratory: Positive for cough, shortness of breath and wheezing. Negative for hemoptysis and sputum production.   Cardiovascular: Positive for dyspnea on exertion. Negative for chest pain.  Gastrointestinal: Negative.  Negative for abdominal pain, diarrhea, nausea and vomiting.  Endocrine: Negative.   Genitourinary: Negative.  Negative for dysuria.  Musculoskeletal: Negative.  Negative for joint pain and neck pain.  Skin: Positive for rash.  Allergic/Immunologic: Negative.   Neurological: Positive for headaches.  Hematological: Negative.   Psychiatric/Behavioral: Negative.   All other systems reviewed and are negative.      Objective:    Vitals:   02/27/19 1605  BP: 130/76  Pulse: (!) 108  Resp: 18  Temp: 98.3 F (36.8 C)   TempSrc: Oral  SpO2: 98%  Weight: 155 lb (70.3 kg)  Height: 5\' 2"  (1.575 m)      Physical Exam Vitals signs and nursing note reviewed.  Constitutional:      General: She is not in acute distress.    Appearance: She is well-developed. She is not ill-appearing, toxic-appearing or diaphoretic.  HENT:     Head: Normocephalic and atraumatic.     Right Ear: Hearing, ear canal and external ear normal. No drainage, swelling or tenderness. A middle ear effusion is present. There is no impacted cerumen. No mastoid tenderness. Tympanic membrane is not bulging.     Left Ear: Hearing, tympanic membrane, ear canal and external ear normal. No decreased hearing noted. No drainage, swelling or tenderness. There is no impacted cerumen. No mastoid tenderness.     Nose: Mucosal edema, congestion and rhinorrhea present.  Right Sinus: No maxillary sinus tenderness or frontal sinus tenderness.     Left Sinus: No maxillary sinus tenderness or frontal sinus tenderness.     Mouth/Throat:     Mouth: Mucous membranes are moist. Mucous membranes are not pale.     Pharynx: Uvula midline. Posterior oropharyngeal erythema present. No oropharyngeal exudate or uvula swelling.     Tonsils: No tonsillar abscesses.  Eyes:     General: No scleral icterus.       Right eye: No discharge.        Left eye: No discharge.     Conjunctiva/sclera: Conjunctivae normal.     Pupils: Pupils are equal, round, and reactive to light.  Neck:     Musculoskeletal: Normal range of motion and neck supple.     Trachea: No tracheal deviation.  Cardiovascular:     Rate and Rhythm: Normal rate and regular rhythm.     Pulses: Normal pulses.     Heart sounds: Normal heart sounds. No murmur. No friction rub. No gallop.   Pulmonary:     Effort: Pulmonary effort is normal. No respiratory distress.     Breath sounds: No stridor. Wheezing and rhonchi present. No rales.  Abdominal:     General: Bowel sounds are normal. There is no  distension.     Palpations: Abdomen is soft.     Tenderness: There is no abdominal tenderness. There is no rebound.  Musculoskeletal: Normal range of motion.  Skin:    General: Skin is warm and dry.     Coloration: Skin is not pale.     Findings: No rash.  Neurological:     Mental Status: She is alert.     Motor: No abnormal muscle tone.     Coordination: Coordination normal.  Psychiatric:        Behavior: Behavior normal.           Assessment & Plan:      ICD-10-CM   1. 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 Upper respiratory tract infection, unspecified type J06.9 azithromycin (ZITHROMAX) 250 MG tablet    ipratropium-albuterol (DUONEB) 0.5-2.5 (3) MG/3ML nebulizer solution 3 mL  3. Non-recurrent acute serous otitis media of right ear H65.01     Patient is a 28 year old female presents with URI symptoms and asthma exacerbation, her coughing is severe very frequent coughing fits, did improve with a breathing treatment in clinic and she was reexamined she continued to have some diminished and coarse breath sounds bilaterally at the bases, will treat for asthma exacerbation and cover for any early pneumonia with Z-Pak, steroids, use rescue inhaler very frequently, cough syrup given so patient can get some rest.  She does have some fluid in her right ear does not appear consistent with acute otitis media but she was instructed to call us if there is any worsening of right ear pain or sinus pain or pressure she would need a different antibiotic and dosing to treat this.  She verbalized understanding.  Also encouraged to follow-up if not having much improved cough wheeze and shortness of breath and 3 to 4 days.   Danelle Berry, PA-C 02/27/19 4:32  PM

## 2019-02-27 NOTE — Patient Instructions (Signed)
Take the steroids and Zpak and use rescue inhaler more often  Also use daily allergy meds like claritin/zyrtec with flonase - this will help with the fluid in your ear and with you nasal symptoms.  For cough you can use the prescription provided you can also use Robitussin or Delsym, I highly recommend using Mucinex or Mucinex DM.  Please contact us or follow-up if you have any worsening any other location we may need to reevaluate you or get you a different antibiotic.

## 2019-03-05 ENCOUNTER — Ambulatory Visit: Payer: BC Managed Care – PPO | Admitting: Family Medicine

## 2019-03-05 ENCOUNTER — Encounter: Payer: Self-pay | Admitting: Family Medicine

## 2019-03-05 VITALS — BP 108/66 | HR 97 | Temp 98.2°F | Resp 16 | Wt 154.1 lb

## 2019-03-05 DIAGNOSIS — J4541 Moderate persistent asthma with (acute) exacerbation: Secondary | ICD-10-CM | POA: Diagnosis not present

## 2019-03-05 DIAGNOSIS — R059 Cough, unspecified: Secondary | ICD-10-CM

## 2019-03-05 DIAGNOSIS — J180 Bronchopneumonia, unspecified organism: Secondary | ICD-10-CM

## 2019-03-05 DIAGNOSIS — R079 Chest pain, unspecified: Secondary | ICD-10-CM

## 2019-03-05 DIAGNOSIS — R05 Cough: Secondary | ICD-10-CM

## 2019-03-05 MED ORDER — IPRATROPIUM-ALBUTEROL 0.5-2.5 (3) MG/3ML IN SOLN
3.0000 mL | Freq: Once | RESPIRATORY_TRACT | Status: AC
  Administered 2019-03-05: 3 mL via RESPIRATORY_TRACT

## 2019-03-05 MED ORDER — BUDESONIDE-FORMOTEROL FUMARATE 160-4.5 MCG/ACT IN AERO: 2.0000 | INHALATION_SPRAY | Freq: Two times a day (BID) | RESPIRATORY_TRACT | 0 refills | Status: DC

## 2019-03-05 MED ORDER — ALBUTEROL SULFATE HFA 108 (90 BASE) MCG/ACT IN AERS: 2.0000 | INHALATION_SPRAY | Freq: Four times a day (QID) | RESPIRATORY_TRACT | 3 refills | Status: DC | PRN

## 2019-03-05 MED ORDER — PREDNISONE 20 MG PO TABS: ORAL_TABLET | ORAL | 0 refills | Status: DC

## 2019-03-05 MED ORDER — LEVOFLOXACIN 500 MG PO TABS: 500.0000 mg | ORAL_TABLET | Freq: Every day | ORAL | 0 refills | Status: DC

## 2019-03-05 MED ORDER — BENZONATATE 100 MG PO CAPS: 100.0000 mg | ORAL_CAPSULE | Freq: Three times a day (TID) | ORAL | 1 refills | Status: DC | PRN

## 2019-03-05 NOTE — Patient Instructions (Addendum)
Start the stronger antibiotic with steroid and stop using Qvar and start using symbicort 2 puffs 2 times a day for the next two weeks.  You can adjust the steroid taper if you would like to modify it or shorten it.    If you are not feeling improvement in your pain, sweats and coughing in the next 3-5 days go get the chest x-ray done to further evaluate.  With all the steroids and with cough, I also suggest getting back on a acid blocking medicine for the next 2-4 weeks, something like prilosec 20- 40 mg daily early in the morning on an empty stomach.  Take the steroids later in the morning after eating.

## 2019-03-05 NOTE — Progress Notes (Signed)
Patient ID: Angela Bryan, female    DOB: May 24, 1991, 28 y.o.   MRN: [id number]  PCP: Donita Brooks, MD  Chief Complaint  Patient presents with  . Cough    Has c/o chest congestion, and cough.     Subjective:   Angela Bryan is a 28 y.o. female, presents to clinic with CC of follow up for continued illness.  She continues to have a cough that sounds more severe and she has new chest pain across her low ribs posteriorly.  Pain is constant, moderate, worse with coughing or deep inspiration.  She has had frequent sweats intermittently all day and also at night, which is very uncommon for her.  She denies any fever.  Her postnasal drip nasal congestion and sinus pain and pressure has improved, but her breathing and cough have seemed to worsen with the new chest pain.  She continues to use her Qvar and rescue inhaler.  She has taken multiple medicines over-the-counter as suggested last week, including Mucinex, decongestants, Flonase and she did take the Zpak.      Patient Active Problem List   Diagnosis Date Noted  . GERD (gastroesophageal reflux disease) 05/14/2018  . Generalized anxiety disorder 04/05/2018  . Pelvic inflammatory disease 02/06/2017     Prior to Admission medications   Medication Sig Start Date End Date Taking? Authorizing Provider  acetaminophen (TYLENOL) 500 MG tablet Take 500 mg by mouth every 6 (six) hours as needed.   Yes [provider]  albuterol (VENTOLIN HFA) 108 (90 Base) MCG/ACT inhaler Inhale 2 puffs into the lungs every 6 (six) hours as needed for wheezing or shortness of breath. 01/23/18  Yes Pickard, Priscille Heidelberg, MD  ALPRAZolam Prudy Feeler) 0.25 MG tablet Take 1 tablet (0.25 mg total) by mouth 3 (three) times daily as needed for anxiety. 06/25/18 06/25/19 Yes Dorena Bodo, PA-C  beclomethasone (QVAR) 80 MCG/ACT inhaler Inhale 2 puffs into the lungs daily. 04/27/15  Yes Donita Brooks, MD  escitalopram (LEXAPRO) 10 MG tablet Take 1 tablet (10 mg total)  by mouth daily. 06/25/18 06/25/19 Yes Dixon, Mary B, PA-C  fluticasone (FLONASE) 50 MCG/ACT nasal spray Place 2 sprays into both nostrils daily. 01/23/18  Yes Donita Brooks, MD  HYDROcodone-homatropine [facility]) 5-1.5 MG/5ML syrup Take 5 mLs by mouth at bedtime. 02/27/19  Yes Danelle Berry, PA-C  sertraline (ZOLOFT) 50 MG tablet TAKE 1 TABLET BY MOUTH EVERY DAY 07/23/18  Yes Allayne Butcher B, PA-C     No Known Allergies   No family history on file.   Social History   Socioeconomic History  . Marital status: Single    Spouse name: Not on file  . Number of children: Not on file  . Years of education: Not on file  . Highest education level: Not on file  Occupational History  . Not on file  Social Needs  . Financial resource strain: Not on file  . Food insecurity:    Worry: Not on file    Inability: Not on file  . Transportation needs:    Medical: Not on file    Non-medical: Not on file  Tobacco Use  . Smoking status: Never Smoker  . Smokeless tobacco: Never Used  Substance and Sexual Activity  . Alcohol use: Yes    Comment: occasional  . Drug use: No  . Sexual activity: Not on file  Lifestyle  . Physical activity:    Days per week: Not on file    Minutes per  session: Not on file  . Stress: Not on file  Relationships  . Social connections:    Talks on phone: Not on file    Gets together: Not on file    Attends religious service: Not on file    Active member of club or organization: Not on file    Attends meetings of clubs or organizations: Not on file    Relationship status: Not on file  . Intimate partner violence:    Fear of current or ex partner: Not on file    Emotionally abused: Not on file    Physically abused: Not on file    Forced sexual activity: Not on file  Other Topics Concern  . Not on file  Social History Narrative  . Not on file     Review of Systems  Constitutional: Positive for diaphoresis.  Respiratory: Positive for cough, chest tightness,  shortness of breath and wheezing.   Cardiovascular: Positive for chest pain. Negative for palpitations and leg swelling.  Gastrointestinal: Negative.  Negative for abdominal pain.  Musculoskeletal: Negative.   Skin: Negative.   Neurological: Negative.   Hematological: Negative.   Psychiatric/Behavioral: Negative.        Objective:    Vitals:   03/05/19 1603  BP: 108/66  Pulse: 98  Resp: 16  Temp: 98.2 F (36.8 C)  TempSrc: Oral  SpO2: 98%  Weight: 154 lb 2 oz (69.9 kg)      Physical Exam Vitals signs and nursing note reviewed.  Constitutional:      General: She is not in acute distress.    Appearance: She is well-developed. She is not ill-appearing, toxic-appearing or diaphoretic.  HENT:     Head: Normocephalic and atraumatic.     Right Ear: Hearing, tympanic membrane, ear canal and external ear normal.     Left Ear: Hearing, tympanic membrane, ear canal and external ear normal.     Nose: Mucosal edema and rhinorrhea present.     Right Sinus: No maxillary sinus tenderness or frontal sinus tenderness.     Left Sinus: No maxillary sinus tenderness or frontal sinus tenderness.     Mouth/Throat:     Mouth: Mucous membranes are not pale.     Pharynx: Uvula midline. No oropharyngeal exudate or uvula swelling.     Tonsils: No tonsillar abscesses.  Eyes:     General:        Right eye: No discharge.        Left eye: No discharge.     Conjunctiva/sclera: Conjunctivae normal.     Pupils: Pupils are equal, round, and reactive to light.  Neck:     Musculoskeletal: Normal range of motion and neck supple.     Trachea: No tracheal deviation.  Cardiovascular:     Rate and Rhythm: Normal rate and regular rhythm.     Heart sounds: Normal heart sounds.  Pulmonary:     Effort: No respiratory distress.     Breath sounds: No stridor. Decreased breath sounds, wheezing and rhonchi present. No rales.     Comments: Frequent severe coughing fits, poor inspiratory effort due to  coughing Abdominal:     General: Bowel sounds are normal. There is no distension.     Palpations: Abdomen is soft.  Musculoskeletal: Normal range of motion.  Skin:    General: Skin is warm and dry.     Coloration: Skin is not pale.     Findings: No rash.  Neurological:     Mental Status: She  is alert.     Motor: No abnormal muscle tone.     Coordination: Coordination normal.  Psychiatric:        Behavior: Behavior normal.           Assessment & Plan:      ICD-10-CM   1. Bronchopneumonia, unspecified organism J18.0 levofloxacin (LEVAQUIN) 500 MG tablet  2. Moderate persistent asthmatic bronchitis with acute exacerbation J45.41 ipratropium-albuterol (DUONEB) 0.5-2.5 (3) MG/3ML nebulizer solution 3 mL    budesonide-formoterol (SYMBICORT) 160-4.5 MCG/ACT inhaler    predniSONE (DELTASONE) 20 MG tablet    albuterol (VENTOLIN HFA) 108 (90 Base) MCG/ACT inhaler  3. Cough R05 ipratropium-albuterol (DUONEB) 0.5-2.5 (3) MG/3ML nebulizer solution 3 mL    budesonide-formoterol (SYMBICORT) 160-4.5 MCG/ACT inhaler    predniSONE (DELTASONE) 20 MG tablet    DG Chest 2 View    benzonatate (TESSALON) 100 MG capsule  4. Chest pain, unspecified type R07.9 DG Chest 2 View     Neb tx given for increased WOB and frequent coughing, her BS did improve and coughing frequency decrease, wheeze decreased, still come coarse BS b/l at the bases - with pain follow up CXR.   Feel that she need abx coverage for bacterial lower respiratory infection at this point with sweats, worsening cough, worsening SOB and no improvement for more than a week.  Danelle Berry, PA-C 03/05/19 4:20 PM

## 2019-03-11 ENCOUNTER — Encounter: Payer: Self-pay | Admitting: Family Medicine

## 2019-03-27 ENCOUNTER — Other Ambulatory Visit: Payer: Self-pay | Admitting: Family Medicine

## 2019-03-27 DIAGNOSIS — R059 Cough, unspecified: Secondary | ICD-10-CM

## 2019-03-27 DIAGNOSIS — J4541 Moderate persistent asthma with (acute) exacerbation: Secondary | ICD-10-CM

## 2019-03-27 DIAGNOSIS — R05 Cough: Secondary | ICD-10-CM

## 2019-04-11 ENCOUNTER — Other Ambulatory Visit: Payer: Self-pay | Admitting: Family Medicine

## 2019-04-11 DIAGNOSIS — J4541 Moderate persistent asthma with (acute) exacerbation: Secondary | ICD-10-CM

## 2019-04-11 DIAGNOSIS — R059 Cough, unspecified: Secondary | ICD-10-CM

## 2019-04-11 DIAGNOSIS — R05 Cough: Secondary | ICD-10-CM

## 2019-04-11 MED ORDER — BUDESONIDE-FORMOTEROL FUMARATE 160-4.5 MCG/ACT IN AERO: INHALATION_SPRAY | RESPIRATORY_TRACT | 1 refills | Status: DC

## 2019-04-11 NOTE — Addendum Note (Signed)
Addended by: Phillips Odor on: 04/11/2019 01:31 PM   Modules accepted: Orders

## 2019-12-26 ENCOUNTER — Other Ambulatory Visit: Payer: Self-pay

## 2019-12-26 DIAGNOSIS — J208 Acute bronchitis due to other specified organisms: Secondary | ICD-10-CM

## 2019-12-26 MED ORDER — FLUTICASONE PROPIONATE 50 MCG/ACT NA SUSP: 2.0000 | Freq: Every day | NASAL | 2 refills | Status: DC

## 2020-01-22 ENCOUNTER — Ambulatory Visit: Payer: BC Managed Care – PPO | Attending: Internal Medicine

## 2020-01-22 ENCOUNTER — Other Ambulatory Visit: Payer: Self-pay

## 2020-01-22 DIAGNOSIS — Z20822 Contact with and (suspected) exposure to covid-19: Secondary | ICD-10-CM

## 2020-01-23 ENCOUNTER — Ambulatory Visit (INDEPENDENT_AMBULATORY_CARE_PROVIDER_SITE_OTHER): Payer: BC Managed Care – PPO | Admitting: Family Medicine

## 2020-01-23 DIAGNOSIS — J069 Acute upper respiratory infection, unspecified: Secondary | ICD-10-CM

## 2020-01-23 DIAGNOSIS — J4531 Mild persistent asthma with (acute) exacerbation: Secondary | ICD-10-CM

## 2020-01-23 LAB — NOVEL CORONAVIRUS, NAA: SARS-CoV-2, NAA: NOT DETECTED

## 2020-01-23 MED ORDER — PREDNISONE 20 MG PO TABS: ORAL_TABLET | ORAL | 0 refills | Status: DC

## 2020-01-23 MED ORDER — HYDROCODONE-HOMATROPINE 5-1.5 MG/5ML PO SYRP: 5.0000 mL | ORAL_SOLUTION | Freq: Three times a day (TID) | ORAL | 0 refills | Status: DC | PRN

## 2020-01-23 NOTE — Progress Notes (Signed)
Subjective:    Patient ID: Angela Bryan, female    DOB: 1991-02-05, 29 y.o.   MRN: [id number]  HPI  Patient is a very pleasant 29 year old Caucasian female who is being seen today as a telephone visit.  She consents to be seen via telephone.  Phone call began at 155.  Phone call concluded at 205.  Has a history of mild persistent asthma currently on Qvar as a daily prophylactic medication.  Approximately twice a year she will have to take oral steroids for an asthma exacerbation.  Beginning Monday of this week she developed flulike symptoms including body aches, runny nose, headache, fever to 99.7, and a dry nonproductive cough.  She has had to use her albuterol more frequently to help manage the cough and the wheezing.  She denies any chest pain.  She denies any purulent sputum.  She denies any hemoptysis.  She denies any pleurisy.  However she is coughing frequently.  She was tested for Covid yesterday however the test has not returned.  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 Past Medical History:  Diagnosis Date  . Allergy   . Asthma    No past surgical history on file. Current Outpatient Medications on File Prior to Visit  Medication Sig Dispense Refill  . acetaminophen (TYLENOL) 500 MG tablet Take 500 mg by mouth every 6 (six) hours as needed.    Marland Kitchen albuterol (VENTOLIN HFA) 108 (90 Base) MCG/ACT inhaler Inhale 2 puffs into the lungs every 6 (six) hours as needed for wheezing or shortness of breath. 1 Inhaler 3  . beclomethasone (QVAR) 80 MCG/ACT inhaler Inhale 2 puffs into the lungs daily. 1 Inhaler 12  . benzonatate (TESSALON) 100 MG capsule Take 1-2 capsules (100-200 mg total) by mouth 3 (three) times daily as needed for cough. 30 capsule 1  . budesonide-formoterol (SYMBICORT) 160-4.5 MCG/ACT inhaler TAKE 2 PUFFS BY MOUTH TWICE A DAY 30.6 Inhaler 1  . escitalopram (LEXAPRO) 10 MG tablet Take 1 tablet (10 mg total) by mouth daily. 30 tablet 2  . fluticasone  (FLONASE) 50 MCG/ACT nasal spray Place 2 sprays into both nostrils daily. 16 g 2  . HYDROcodone-homatropine (HYCODAN) 5-1.5 MG/5ML syrup Take 5 mLs by mouth at bedtime. 120 mL 0  . levofloxacin (LEVAQUIN) 500 MG tablet Take 1 tablet (500 mg total) by mouth daily. 7 tablet 0  . predniSONE (DELTASONE) 20 MG tablet 3 tabs poqday 1-3, 2 tabs poqday 4-6, 1 tab poqday 7-9 18 tablet 0  . sertraline (ZOLOFT) 50 MG tablet TAKE 1 TABLET BY MOUTH EVERY DAY 30 tablet 0   No current facility-administered medications on file prior to visit.   No Known Allergies Social History   Socioeconomic History  . Marital status: Single    Spouse name: Not on file  . Number of children: Not on file  . Years of education: Not on file  . Highest education level: Not on file  Occupational History  . Not on file  Tobacco Use  . Smoking status: Never Smoker  . Smokeless tobacco: Never Used  Substance and Sexual Activity  . Alcohol use: Yes    Comment: occasional  . Drug use: No  . Sexual activity: Not on file  Other Topics Concern  . Not on file  Social History Narrative  . Not on file   Social Determinants of Health   Financial Resource Strain:   . Difficulty of Paying Living Expenses: Not on file  Food Insecurity:   .  Worried About Charity fundraiser in the Last Year: Not on file  . Ran Out of Food in the Last Year: Not on file  Transportation Needs:   . Lack of Transportation (Medical): Not on file  . Lack of Transportation (Non-Medical): Not on file  Physical Activity:   . Days of Exercise per Week: Not on file  . Minutes of Exercise per Session: Not on file  Stress:   . Feeling of Stress : Not on file  Social Connections:   . Frequency of Communication with Friends and Family: Not on file  . Frequency of Social Gatherings with Friends and Family: Not on file  . Attends Religious Services: Not on file  . Active Member of Clubs or Organizations: Not on file  . Attends Archivist  Meetings: Not on file  . Marital Status: Not on file  Intimate Partner Violence:   . Fear of Current or Ex-Partner: Not on file  . Emotionally Abused: Not on file  . Physically Abused: Not on file  . Sexually Abused: Not on file     Review of Systems  All other systems reviewed and are negative.      Objective:   Physical Exam        Assessment & Plan:  Acute URI  Mild persistent asthmatic bronchitis with acute exacerbation  Unable to perform a physical exam due to the fact the patient is being seen over the telephone however there is no evidence of respiratory distress.  She is speaking full and complete sentences with no increased work of breathing.  There is no audible wheezing.  She denies any chest pain or shortness of breath or signs of hypoxia.  She can use Hycodan 1 teaspoon every 6 hours as needed for cough.  She has good insight on when her asthma is worsening.  Therefore I did give her a prescription for prednisone with instructions when to fill.  At the present time I believe her cough is due to the virus which is likely Covid however if she begins to wheeze more frequently, I want her to start the prednisone to avoid having to go to the emergency room for an asthma exacerbation.  At the present time there is no evidence of a bacterial infection or pneumonia.  Patient knows when to seek medical attention if symptoms worsen.

## 2020-02-03 DIAGNOSIS — O0993 Supervision of high risk pregnancy, unspecified, third trimester: Secondary | ICD-10-CM | POA: Insufficient documentation

## 2020-03-23 LAB — OB RESULTS CONSOLE GC/CHLAMYDIA: Gonorrhea: NEGATIVE

## 2020-03-24 LAB — OB RESULTS CONSOLE RUBELLA ANTIBODY, IGM: Rubella: IMMUNE

## 2020-03-24 LAB — OB RESULTS CONSOLE VARICELLA ZOSTER ANTIBODY, IGG: Varicella: IMMUNE

## 2020-03-24 LAB — OB RESULTS CONSOLE HIV ANTIBODY (ROUTINE TESTING): HIV: NONREACTIVE

## 2020-03-24 LAB — OB RESULTS CONSOLE ABO/RH: RH Type: POSITIVE

## 2020-03-24 LAB — OB RESULTS CONSOLE HEPATITIS B SURFACE ANTIGEN: Hepatitis B Surface Ag: NEGATIVE

## 2020-03-24 LAB — OB RESULTS CONSOLE RPR: RPR: NONREACTIVE

## 2020-07-27 ENCOUNTER — Ambulatory Visit: Payer: BC Managed Care – PPO | Admitting: *Deleted

## 2020-08-05 ENCOUNTER — Ambulatory Visit: Payer: BC Managed Care – PPO | Admitting: *Deleted

## 2020-09-17 ENCOUNTER — Other Ambulatory Visit: Payer: Self-pay

## 2020-09-17 ENCOUNTER — Observation Stay: Payer: BC Managed Care – PPO

## 2020-09-17 ENCOUNTER — Observation Stay
Admission: RE | Admit: 2020-09-17 | Discharge: 2020-09-17 | Disposition: A | Discharge status: Home / Self Care | Payer: BC Managed Care – PPO | Source: Home / Self Care | Attending: Obstetrics and Gynecology | Admitting: Obstetrics and Gynecology

## 2020-09-17 ENCOUNTER — Other Ambulatory Visit: Payer: Self-pay | Admitting: Obstetrics and Gynecology

## 2020-09-17 DIAGNOSIS — O1403 Mild to moderate pre-eclampsia, third trimester: Secondary | ICD-10-CM | POA: Diagnosis not present

## 2020-09-17 DIAGNOSIS — O1493 Unspecified pre-eclampsia, third trimester: Secondary | ICD-10-CM | POA: Insufficient documentation

## 2020-09-17 DIAGNOSIS — Z79899 Other long term (current) drug therapy: Secondary | ICD-10-CM | POA: Insufficient documentation

## 2020-09-17 DIAGNOSIS — O133 Gestational [pregnancy-induced] hypertension without significant proteinuria, third trimester: Secondary | ICD-10-CM | POA: Insufficient documentation

## 2020-09-17 DIAGNOSIS — O139 Gestational [pregnancy-induced] hypertension without significant proteinuria, unspecified trimester: Secondary | ICD-10-CM

## 2020-09-17 DIAGNOSIS — Z91040 Latex allergy status: Secondary | ICD-10-CM | POA: Insufficient documentation

## 2020-09-17 DIAGNOSIS — J45909 Unspecified asthma, uncomplicated: Secondary | ICD-10-CM | POA: Insufficient documentation

## 2020-09-17 DIAGNOSIS — Z3A36 36 weeks gestation of pregnancy: Secondary | ICD-10-CM | POA: Insufficient documentation

## 2020-09-17 DIAGNOSIS — R030 Elevated blood-pressure reading, without diagnosis of hypertension: Secondary | ICD-10-CM | POA: Diagnosis present

## 2020-09-17 DIAGNOSIS — E119 Type 2 diabetes mellitus without complications: Secondary | ICD-10-CM | POA: Insufficient documentation

## 2020-09-17 HISTORY — DX: Depression, unspecified: F32.A

## 2020-09-17 HISTORY — DX: Anxiety disorder, unspecified: F41.9

## 2020-09-17 HISTORY — DX: Other specified postprocedural states: Z98.890

## 2020-09-17 HISTORY — DX: Mental disorder, not otherwise specified: F99

## 2020-09-17 HISTORY — DX: Type 2 diabetes mellitus without complications: E11.9

## 2020-09-17 HISTORY — DX: Headache, unspecified: R51.9

## 2020-09-17 LAB — PROTEIN / CREATININE RATIO, URINE
Creatinine, Urine: 80 mg/dL
Protein Creatinine Ratio: 0.38 mg/mg{Cre} — ABNORMAL HIGH (ref 0.00–0.15)
Total Protein, Urine: 30 mg/dL

## 2020-09-17 LAB — CBC
HCT: 35 % — ABNORMAL LOW (ref 36.0–46.0)
Hemoglobin: 12 g/dL (ref 12.0–15.0)
MCH: 28.2 pg (ref 26.0–34.0)
MCHC: 34.3 g/dL (ref 30.0–36.0)
MCV: 82.4 fL (ref 80.0–100.0)
Platelets: 196 10*3/uL (ref 150–400)
RBC: 4.25 MIL/uL (ref 3.87–5.11)
RDW: 14.7 % (ref 11.5–15.5)
WBC: 9.5 10*3/uL (ref 4.0–10.5)
nRBC: 0 % (ref 0.0–0.2)

## 2020-09-17 LAB — COMPREHENSIVE METABOLIC PANEL
ALT: 12 U/L (ref 0–44)
AST: 17 U/L (ref 15–41)
Albumin: 3 g/dL — ABNORMAL LOW (ref 3.5–5.0)
Alkaline Phosphatase: 96 U/L (ref 38–126)
Anion gap: 10 (ref 5–15)
BUN: 7 mg/dL (ref 6–20)
CO2: 20 mmol/L — ABNORMAL LOW (ref 22–32)
Calcium: 9 mg/dL (ref 8.9–10.3)
Chloride: 105 mmol/L (ref 98–111)
Creatinine, Ser: 0.62 mg/dL (ref 0.44–1.00)
GFR calc Af Amer: >60 mL/min (ref >60)
GFR calc non Af Amer: >60 mL/min (ref >60)
Glucose, Bld: 97 mg/dL (ref 70–99)
Potassium: 3.8 mmol/L (ref 3.5–5.1)
Sodium: 135 mmol/L (ref 135–145)
Total Bilirubin: 0.4 mg/dL (ref 0.3–1.2)
Total Protein: 6.3 g/dL — ABNORMAL LOW (ref 6.5–8.1)

## 2020-09-17 LAB — OB RESULTS CONSOLE GBS: GBS: POSITIVE

## 2020-09-17 NOTE — Discharge Summary (Signed)
Angela Bryan is a 29 y.o. female. She is at [redacted]w[redacted]d gestation. Patient's last menstrual period was 01/01/2020 (approximate). Estimated Date of Delivery: 10/10/20  Prenatal care site: [facility]  Current pregnancy complicated by: 1. Pre-pregnancy BMI >30 2. GDMA1- insulin ordered but never started.  3. Agoraphobia and anxiety; on Pristiq; has not been able to come to clinic for visits since 28wks 4. Placenta previa, posterior. Resolved at 28wks 5. Hx Asthma  Chief complaint: sent from office with elevated BP and intermittent headaches. No headache currently.    S: Resting comfortably. no CTX, no VB.no LOF,  Active fetal movement. Denies: HA, visual changes, SOB, or RUQ/epigastric pain  Maternal Medical History:   Past Medical History:  Diagnosis Date   Allergy    Anxiety    Asthma    Depression    Diabetes mellitus without complication (HCC)    Headache    Mental disorder    PONV (postoperative nausea and vomiting)     History reviewed. No pertinent surgical history.  Allergies  Allergen Reactions   Green Coffee Bean-Yerba Mate Anaphylaxis    Has epi pen    Latex Rash    Prior to Admission medications   Medication Sig Start Date End Date Taking? Authorizing Provider  acetaminophen (TYLENOL) 500 MG tablet Take 500 mg by mouth every 6 (six) hours as needed.   Yes [provider]  desvenlafaxine (PRISTIQ) 100 MG 24 hr tablet Take 100 mg by mouth daily.   Yes [provider]  fluticasone (FLONASE) 50 MCG/ACT nasal spray Place 2 sprays into both nostrils daily. 12/26/19  Yes Donita Brooks, MD  albuterol (VENTOLIN HFA) 108 (90 Base) MCG/ACT inhaler Inhale 2 puffs into the lungs every 6 (six) hours as needed for wheezing or shortness of breath. Patient not taking: Reported on 09/17/2020 03/05/19   Danelle Berry, PA-C  beclomethasone (QVAR) 80 MCG/ACT inhaler Inhale 2 puffs into the lungs daily. Patient not taking: Reported on 09/17/2020  04/27/15   Donita Brooks, MD  benzonatate (TESSALON) 100 MG capsule Take 1-2 capsules (100-200 mg total) by mouth 3 (three) times daily as needed for cough. Patient not taking: Reported on 09/17/2020 03/05/19   Danelle Berry, PA-C  budesonide-formoterol (SYMBICORT) 160-4.5 MCG/ACT inhaler TAKE 2 PUFFS BY MOUTH TWICE A DAY Patient not taking: Reported on 09/17/2020 04/11/19   Donita Brooks, MD  escitalopram (LEXAPRO) 10 MG tablet Take 1 tablet (10 mg total) by mouth daily. 06/25/18 06/25/19  Dixon, Patriciaann Clan, PA-C  HYDROcodone-homatropine (HYCODAN) 5-1.5 MG/5ML syrup Take 5 mLs by mouth at bedtime. Patient not taking: Reported on 09/17/2020 02/27/19   Danelle Berry, PA-C  HYDROcodone-homatropine [facility]) 5-1.5 MG/5ML syrup Take 5 mLs by mouth every 8 (eight) hours as needed for cough. Patient not taking: Reported on 09/17/2020 01/23/20   Donita Brooks, MD  levofloxacin (LEVAQUIN) 500 MG tablet Take 1 tablet (500 mg total) by mouth daily. Patient not taking: Reported on 09/17/2020 03/05/19   Danelle Berry, PA-C  predniSONE (DELTASONE) 20 MG tablet 3 tabs poqday 1-3, 2 tabs poqday 4-6, 1 tab poqday 7-9 Patient not taking: Reported on 09/17/2020 03/05/19   Danelle Berry, PA-C  predniSONE (DELTASONE) 20 MG tablet 3 tabs poqday 1-2, 2 tabs poqday 3-4, 1 tab poqday 5-6 Patient not taking: Reported on 09/17/2020 01/23/20   Donita Brooks, MD  sertraline (ZOLOFT) 50 MG tablet TAKE 1 TABLET BY MOUTH EVERY DAY Patient not taking: Reported on 09/17/2020 07/23/18   Dorena Bodo,  PA-C      Social History: She  reports that she has never smoked. She has never used smokeless tobacco. She reports current alcohol use. She reports that she does not use drugs.  Family History: no history of gyn cancers  Review of Systems: A full review of systems was performed and negative except as noted in the HPI.     O:  BP 116/72    Pulse 100    Temp 98.5 F (36.9 C) (Oral)    Resp (!) 183    LMP 01/01/2020 (Approximate)   Results for orders placed or performed during the hospital encounter of 09/17/20 (from the past 48 hour(s))  Protein / creatinine ratio, urine   Collection Time: 09/17/20 11:26 AM  Result Value Ref Range   Creatinine, Urine 80 mg/dL   Total Protein, Urine 30 mg/dL   Protein Creatinine Ratio 0.38 (H) 0.00 - 0.15 mg/mg[Cre]  Comprehensive metabolic panel   Collection Time: 09/17/20 11:42 AM  Result Value Ref Range   Sodium 135 135 - 145 mmol/L   Potassium 3.8 3.5 - 5.1 mmol/L   Chloride 105 98 - 111 mmol/L   CO2 20 (L) 22 - 32 mmol/L   Glucose, Bld 97 70 - 99 mg/dL   BUN 7 6 - 20 mg/dL   Creatinine, Ser 4.40 0.44 - 1.00 mg/dL   Calcium 9.0 8.9 - 34.7 mg/dL   Total Protein 6.3 (L) 6.5 - 8.1 g/dL   Albumin 3.0 (L) 3.5 - 5.0 g/dL   AST 17 15 - 41 U/L   ALT 12 0 - 44 U/L   Alkaline Phosphatase 96 38 - 126 U/L   Total Bilirubin 0.4 0.3 - 1.2 mg/dL   GFR calc non Af Amer >60 >60 mL/min   GFR calc Af Amer >60 >60 mL/min   Anion gap 10 5 - 15  CBC on admission   Collection Time: 09/17/20 11:42 AM  Result Value Ref Range   WBC 9.5 4.0 - 10.5 K/uL   RBC 4.25 3.87 - 5.11 MIL/uL   Hemoglobin 12.0 12.0 - 15.0 g/dL   HCT 42.5 (L) 36 - 46 %   MCV 82.4 80.0 - 100.0 fL   MCH 28.2 26.0 - 34.0 pg   MCHC 34.3 30.0 - 36.0 g/dL   RDW 95.6 38.7 - 56.4 %   Platelets 196 150 - 400 K/uL   nRBC 0.0 0.0 - 0.2 %     Constitutional: NAD, AAOx3  HE/ENT: extraocular movements grossly intact, moist mucous membranes CV: RRR PULM: nl respiratory effort, CTABL     Abd: gravid, non-tender, non-distended, soft      Ext: Non-tender, Nonedematous   Psych: mood appropriate, speech normal Pelvic: SVE- 1/80/-2, soft/anterior.   Fetal  monitoring: Cat I Appropriate for GA Baseline: 150bpm Variability: moderate Accelerations:  present x >2 Decelerations absent Time  TOCO: irreg UCs, q2-74min, mild.     A/P: 29 y.o. [redacted]w[redacted]d here for antenatal surveillance for new onset HTN noted in office.    Principle Diagnosis:  Pre-eclampsia without severe features.    Preterm labor: not present.   Fetal Wellbeing: Reassuring Cat 1 tracing with Reactive NST   Reviewed proteinuria with mild range BPs, pre-eclampsia dx. Scheduled IOL at 37.1wks on 9/26 at 0500  Dc home with strict preeclampsia precautions.   D/c home stable, precautions reviewed, follow-up as scheduled.    Randa Ngo, CNM 09/17/2020  2:09 PM

## 2020-09-17 NOTE — Discharge Instructions (Signed)

## 2020-09-17 NOTE — OB Triage Note (Signed)
Pt presents to L&D from office for PIH eval. Pt BP up at office. Reports mild headache every other day, occasioanl spotty vision randomly, denies epigastric pain. Reports good fetal movement, denies bleeding, LOF, or contractions. EFM and toco applied and explained. Plan to monitor fetal and maternal well being and assess for hypertension.

## 2020-09-17 NOTE — Progress Notes (Signed)
Dating: EDD: 10/10/20  by LMP: 01/01/20 and c/w [place] at 7+2 wks.   Preg c/b: 1. Pre-eclampsia without severe features, dx at 36.5wks 2. Pre-pregnancy BMI >30 3. GDMA1- insulin ordered but never started.  4. Agoraphobia and anxiety; on Pristiq; has not been able to come to clinic for visits since 28wks 5. Placenta previa, posterior. Resolved at 28wks 6. Hx Asthma  Prenatal Labs: Blood type/Rh APos  Antibody screen neg  Rubella Immune  Varicella Immune  RPR NR  HBsAg Neg  HIV NR  GC neg  Chlamydia neg  Genetic screening negative  1 hour GTT 210  3 hour GTT   GBS Pos   Tdap: declined  Flu: declined Contraception: TBD Infant feeding: TBD

## 2020-09-17 NOTE — OB Triage Note (Signed)
Pt discharged home in stable condition, VSS. Pt. Verbalized understanding of plan. No questions or concerns at this time.

## 2020-09-19 ENCOUNTER — Inpatient Hospital Stay
Admission: EM | Admit: 2020-09-19 | Discharge: 2020-09-22 | DRG: 787 | Disposition: A | Discharge status: Home / Self Care | Payer: BC Managed Care – PPO

## 2020-09-19 ENCOUNTER — Inpatient Hospital Stay: Payer: BC Managed Care – PPO | Admitting: Anesthesiology

## 2020-09-19 ENCOUNTER — Encounter: Payer: Self-pay | Admitting: Obstetrics and Gynecology

## 2020-09-19 ENCOUNTER — Other Ambulatory Visit: Payer: Self-pay

## 2020-09-19 DIAGNOSIS — F411 Generalized anxiety disorder: Secondary | ICD-10-CM | POA: Diagnosis present

## 2020-09-19 DIAGNOSIS — O24410 Gestational diabetes mellitus in pregnancy, diet controlled: Secondary | ICD-10-CM

## 2020-09-19 DIAGNOSIS — O1493 Unspecified pre-eclampsia, third trimester: Secondary | ICD-10-CM | POA: Diagnosis present

## 2020-09-19 DIAGNOSIS — O9962 Diseases of the digestive system complicating childbirth: Secondary | ICD-10-CM | POA: Diagnosis present

## 2020-09-19 DIAGNOSIS — K219 Gastro-esophageal reflux disease without esophagitis: Secondary | ICD-10-CM | POA: Diagnosis present

## 2020-09-19 DIAGNOSIS — O324XX0 Maternal care for high head at term, not applicable or unspecified: Secondary | ICD-10-CM | POA: Diagnosis not present

## 2020-09-19 DIAGNOSIS — O24420 Gestational diabetes mellitus in childbirth, diet controlled: Secondary | ICD-10-CM | POA: Diagnosis present

## 2020-09-19 DIAGNOSIS — O9081 Anemia of the puerperium: Secondary | ICD-10-CM | POA: Diagnosis not present

## 2020-09-19 DIAGNOSIS — O99344 Other mental disorders complicating childbirth: Secondary | ICD-10-CM | POA: Diagnosis present

## 2020-09-19 DIAGNOSIS — Z3A37 37 weeks gestation of pregnancy: Secondary | ICD-10-CM | POA: Diagnosis not present

## 2020-09-19 DIAGNOSIS — Z20822 Contact with and (suspected) exposure to covid-19: Secondary | ICD-10-CM | POA: Diagnosis present

## 2020-09-19 DIAGNOSIS — F4000 Agoraphobia, unspecified: Secondary | ICD-10-CM | POA: Diagnosis present

## 2020-09-19 DIAGNOSIS — R030 Elevated blood-pressure reading, without diagnosis of hypertension: Secondary | ICD-10-CM | POA: Diagnosis present

## 2020-09-19 DIAGNOSIS — O1404 Mild to moderate pre-eclampsia, complicating childbirth: Secondary | ICD-10-CM | POA: Diagnosis not present

## 2020-09-19 DIAGNOSIS — D62 Acute posthemorrhagic anemia: Secondary | ICD-10-CM | POA: Diagnosis not present

## 2020-09-19 LAB — COMPREHENSIVE METABOLIC PANEL
ALT: 13 U/L (ref 0–44)
AST: 20 U/L (ref 15–41)
Albumin: 3.3 g/dL — ABNORMAL LOW (ref 3.5–5.0)
Alkaline Phosphatase: 116 U/L (ref 38–126)
Anion gap: 12 (ref 5–15)
BUN: 7 mg/dL (ref 6–20)
CO2: 19 mmol/L — ABNORMAL LOW (ref 22–32)
Calcium: 9.5 mg/dL (ref 8.9–10.3)
Chloride: 106 mmol/L (ref 98–111)
Creatinine, Ser: 0.79 mg/dL (ref 0.44–1.00)
GFR calc Af Amer: >60 mL/min (ref >60)
GFR calc non Af Amer: >60 mL/min (ref >60)
Glucose, Bld: 80 mg/dL (ref 70–99)
Potassium: 3.9 mmol/L (ref 3.5–5.1)
Sodium: 137 mmol/L (ref 135–145)
Total Bilirubin: 0.5 mg/dL (ref 0.3–1.2)
Total Protein: 7 g/dL (ref 6.5–8.1)

## 2020-09-19 LAB — ABO/RH: ABO/RH(D): A POS

## 2020-09-19 LAB — GLUCOSE, CAPILLARY
Glucose-Capillary: 97 mg/dL (ref 70–99)
Glucose-Capillary: 73 mg/dL (ref 70–99)

## 2020-09-19 LAB — TYPE AND SCREEN
ABO/RH(D): A POS
Antibody Screen: NEGATIVE

## 2020-09-19 LAB — CBC
HCT: 34.5 % — ABNORMAL LOW (ref 36.0–46.0)
Hemoglobin: 11.9 g/dL — ABNORMAL LOW (ref 12.0–15.0)
MCH: 28.1 pg (ref 26.0–34.0)
MCHC: 34.5 g/dL (ref 30.0–36.0)
MCV: 81.4 fL (ref 80.0–100.0)
Platelets: 221 10*3/uL (ref 150–400)
RBC: 4.24 MIL/uL (ref 3.87–5.11)
RDW: 14.7 % (ref 11.5–15.5)
WBC: 9.2 10*3/uL (ref 4.0–10.5)
nRBC: 0 % (ref 0.0–0.2)

## 2020-09-19 LAB — RESPIRATORY PANEL BY RT PCR (FLU A&B, COVID)
Influenza A by PCR: NEGATIVE
Influenza B by PCR: NEGATIVE
SARS Coronavirus 2 by RT PCR: NEGATIVE

## 2020-09-19 LAB — HEMOGLOBIN A1C
Hgb A1c MFr Bld: 6 % — ABNORMAL HIGH (ref 4.8–5.6)
Mean Plasma Glucose: 125.5 mg/dL

## 2020-09-19 LAB — PROTEIN / CREATININE RATIO, URINE
Creatinine, Urine: 44 mg/dL
Protein Creatinine Ratio: 0.41 mg/mg{Cre} — ABNORMAL HIGH (ref 0.00–0.15)
Total Protein, Urine: 18 mg/dL

## 2020-09-19 LAB — OB RESULTS CONSOLE GC/CHLAMYDIA: Chlamydia: NEGATIVE

## 2020-09-19 MED ORDER — TERBUTALINE SULFATE 1 MG/ML IJ SOLN
0.2500 mg | Freq: Once | INTRAMUSCULAR | Status: AC | PRN
  Administered 2020-09-20: 0.25 mg via SUBCUTANEOUS
  Filled 2020-09-19: qty 1

## 2020-09-19 MED ORDER — PHENYLEPHRINE 40 MCG/ML (10ML) SYRINGE FOR IV PUSH (FOR BLOOD PRESSURE SUPPORT): 80.0000 ug | PREFILLED_SYRINGE | INTRAVENOUS | Status: DC | PRN

## 2020-09-19 MED ORDER — BUTORPHANOL TARTRATE 1 MG/ML IJ SOLN
1.0000 mg | INTRAMUSCULAR | Status: DC | PRN
  Administered 2020-09-19: 1 mg via INTRAVENOUS
  Filled 2020-09-19: qty 1

## 2020-09-19 MED ORDER — LABETALOL HCL 5 MG/ML IV SOLN: 40.0000 mg | INTRAVENOUS | Status: DC | PRN

## 2020-09-19 MED ORDER — LABETALOL HCL 5 MG/ML IV SOLN: 20.0000 mg | INTRAVENOUS | Status: DC | PRN

## 2020-09-19 MED ORDER — MISOPROSTOL 25 MCG QUARTER TABLET
25.0000 ug | ORAL_TABLET | ORAL | Status: DC | PRN
  Administered 2020-09-19 (×2): 25 ug via BUCCAL
  Filled 2020-09-19 (×2): qty 1

## 2020-09-19 MED ORDER — OXYTOCIN-SODIUM CHLORIDE 30-0.9 UT/500ML-% IV SOLN
2.5000 [IU]/h | INTRAVENOUS | Status: DC
  Administered 2020-09-20: 30 [IU] via INTRAVENOUS

## 2020-09-19 MED ORDER — LIDOCAINE HCL (PF) 1 % IJ SOLN
INTRAMUSCULAR | Status: AC
  Filled 2020-09-19: qty 30

## 2020-09-19 MED ORDER — LACTATED RINGERS IV SOLN: 500.0000 mL | INTRAVENOUS | Status: DC | PRN

## 2020-09-19 MED ORDER — OXYTOCIN BOLUS FROM INFUSION: 333.0000 mL | Freq: Once | INTRAVENOUS | Status: DC

## 2020-09-19 MED ORDER — OXYTOCIN-SODIUM CHLORIDE 30-0.9 UT/500ML-% IV SOLN: 1.0000 m[IU]/min | INTRAVENOUS | Status: DC

## 2020-09-19 MED ORDER — SODIUM CHLORIDE 0.9 % IV SOLN
5.0000 10*6.[IU] | Freq: Once | INTRAVENOUS | Status: AC
  Administered 2020-09-19: 5 10*6.[IU] via INTRAVENOUS
  Filled 2020-09-19: qty 5

## 2020-09-19 MED ORDER — FENTANYL 2.5 MCG/ML W/ROPIVACAINE 0.15% IN NS 100 ML EPIDURAL (ARMC)
EPIDURAL | Status: AC
  Filled 2020-09-19: qty 100

## 2020-09-19 MED ORDER — EPHEDRINE 5 MG/ML INJ: 10.0000 mg | INTRAVENOUS | Status: DC | PRN

## 2020-09-19 MED ORDER — OXYTOCIN 10 UNIT/ML IJ SOLN
INTRAMUSCULAR | Status: AC
  Filled 2020-09-19: qty 2

## 2020-09-19 MED ORDER — LABETALOL HCL 5 MG/ML IV SOLN: 80.0000 mg | INTRAVENOUS | Status: DC | PRN

## 2020-09-19 MED ORDER — OXYCODONE-ACETAMINOPHEN 5-325 MG PO TABS: 2.0000 | ORAL_TABLET | ORAL | Status: DC | PRN

## 2020-09-19 MED ORDER — PENICILLIN G POT IN DEXTROSE 60000 UNIT/ML IV SOLN
3.0000 10*6.[IU] | INTRAVENOUS | Status: DC
  Administered 2020-09-20 (×2): 3 10*6.[IU] via INTRAVENOUS
  Filled 2020-09-19 (×2): qty 50

## 2020-09-19 MED ORDER — FENTANYL 2.5 MCG/ML W/ROPIVACAINE 0.15% IN NS 100 ML EPIDURAL (ARMC)
12.0000 mL/h | EPIDURAL | Status: DC
  Administered 2020-09-19 – 2020-09-20 (×2): 12 mL/h via EPIDURAL
  Filled 2020-09-19: qty 100

## 2020-09-19 MED ORDER — OXYTOCIN-SODIUM CHLORIDE 30-0.9 UT/500ML-% IV SOLN
INTRAVENOUS | Status: AC
  Administered 2020-09-20: 1 m[IU]/min via INTRAVENOUS
  Filled 2020-09-19: qty 1000

## 2020-09-19 MED ORDER — LACTATED RINGERS IV SOLN: INTRAVENOUS | Status: DC

## 2020-09-19 MED ORDER — DIPHENHYDRAMINE HCL 50 MG/ML IJ SOLN: 12.5000 mg | INTRAMUSCULAR | Status: DC | PRN

## 2020-09-19 MED ORDER — LACTATED RINGERS IV SOLN
500.0000 mL | Freq: Once | INTRAVENOUS | Status: AC
  Administered 2020-09-19: 500 mL via INTRAVENOUS

## 2020-09-19 MED ORDER — AMMONIA AROMATIC IN INHA
RESPIRATORY_TRACT | Status: AC
  Filled 2020-09-19: qty 10

## 2020-09-19 MED ORDER — HYDRALAZINE HCL 20 MG/ML IJ SOLN: 10.0000 mg | INTRAMUSCULAR | Status: DC | PRN

## 2020-09-19 MED ORDER — HYDROXYZINE HCL 25 MG PO TABS
25.0000 mg | ORAL_TABLET | Freq: Three times a day (TID) | ORAL | Status: DC | PRN
  Administered 2020-09-19 – 2020-09-20 (×2): 25 mg via ORAL
  Filled 2020-09-19 (×3): qty 1

## 2020-09-19 MED ORDER — ONDANSETRON HCL 4 MG/2ML IJ SOLN
4.0000 mg | Freq: Four times a day (QID) | INTRAMUSCULAR | Status: DC | PRN
  Administered 2020-09-19: 4 mg via INTRAVENOUS
  Filled 2020-09-19: qty 2

## 2020-09-19 MED ORDER — ACETAMINOPHEN 325 MG PO TABS
650.0000 mg | ORAL_TABLET | ORAL | Status: DC | PRN
  Administered 2020-09-20: 650 mg via ORAL
  Filled 2020-09-19: qty 2

## 2020-09-19 MED ORDER — DESVENLAFAXINE SUCCINATE ER 100 MG PO TB24
100.0000 mg | ORAL_TABLET | Freq: Every day | ORAL | Status: DC
  Administered 2020-09-20: 100 mg via ORAL
  Filled 2020-09-19: qty 1

## 2020-09-19 MED ORDER — LIDOCAINE HCL (PF) 1 % IJ SOLN: 30.0000 mL | INTRAMUSCULAR | Status: DC | PRN

## 2020-09-19 MED ORDER — SOD CITRATE-CITRIC ACID 500-334 MG/5ML PO SOLN
30.0000 mL | ORAL | Status: DC | PRN
  Filled 2020-09-19: qty 15

## 2020-09-19 MED ORDER — OXYCODONE-ACETAMINOPHEN 5-325 MG PO TABS: 1.0000 | ORAL_TABLET | ORAL | Status: DC | PRN

## 2020-09-19 MED ORDER — MISOPROSTOL 200 MCG PO TABS
ORAL_TABLET | ORAL | Status: AC
  Administered 2020-09-19: 25 ug via VAGINAL
  Filled 2020-09-19: qty 4

## 2020-09-19 MED ORDER — MISOPROSTOL 25 MCG QUARTER TABLET
25.0000 ug | ORAL_TABLET | ORAL | Status: DC | PRN
  Administered 2020-09-19: 25 ug via VAGINAL
  Filled 2020-09-19 (×2): qty 1

## 2020-09-19 NOTE — Anesthesia Procedure Notes (Signed)
Epidural Patient location during procedure: OB Start time: 09/19/2020 11:43 PM End time: 09/19/2020 11:47 PM  Staffing Anesthesiologist: Lenard Simmer, MD Performed: anesthesiologist   Preanesthetic Checklist Completed: patient identified, IV checked, site marked, risks and benefits discussed, surgical consent, monitors and equipment checked, pre-op evaluation and timeout performed  Epidural Patient position: sitting Prep: ChloraPrep Patient monitoring: heart rate, continuous pulse ox and blood pressure Approach: midline Location: L3-L4 Injection technique: LOR saline  Needle:  Needle type: Tuohy  Needle gauge: 17 G Needle length: 9 cm and 9 Needle insertion depth: 5 cm Catheter type: closed end flexible Catheter size: 19 Gauge Catheter at skin depth: 10 cm Test dose: negative and 1.5% lidocaine with Epi 1:200 K  Assessment Sensory level: T10 Events: blood not aspirated, injection not painful, no injection resistance, no paresthesia and negative IV test  Additional Notes 1st attempt Pt. Evaluated and documentation done after procedure finished. Patient identified. Risks/Benefits/Options discussed with patient including but not limited to bleeding, infection, nerve damage, paralysis, failed block, incomplete pain control, headache, blood pressure changes, nausea, vomiting, reactions to medication both or allergic, itching and postpartum back pain. Confirmed with bedside nurse the patient's most recent platelet count. Confirmed with patient that they are not currently taking any anticoagulation, have any bleeding history or any family history of bleeding disorders. Patient expressed understanding and wished to proceed. All questions were answered. Sterile technique was used throughout the entire procedure. Please see nursing notes for vital signs. Test dose was given through epidural catheter and negative prior to continuing to dose epidural or start infusion. Warning signs of high  block given to the patient including shortness of breath, tingling/numbness in hands, complete motor block, or any concerning symptoms with instructions to call for help. Patient was given instructions on fall risk and not to get out of bed. All questions and concerns addressed with instructions to call with any issues or inadequate analgesia.   Patient tolerated the insertion well without immediate complications.Reason for block:procedure for pain

## 2020-09-19 NOTE — Progress Notes (Signed)
Labor Progress Note  Angela Bryan is a 29 y.o. G1P0 at [redacted]w[redacted]d by LMP admitted for induction of labor due to preeclampsia and A1GDM.  Subjective: feeling mild cramping   Objective: BP 120/81    Pulse 98    Temp 98.6 F (37 C) (Oral)    Resp 18    Ht 5\' 2"  (1.575 m)    Wt 85.3 kg    LMP 01/01/2020 (Approximate)    BMI 34.39 kg/m  Notable VS details: reviewed, no severe range b/p's   Fetal Assessment: FHT:  FHR: 140 bpm, variability: moderate,  accelerations:  Present,  decelerations:  Absent Category/reactivity:  Category I UC:   irregular, every 2-6 minutes SVE:   Deferred  Membrane status: Intact Amniotic color: N/A  Labs: Lab Results  Component Value Date   WBC 9.2 09/19/2020   HGB 11.9 (L) 09/19/2020   HCT 34.5 (L) 09/19/2020   MCV 81.4 09/19/2020   PLT 221 09/19/2020    Assessment / Plan: Induction of labor d/t preeclampsia and A1GDM, s/p 1 dose of vaginal and buccal misoprostol  Labor: Next dose of misporostol due after 1930.  Will recheck cervix, plan to give additional dose of misoprostol if cervical ripening indicated  Preeclampsia:  no signs or symptoms of toxicity Fetal Wellbeing:  Category I Pain Control:  Labor support without medications I/D:  n/a  09/21/2020, CNM 09/19/2020, 6:46 PM

## 2020-09-19 NOTE — Progress Notes (Signed)
Pt presents to L&D for increased blood pressures. Pt is a G1P0 37w. Pt reports taking blood pressures at home and having 140s/100s. Pt reports a slight headache, denies blurry vision, or RUQ pain. Pt denies LOF or vaginal bleeding, and reports occasional ctx. BP cycling, initial BP 130/95, repeat BP 115/80. A.Mackie, CNM aware of pt.

## 2020-09-19 NOTE — H&P (Signed)
OB History & Physical   History of Present Illness:  Chief Complaint: elevated b/p at home, intermittent mild headache   HPI:  Angela Bryan is a 29 y.o. G1P0 female at [redacted]w[redacted]d dated by LMP of 01/01/2020, c/w early [place] at 7ww2d.  She presents to L&D for elevated b/p's at home and intermittent mild headache.   Angela Bryan states her b/p at home was 140/101 and she has been having an intermittent mild headache all day long.  She was originally scheduled for an IOL tomorrow for preeclampsia w/o severe features and A1GDM tomorrow.  Will proceed with admission for IOL today.    Reports active fetal movement  Contractions: irregular cramping  LOF/SROM: denies  Vaginal bleeding: denies  Headache: denies currently Visual changes: denies RUQ pain: denies   Pregnancy Issues: 1. Preeclampsia without severe features  2. A1GDM - insulin ordered but never started  3. Prepregnancy BMI >30 4. Agoraphobia and anxiety - has not been able to attend in person clinic visits since 28 weeks.  Started on Pristiq and vistaril.  5. Resolved placenta previa - resolved at 28 weeks  6. History of asthma   Patient Active Problem List   Diagnosis Date Noted  . Pre-eclampsia during pregnancy in third trimester, antepartum 09/19/2020  . Gestational diabetes, diet controlled 09/19/2020  . Supervision of high risk pregnancy, antepartum, third trimester 02/03/2020  . GERD (gastroesophageal reflux disease) 05/14/2018  . Generalized anxiety disorder 04/05/2018     Maternal Medical History:   Past Medical History:  Diagnosis Date  . Allergy   . Anxiety   . Asthma   . Depression   . Diabetes mellitus without complication (HCC)   . Headache   . Mental disorder   . Pelvic inflammatory disease 02/06/2017  . PONV (postoperative nausea and vomiting)     No past surgical history on file.  Allergies  Allergen Reactions  . Green Coffee Bean-Yerba Mate Anaphylaxis    Has epi pen   . Latex Rash    Prior to  Admission medications   Medication Sig Start Date End Date Taking? Authorizing Provider  acetaminophen (TYLENOL) 500 MG tablet Take 500 mg by mouth every 6 (six) hours as needed.   Yes [provider]  desvenlafaxine (PRISTIQ) 100 MG 24 hr tablet Take 100 mg by mouth daily.   Yes [provider]  fluticasone (FLONASE) 50 MCG/ACT nasal spray Place 2 sprays into both nostrils daily. 12/26/19  Yes Donita Brooks, MD  hydrOXYzine (ATARAX/VISTARIL) 10 MG tablet Take 10 mg by mouth every 4 (four) hours as needed for anxiety (take 1-2 tablets as needed for anxiety).    Yes [provider]     Prenatal care site:  [facility]  Social History: She  reports that she has never smoked. She has never used smokeless tobacco. She reports current alcohol use. She reports that she does not use drugs.  Family History: Denies significant family history, no history of gyn cancers.    Review of Systems: A full review of systems was performed and negative except as noted in the HPI.     Physical Exam:  Vital Signs: BP 121/89 (BP Location: Right Arm)   Pulse 100   Temp 98.6 F (37 C) (Oral)   Resp 18   Ht 5\' 2"  (1.575 m)   Wt 85.3 kg   LMP 01/01/2020 (Approximate)   BMI 34.39 kg/m  Physical Exam  General: no acute distress.  HEENT: normocephalic, atraumatic Heart: regular rate &  rhythm.  No murmurs/rubs/gallops Lungs: clear to auscultation bilaterally, normal respiratory effort Abdomen: soft, gravid, non-tender;  EFW: 3087g on 09/17/2020 Pelvic:   External: Normal external female genitalia  Cervix: Dilation: 1 / Effacement (%): 80 / Station: -2    Extremities: non-tender, symmetric, mild dependent edema bilaterally.  DTRs: 2+/2+  Neurologic: Alert & oriented x 3.    Results for orders placed or performed during the hospital encounter of 09/19/20 (from the past 24 hour(s))  CBC     Status: Abnormal   Collection Time: 09/19/20  2:02 PM  Result Value Ref  Range   WBC 9.2 4.0 - 10.5 K/uL   RBC 4.24 3.87 - 5.11 MIL/uL   Hemoglobin 11.9 (L) 12.0 - 15.0 g/dL   HCT 56.2 (L) 36 - 46 %   MCV 81.4 80.0 - 100.0 fL   MCH 28.1 26.0 - 34.0 pg   MCHC 34.5 30.0 - 36.0 g/dL   RDW 13.0 86.5 - 78.4 %   Platelets 221 150 - 400 K/uL   nRBC 0.0 0.0 - 0.2 %  Comprehensive metabolic panel     Status: Abnormal   Collection Time: 09/19/20  2:02 PM  Result Value Ref Range   Sodium 137 135 - 145 mmol/L   Potassium 3.9 3.5 - 5.1 mmol/L   Chloride 106 98 - 111 mmol/L   CO2 19 (L) 22 - 32 mmol/L   Glucose, Bld 80 70 - 99 mg/dL   BUN 7 6 - 20 mg/dL   Creatinine, Ser 6.96 0.44 - 1.00 mg/dL   Calcium 9.5 8.9 - 29.5 mg/dL   Total Protein 7.0 6.5 - 8.1 g/dL   Albumin 3.3 (L) 3.5 - 5.0 g/dL   AST 20 15 - 41 U/L   ALT 13 0 - 44 U/L   Alkaline Phosphatase 116 38 - 126 U/L   Total Bilirubin 0.5 0.3 - 1.2 mg/dL   GFR calc non Af Amer >60 >60 mL/min   GFR calc Af Amer >60 >60 mL/min   Anion gap 12 5 - 15  Protein / creatinine ratio, urine     Status: Abnormal   Collection Time: 09/19/20  2:02 PM  Result Value Ref Range   Creatinine, Urine 44 mg/dL   Total Protein, Urine 18 mg/dL   Protein Creatinine Ratio 0.41 (H) 0.00 - 0.15 mg/mg[Cre]  Type and screen     Status: None   Collection Time: 09/19/20  2:02 PM  Result Value Ref Range   ABO/RH(D) A POS    Antibody Screen NEG    Sample Expiration      09/22/2020,2359 Performed at [facility], [address] [place], [place] [address]   Respiratory Panel by RT PCR (Flu A&B, Covid) - Nasopharyngeal Swab     Status: None   Collection Time: 09/19/20  2:03 PM   Specimen: Nasopharyngeal Swab  Result Value Ref Range   SARS Coronavirus 2 by RT PCR NEGATIVE NEGATIVE   Influenza A by PCR NEGATIVE NEGATIVE   Influenza B by PCR NEGATIVE NEGATIVE  ABO/Rh     Status: None   Collection Time: 09/19/20  3:07 PM  Result Value Ref Range   ABO/RH(D)      A POS Performed at [facility], [address] [place], [place] [address]     Pertinent Results:  Prenatal Labs: Blood type/Rh A Pos   Antibody screen neg  Rubella Immune  Varicella Immune  RPR NR  HBsAg Neg  HIV NR  GC neg  Chlamydia neg  Genetic screening negative  1 hour GTT 210  3 hour GTT N/A  GBS Pos   FHT: 145 bpm, moderate variability, accels present, decels absent  TOCO: irregular, every 3-8 minutes SVE:  Dilation: 1 / Effacement (%): 80 / Station: -2    Cephalic by Leopolds and SVE. Cephalic on [place] 09/17/2020  US OB Comp + 14 Wk  Result Date: 09/17/2020 CLINICAL DATA:  Pregnancy.  Hypertension EXAM: OBSTETRICAL ULTRASOUND >14 WKS FINDINGS: Number of Fetuses: 1 Heart Rate:  158 bpm Movement: Yes Presentation: Cephalic Previa: No Placental Location: Posterior Amniotic Fluid (Subjective): Normal Amniotic Fluid (Objective): AFI = 12.2 cm (5%ile= 7.7 cm, 95%= 24.9 cm for 36 wks) FETAL BIOMETRY BPD: 9.2cm 37w 3d HC:   32.3cm 36w 3d AC:   33.9cm 37w 5d FL:   6.9cm 35w 2d Current Mean GA: 36w 5d [place] EDC: 10/10/2020 Assigned GA:  36w 5d Assigned EDC: 10/10/2020 Estimated Fetal Weight:  3,087g 62%ile FETAL ANATOMY Lateral Ventricles: Appears normal Thalami/CSP: Appears normal Posterior Fossa:  Not visualized Nuchal Region: Not visualized Upper Lip: Appears normal Spine: Not visualized 4 Chamber Heart on Left: Appears normal LVOT: Appears normal RVOT: Not visualized Stomach on Left: Appears normal 3 Vessel Cord: Appears normal Cord Insertion site: Not visualized Kidneys: Appears normal Bladder: Appears normal Extremities: Appears normal Sex: Previously Seen Technically difficult due to: Fetal positioning and advanced gestational age Maternal Findings: Cervix:  Obscured. IMPRESSION: 1. Single live intrauterine gestation in cephalic presentation. Assigned gestational age of [redacted] weeks 5 days. 2. Amniotic fluid index of 12.2 cm, within normal limits. 3. Estimated fetal weight is in the 62nd percentile. 4. Limited fetal anatomic survey secondary  to advanced gestational age. Electronically Signed   By: Duanne Guess D.O.   On: 09/17/2020 12:43    Assessment:  Nashonda Liara Holm is a 29 y.o. G1P0 female at [redacted]w[redacted]d with preeclampsia w/o severe features, A1GDM.   Plan:  1. Admit to Labor & Delivery; consents reviewed and obtained - Covid admission screen   2. Fetal Well being  - Fetal Tracing: cat 1 - Group B Streptococcus ppx indicated: GBS Pos.  Will start prophylaxis with PCN with onset of labor, ROM, or initiation of oxytocin - Presentation: cephalic confirmed by SVE   3. Routine OB: - Prenatal labs reviewed, as above - Rh pos - CBC, T&S, RPR on admit - GDM diet, IVF  4. Induction of labor  -  Contractions monitored with external toco -  Pelvis adequate for trial of labor  -  Plan for induction with misoprostol -> oxytocin -  Plan for  continuous fetal monitoring -  Maternal pain control as desired; planning  - Anticipate vaginal delivery   5. Preeclampsia w/o SF - discussed monitoring of blood pressures in labor  - reviewed antihypertensive medications for management of severe range blood pressures - discussed possibility of magnesium sulfate infusion for signs of worsening preeclampsia.  - reviewed warning signs to notify provider/RN with  6. A1GDM - Blood glucos 80 on admission  - Will check blood glucose every 4 hours  - Discussed possible insulin drip for management of diabetes in labor   7. Post Partum Planning: - Infant feeding: TBD - Contraception: TBD - Tdap vaccine: declined  - Flu vaccine: declined   Gustavo Lah, CNM 09/19/20 3:54 PM  Margaretmary Eddy, CNM Certified Nurse Midwife [facility]

## 2020-09-20 ENCOUNTER — Encounter: Payer: Self-pay | Admitting: Obstetrics and Gynecology

## 2020-09-20 ENCOUNTER — Encounter: Admission: EM | Disposition: A | Discharge status: Home / Self Care | Payer: Self-pay | Source: Home / Self Care

## 2020-09-20 LAB — CBC
HCT: 24.5 % — ABNORMAL LOW (ref 36.0–46.0)
Hemoglobin: 8.1 g/dL — ABNORMAL LOW (ref 12.0–15.0)
MCH: 27.8 pg (ref 26.0–34.0)
MCHC: 33.1 g/dL (ref 30.0–36.0)
MCV: 84.2 fL (ref 80.0–100.0)
Platelets: 162 10*3/uL (ref 150–400)
RBC: 2.91 MIL/uL — ABNORMAL LOW (ref 3.87–5.11)
RDW: 14.6 % (ref 11.5–15.5)
WBC: 12.6 10*3/uL — ABNORMAL HIGH (ref 4.0–10.5)
nRBC: 0 % (ref 0.0–0.2)

## 2020-09-20 LAB — GLUCOSE, CAPILLARY
Glucose-Capillary: 85 mg/dL (ref 70–99)
Glucose-Capillary: 91 mg/dL (ref 70–99)
Glucose-Capillary: 80 mg/dL (ref 70–99)

## 2020-09-20 LAB — COMPREHENSIVE METABOLIC PANEL
ALT: 9 U/L (ref 0–44)
AST: 31 U/L (ref 15–41)
Albumin: 2.1 g/dL — ABNORMAL LOW (ref 3.5–5.0)
Alkaline Phosphatase: 77 U/L (ref 38–126)
Anion gap: 10 (ref 5–15)
BUN: 8 mg/dL (ref 6–20)
CO2: 20 mmol/L — ABNORMAL LOW (ref 22–32)
Calcium: 8 mg/dL — ABNORMAL LOW (ref 8.9–10.3)
Chloride: 105 mmol/L (ref 98–111)
Creatinine, Ser: 0.8 mg/dL (ref 0.44–1.00)
GFR calc Af Amer: >60 mL/min (ref >60)
GFR calc non Af Amer: >60 mL/min (ref >60)
Glucose, Bld: 135 mg/dL — ABNORMAL HIGH (ref 70–99)
Potassium: 3.7 mmol/L (ref 3.5–5.1)
Sodium: 135 mmol/L (ref 135–145)
Total Bilirubin: 0.7 mg/dL (ref 0.3–1.2)
Total Protein: 4.7 g/dL — ABNORMAL LOW (ref 6.5–8.1)

## 2020-09-20 LAB — RPR: RPR Ser Ql: NONREACTIVE

## 2020-09-20 SURGERY — Surgical Case
Anesthesia: Epidural

## 2020-09-20 MED ORDER — MORPHINE SULFATE (PF) 0.5 MG/ML IJ SOLN
INTRAMUSCULAR | Status: DC | PRN
Start: 2020-09-20 — End: 2020-09-20
  Administered 2020-09-20: 3 mg via EPIDURAL

## 2020-09-20 MED ORDER — SCOPOLAMINE 1 MG/3DAYS TD PT72
1.0000 | MEDICATED_PATCH | Freq: Once | TRANSDERMAL | Status: DC
  Administered 2020-09-20: 1.5 mg via TRANSDERMAL
  Filled 2020-09-20: qty 1

## 2020-09-20 MED ORDER — METHYLERGONOVINE MALEATE 0.2 MG/ML IJ SOLN
INTRAMUSCULAR | Status: AC
  Filled 2020-09-20: qty 1

## 2020-09-20 MED ORDER — PRENATAL MULTIVITAMIN CH
1.0000 | ORAL_TABLET | Freq: Every day | ORAL | Status: DC
  Administered 2020-09-21 – 2020-09-22 (×2): 1 via ORAL
  Filled 2020-09-20 (×2): qty 1

## 2020-09-20 MED ORDER — SODIUM CHLORIDE 0.9% FLUSH
50.0000 mL | Freq: Once | INTRAVENOUS | Status: DC
  Filled 2020-09-20: qty 51

## 2020-09-20 MED ORDER — DIPHENHYDRAMINE HCL 25 MG PO CAPS: 25.0000 mg | ORAL_CAPSULE | Freq: Four times a day (QID) | ORAL | Status: DC | PRN

## 2020-09-20 MED ORDER — EPHEDRINE 5 MG/ML INJ
INTRAVENOUS | Status: AC
  Filled 2020-09-20: qty 10

## 2020-09-20 MED ORDER — SOD CITRATE-CITRIC ACID 500-334 MG/5ML PO SOLN
30.0000 mL | ORAL | Status: AC
  Administered 2020-09-20: 30 mL via ORAL

## 2020-09-20 MED ORDER — FENTANYL CITRATE (PF) 100 MCG/2ML IJ SOLN
INTRAMUSCULAR | Status: AC
  Filled 2020-09-20: qty 2

## 2020-09-20 MED ORDER — SIMETHICONE 80 MG PO CHEW
80.0000 mg | CHEWABLE_TABLET | Freq: Three times a day (TID) | ORAL | Status: DC
  Administered 2020-09-21 – 2020-09-22 (×6): 80 mg via ORAL
  Filled 2020-09-20 (×6): qty 1

## 2020-09-20 MED ORDER — LIDOCAINE HCL (PF) 1 % IJ SOLN
INTRAMUSCULAR | Status: DC | PRN
  Administered 2020-09-19: 2 mL via SUBCUTANEOUS

## 2020-09-20 MED ORDER — KETOROLAC TROMETHAMINE 30 MG/ML IJ SOLN
30.0000 mg | Freq: Four times a day (QID) | INTRAMUSCULAR | Status: DC
  Administered 2020-09-20: 30 mg via INTRAVENOUS
  Filled 2020-09-20 (×2): qty 1

## 2020-09-20 MED ORDER — NIFEDIPINE ER OSMOTIC RELEASE 30 MG PO TB24
30.0000 mg | ORAL_TABLET | Freq: Every day | ORAL | Status: DC
  Filled 2020-09-20: qty 1

## 2020-09-20 MED ORDER — BISACODYL 10 MG RE SUPP: 10.0000 mg | Freq: Every day | RECTAL | Status: DC | PRN

## 2020-09-20 MED ORDER — CEFAZOLIN SODIUM-DEXTROSE 2-4 GM/100ML-% IV SOLN
2.0000 g | INTRAVENOUS | Status: DC
  Filled 2020-09-20: qty 100

## 2020-09-20 MED ORDER — LOPERAMIDE HCL 2 MG PO CAPS
4.0000 mg | ORAL_CAPSULE | ORAL | Status: DC | PRN
  Filled 2020-09-20: qty 2

## 2020-09-20 MED ORDER — SODIUM CHLORIDE 0.9 % IV SOLN
500.0000 mg | INTRAVENOUS | Status: AC
  Administered 2020-09-20: 500 mg via INTRAVENOUS
  Filled 2020-09-20: qty 500

## 2020-09-20 MED ORDER — NALBUPHINE HCL 10 MG/ML IJ SOLN: 5.0000 mg | Freq: Once | INTRAMUSCULAR | Status: DC | PRN

## 2020-09-20 MED ORDER — WITCH HAZEL-GLYCERIN EX PADS: 1.0000 "application " | MEDICATED_PAD | CUTANEOUS | Status: DC | PRN

## 2020-09-20 MED ORDER — OXYTOCIN-SODIUM CHLORIDE 30-0.9 UT/500ML-% IV SOLN
2.5000 [IU]/h | INTRAVENOUS | Status: DC
  Administered 2020-09-20 (×2): 2.5 [IU]/h via INTRAVENOUS

## 2020-09-20 MED ORDER — DIBUCAINE (PERIANAL) 1 % EX OINT: 1.0000 "application " | TOPICAL_OINTMENT | CUTANEOUS | Status: DC | PRN

## 2020-09-20 MED ORDER — SIMETHICONE 80 MG PO CHEW: 80.0000 mg | CHEWABLE_TABLET | ORAL | Status: DC | PRN

## 2020-09-20 MED ORDER — ACETAMINOPHEN 500 MG PO TABS
1000.0000 mg | ORAL_TABLET | Freq: Four times a day (QID) | ORAL | Status: DC
  Administered 2020-09-20 – 2020-09-22 (×7): 1000 mg via ORAL
  Filled 2020-09-20 (×5): qty 2

## 2020-09-20 MED ORDER — FERROUS SULFATE 325 (65 FE) MG PO TABS
325.0000 mg | ORAL_TABLET | Freq: Two times a day (BID) | ORAL | Status: DC
  Administered 2020-09-21 – 2020-09-22 (×4): 325 mg via ORAL
  Filled 2020-09-20 (×4): qty 1

## 2020-09-20 MED ORDER — SODIUM CHLORIDE (PF) 0.9 % IJ SOLN
INTRAMUSCULAR | Status: AC
  Filled 2020-09-20: qty 50

## 2020-09-20 MED ORDER — GABAPENTIN 300 MG PO CAPS
300.0000 mg | ORAL_CAPSULE | Freq: Every day | ORAL | Status: DC
  Administered 2020-09-20: 300 mg via ORAL
  Filled 2020-09-20: qty 1

## 2020-09-20 MED ORDER — SENNOSIDES-DOCUSATE SODIUM 8.6-50 MG PO TABS
2.0000 | ORAL_TABLET | ORAL | Status: DC
  Administered 2020-09-21 – 2020-09-22 (×2): 2 via ORAL
  Filled 2020-09-20 (×2): qty 2

## 2020-09-20 MED ORDER — CARBOPROST TROMETHAMINE 250 MCG/ML IM SOLN
INTRAMUSCULAR | Status: AC
  Filled 2020-09-20: qty 1

## 2020-09-20 MED ORDER — NALBUPHINE HCL 10 MG/ML IJ SOLN: 5.0000 mg | INTRAMUSCULAR | Status: DC | PRN

## 2020-09-20 MED ORDER — SIMETHICONE 80 MG PO CHEW
80.0000 mg | CHEWABLE_TABLET | ORAL | Status: DC
  Administered 2020-09-22: 80 mg via ORAL
  Filled 2020-09-20: qty 1

## 2020-09-20 MED ORDER — PANTOPRAZOLE SODIUM 40 MG PO TBEC
40.0000 mg | DELAYED_RELEASE_TABLET | Freq: Every day | ORAL | Status: DC
  Administered 2020-09-20: 40 mg via ORAL
  Filled 2020-09-20 (×2): qty 1

## 2020-09-20 MED ORDER — PROMETHAZINE HCL 25 MG/ML IJ SOLN
INTRAMUSCULAR | Status: AC
  Filled 2020-09-20: qty 1

## 2020-09-20 MED ORDER — NALOXONE HCL 0.4 MG/ML IJ SOLN: 0.4000 mg | INTRAMUSCULAR | Status: DC | PRN

## 2020-09-20 MED ORDER — SODIUM CHLORIDE 0.9 % IV SOLN
INTRAVENOUS | Status: DC | PRN
  Administered 2020-09-20: 60 mL

## 2020-09-20 MED ORDER — MENTHOL 3 MG MT LOZG
1.0000 | LOZENGE | OROMUCOSAL | Status: DC | PRN
  Filled 2020-09-20: qty 9

## 2020-09-20 MED ORDER — LIDOCAINE HCL (PF) 2 % IJ SOLN
INTRAMUSCULAR | Status: DC | PRN
  Administered 2020-09-20: 200 mg via INTRADERMAL
  Administered 2020-09-20 (×3): 100 mg via INTRADERMAL

## 2020-09-20 MED ORDER — DIPHENHYDRAMINE HCL 50 MG/ML IJ SOLN: 12.5000 mg | INTRAMUSCULAR | Status: DC | PRN

## 2020-09-20 MED ORDER — DIPHENHYDRAMINE HCL 25 MG PO CAPS: 25.0000 mg | ORAL_CAPSULE | ORAL | Status: DC | PRN

## 2020-09-20 MED ORDER — BUPIVACAINE LIPOSOME 1.3 % IJ SUSP: 20.0000 mL | Freq: Once | INTRAMUSCULAR | Status: DC

## 2020-09-20 MED ORDER — KETOROLAC TROMETHAMINE 30 MG/ML IJ SOLN
30.0000 mg | Freq: Four times a day (QID) | INTRAMUSCULAR | Status: AC
  Administered 2020-09-20 – 2020-09-21 (×3): 30 mg via INTRAVENOUS
  Filled 2020-09-20 (×2): qty 1

## 2020-09-20 MED ORDER — SODIUM CHLORIDE 0.9 % IV SOLN
INTRAVENOUS | Status: DC | PRN
  Administered 2020-09-19 (×2): 5 mL via EPIDURAL

## 2020-09-20 MED ORDER — LIDOCAINE-EPINEPHRINE (PF) 1.5 %-1:200000 IJ SOLN
INTRAMUSCULAR | Status: DC | PRN
  Administered 2020-09-19: 3 mL via EPIDURAL

## 2020-09-20 MED ORDER — BUPIVACAINE LIPOSOME 1.3 % IJ SUSP
INTRAMUSCULAR | Status: AC
  Filled 2020-09-20: qty 20

## 2020-09-20 MED ORDER — TETANUS-DIPHTH-ACELL PERTUSSIS 5-2.5-18.5 LF-MCG/0.5 IM SUSP: 0.5000 mL | Freq: Once | INTRAMUSCULAR | Status: DC

## 2020-09-20 MED ORDER — LACTATED RINGERS IV SOLN: INTRAVENOUS | Status: DC

## 2020-09-20 MED ORDER — SODIUM CHLORIDE 0.9% FLUSH: 3.0000 mL | INTRAVENOUS | Status: DC | PRN

## 2020-09-20 MED ORDER — BUPIVACAINE HCL (PF) 0.25 % IJ SOLN
INTRAMUSCULAR | Status: DC | PRN
  Administered 2020-09-20: 30 mL

## 2020-09-20 MED ORDER — SOD CITRATE-CITRIC ACID 500-334 MG/5ML PO SOLN: 30.0000 mL | ORAL | Status: DC

## 2020-09-20 MED ORDER — MEPERIDINE HCL 25 MG/ML IJ SOLN: 6.2500 mg | INTRAMUSCULAR | Status: DC | PRN

## 2020-09-20 MED ORDER — OXYCODONE HCL 5 MG PO TABS
5.0000 mg | ORAL_TABLET | ORAL | Status: DC | PRN
  Administered 2020-09-21: 10 mg via ORAL
  Filled 2020-09-20: qty 2

## 2020-09-20 MED ORDER — OXYCODONE HCL 5 MG PO TABS: 5.0000 mg | ORAL_TABLET | ORAL | Status: DC | PRN

## 2020-09-20 MED ORDER — ACETAMINOPHEN 500 MG PO TABS
1000.0000 mg | ORAL_TABLET | Freq: Four times a day (QID) | ORAL | Status: DC
  Administered 2020-09-20: 1000 mg via ORAL
  Filled 2020-09-20 (×3): qty 2

## 2020-09-20 MED ORDER — COCONUT OIL OIL: 1.0000 "application " | TOPICAL_OIL | Status: DC | PRN

## 2020-09-20 MED ORDER — NALOXONE HCL 4 MG/10ML IJ SOLN
1.0000 ug/kg/h | INTRAVENOUS | Status: DC | PRN
  Filled 2020-09-20: qty 5

## 2020-09-20 MED ORDER — IBUPROFEN 800 MG PO TABS
800.0000 mg | ORAL_TABLET | Freq: Four times a day (QID) | ORAL | Status: DC
  Administered 2020-09-21 – 2020-09-22 (×4): 800 mg via ORAL
  Filled 2020-09-20 (×4): qty 1

## 2020-09-20 MED ORDER — CARBOPROST TROMETHAMINE 250 MCG/ML IM SOLN
INTRAMUSCULAR | Status: DC | PRN
  Administered 2020-09-20: 250 ug via INTRAMUSCULAR

## 2020-09-20 MED ORDER — PROMETHAZINE HCL 25 MG/ML IJ SOLN
INTRAMUSCULAR | Status: DC | PRN
  Administered 2020-09-20: 6.25 mg via INTRAVENOUS

## 2020-09-20 MED ORDER — FENTANYL CITRATE (PF) 100 MCG/2ML IJ SOLN
INTRAMUSCULAR | Status: DC | PRN
Start: 2020-09-20 — End: 2020-09-20
  Administered 2020-09-20: 100 ug via EPIDURAL
  Administered 2020-09-20: 50 ug via EPIDURAL

## 2020-09-20 MED ORDER — ONDANSETRON HCL 4 MG/2ML IJ SOLN
INTRAMUSCULAR | Status: DC | PRN
  Administered 2020-09-20: 4 mg via INTRAVENOUS

## 2020-09-20 MED ORDER — EPHEDRINE SULFATE-NACL 50-0.9 MG/10ML-% IV SOSY
PREFILLED_SYRINGE | INTRAVENOUS | Status: DC | PRN
  Administered 2020-09-20 (×2): 10 mg via INTRAVENOUS

## 2020-09-20 MED ORDER — MORPHINE SULFATE (PF) 0.5 MG/ML IJ SOLN
INTRAMUSCULAR | Status: AC
  Filled 2020-09-20: qty 10

## 2020-09-20 MED ORDER — FLEET ENEMA 7-19 GM/118ML RE ENEM: 1.0000 | ENEMA | Freq: Every day | RECTAL | Status: DC | PRN

## 2020-09-20 MED ORDER — MEASLES, MUMPS & RUBELLA VAC IJ SOLR
0.5000 mL | Freq: Once | INTRAMUSCULAR | Status: DC
  Filled 2020-09-20: qty 0.5

## 2020-09-20 MED ORDER — BUPIVACAINE HCL (PF) 0.5 % IJ SOLN
INTRAMUSCULAR | Status: AC
  Filled 2020-09-20: qty 30

## 2020-09-20 MED ORDER — ONDANSETRON HCL 4 MG/2ML IJ SOLN: 4.0000 mg | Freq: Three times a day (TID) | INTRAMUSCULAR | Status: DC | PRN

## 2020-09-20 MED ORDER — CEFAZOLIN SODIUM-DEXTROSE 2-3 GM-%(50ML) IV SOLR
INTRAVENOUS | Status: DC | PRN
  Administered 2020-09-20: 2 g via INTRAVENOUS

## 2020-09-20 SURGICAL SUPPLY — 32 items
BARRIER ADHS 3X4 INTERCEED (GAUZE/BANDAGES/DRESSINGS) ×3 IMPLANT
CANISTER SUCT 3000ML PPV (MISCELLANEOUS) ×3 IMPLANT
CELL SAVER LIPIGURD (MISCELLANEOUS) ×1 IMPLANT
CHLORAPREP W/TINT 26 (MISCELLANEOUS) ×3 IMPLANT
COVER WAND RF STERILE (DRAPES) ×3 IMPLANT
DRESSING TELFA 8X3 (GAUZE/BANDAGES/DRESSINGS) ×2 IMPLANT
DRSG TELFA 3X8 NADH (GAUZE/BANDAGES/DRESSINGS) ×3 IMPLANT
ELECT REM PT RETURN 9FT ADLT (ELECTROSURGICAL) ×3
ELECTRODE REM PT RTRN 9FT ADLT (ELECTROSURGICAL) ×1 IMPLANT
EXTRACTOR VACUUM KIWI (MISCELLANEOUS) ×3 IMPLANT
EXTRT SYSTEM ALEXIS 14CM (MISCELLANEOUS) ×3
GAUZE CURAFIL 4X4 (GAUZE/BANDAGES/DRESSINGS) IMPLANT
GAUZE SPONGE 4X4 12PLY STRL (GAUZE/BANDAGES/DRESSINGS) ×3 IMPLANT
GOWN STRL REUS W/ TWL LRG LVL3 (GOWN DISPOSABLE) ×3 IMPLANT
GOWN STRL REUS W/TWL LRG LVL3 (GOWN DISPOSABLE) ×6
NEEDLE HYPO 25GX1X1/2 BEV (NEEDLE) ×3 IMPLANT
NS IRRIG 1000ML POUR BTL (IV SOLUTION) ×3 IMPLANT
PACK C SECTION AR (MISCELLANEOUS) ×3 IMPLANT
PAD OB MATERNITY 4.3X12.25 (PERSONAL CARE ITEMS) ×3 IMPLANT
PAD PREP 24X41 OB/GYN DISP (PERSONAL CARE ITEMS) ×3 IMPLANT
PENCIL SMOKE ULTRAEVAC 22 CON (MISCELLANEOUS) ×3 IMPLANT
SUT MNCRL 4-0 (SUTURE) ×2
SUT MNCRL 4-0 27XMFL (SUTURE) ×1
SUT PLAIN GUT 0 (SUTURE) ×3 IMPLANT
SUT VIC AB 0 CT1 36 (SUTURE) ×6 IMPLANT
SUT VIC AB 0 CTX 36 (SUTURE) ×4
SUT VIC AB 0 CTX36XBRD ANBCTRL (SUTURE) ×2 IMPLANT
SUT VIC AB 2-0 SH 27 (SUTURE) ×4
SUT VIC AB 2-0 SH 27XBRD (SUTURE) ×2 IMPLANT
SUT VICRYL 3-0 27IN SH (SUTURE) ×3 IMPLANT
SUTURE MNCRL 4-0 27XMF (SUTURE) ×1 IMPLANT
SYR 30ML LL (SYRINGE) ×6 IMPLANT

## 2020-09-20 NOTE — Transfer of Care (Signed)
Immediate Anesthesia Transfer of Care Note  Patient: Angela Bryan  Procedure(s) Performed: CESAREAN SECTION  Patient Location: PACU and Mother/Baby  Anesthesia Type:Epidural  Level of Consciousness: awake  Airway & Oxygen Therapy: Patient Spontanous Breathing  Post-op Assessment: Report given to RN and Post -op Vital signs reviewed and stable  Post vital signs: Reviewed and stable  Last Vitals:  Vitals Value Taken Time  BP    Temp    Pulse 82 09/20/20 1254  Resp 20 09/20/20 1254  SpO2 97 % 09/20/20 1254  Vitals shown include unvalidated device data.  Last Pain:  Vitals:   09/20/20 1005  TempSrc: Axillary  PainSc:          Complications: No complications documented.

## 2020-09-20 NOTE — Progress Notes (Signed)
28yo G1P0 at [redacted]w[redacted]d by LMP admitted for induction of labor due to preeclampsia without severe features and A1GDM.  She has been 9 since 0615 and 8.5 at 0215. Caput and molding increased without descent.   The risks of cesarean section discussed with the patient included but were not limited to: bleeding which may require transfusion or reoperation; infection which may require antibiotics; injury to bowel, bladder, ureters or other surrounding organs; injury to the fetus; need for additional procedures including hysterectomy in the event of a life-threatening hemorrhage; placental abnormalities wth subsequent pregnancies, incisional problems, thromboembolic phenomenon and other postoperative/anesthesia complications. The patient concurred with the proposed plan, giving informed written consent for the procedure.  Anesthesia and OR aware. Preoperative prophylactic antibiotics and SCDs ordered on call to the OR.  To OR when ready.

## 2020-09-20 NOTE — Discharge Instructions (Signed)
Cesarean Delivery, Care After This sheet gives you information about how to care for yourself after your procedure. Your health care provider may also give you more specific instructions. If you have problems or questions, contact your health care provider. What can I expect after the procedure? After the procedure, it is common to have:  A small amount of blood or clear fluid coming from the incision.  Some redness, swelling, and pain in your incision area.  Some abdominal pain and soreness.  Vaginal bleeding (lochia). Even though you did not have a vaginal delivery, you will still have vaginal bleeding and discharge.  Pelvic cramps.  Fatigue. You may have pain, swelling, and discomfort in the tissue between your vagina and your anus (perineum) if:  Your C-section was unplanned, and you were allowed to labor and push.  An incision was made in the area (episiotomy) or the tissue tore during attempted vaginal delivery. Follow these instructions at home: Incision care   Follow instructions from your health care provider about how to take care of your incision. Make sure you: ? Wash your hands with soap and water before you change your bandage (dressing). If soap and water are not available, use hand sanitizer. ? If you have a dressing, change it or remove it as told by your health care provider. ? Leave stitches (sutures), skin staples, skin glue, or adhesive strips in place. These skin closures may need to stay in place for 2 weeks or longer. If adhesive strip edges start to loosen and curl up, you may trim the loose edges. Do not remove adhesive strips completely unless your health care provider tells you to do that.  Check your incision area every day for signs of infection. Check for: ? More redness, swelling, or pain. ? More fluid or blood. ? Warmth. ? Pus or a bad smell.  Do not take baths, swim, or use a hot tub until your health care provider says it's okay. Ask your health  care provider if you can take showers.  When you cough or sneeze, hug a pillow. This helps with pain and decreases the chance of your incision opening up (dehiscing). Do this until your incision heals. Medicines  Take over-the-counter and prescription medicines only as told by your health care provider.  If you were prescribed an antibiotic medicine, take it as told by your health care provider. Do not stop taking the antibiotic even if you start to feel better.  Do not drive or use heavy machinery while taking prescription pain medicine. Lifestyle  Do not drink alcohol. This is especially important if you are breastfeeding or taking pain medicine.  Do not use any products that contain nicotine or tobacco, such as cigarettes, e-cigarettes, and chewing tobacco. If you need help quitting, ask your health care provider. Eating and drinking  Drink at least 8 eight-ounce glasses of water every day unless told not to by your health care provider. If you breastfeed, you may need to drink even more water.  Eat high-fiber foods every day. These foods may help prevent or relieve constipation. High-fiber foods include: ? Whole grain cereals and breads. ? Brown rice. ? Beans. ? Fresh fruits and vegetables. Activity   If possible, have someone help you care for your baby and help with household activities for at least a few days after you leave the hospital.  Return to your normal activities as told by your health care provider. Ask your health care provider what activities are safe for   you.  Rest as much as possible. Try to rest or take a nap while your baby is sleeping.  Do not lift anything that is heavier than 10 lbs (4.5 kg), or the limit that you were told, until your health care provider says that it is safe.  Talk with your health care provider about when you can engage in sexual activity. This may depend on your: ? Risk of infection. ? How fast you heal. ? Comfort and desire to  engage in sexual activity. General instructions  Do not use tampons or douches until your health care provider approves.  Wear loose, comfortable clothing and a supportive and well-fitting bra.  Keep your perineum clean and dry. Wipe from front to back when you use the toilet.  If you pass a blood clot, save it and call your health care provider to discuss. Do not flush blood clots down the toilet before you get instructions from your health care provider.  Keep all follow-up visits for you and your baby as told by your health care provider. This is important. Contact a health care provider if:  You have: ? A fever. ? Bad-smelling vaginal discharge. ? Pus or a bad smell coming from your incision. ? Difficulty or pain when urinating. ? A sudden increase or decrease in the frequency of your bowel movements. ? More redness, swelling, or pain around your incision. ? More fluid or blood coming from your incision. ? A rash. ? Nausea. ? Little or no interest in activities you used to enjoy. ? Questions about caring for yourself or your baby.  Your incision feels warm to the touch.  Your breasts turn red or become painful or hard.  You feel unusually sad or worried.  You vomit.  You pass a blood clot from your vagina.  You urinate more than usual.  You are dizzy or light-headed. Get help right away if:  You have: ? Pain that does not go away or get better with medicine. ? Chest pain. ? Difficulty breathing. ? Blurred vision or spots in your vision. ? Thoughts about hurting yourself or your baby. ? New pain in your abdomen or in one of your legs. ? A severe headache.  You faint.  You bleed from your vagina so much that you fill more than one sanitary pad in one hour. Bleeding should not be heavier than your heaviest period. Summary  After the procedure, it is common to have pain at your incision site, abdominal cramping, and slight bleeding from your vagina.  Check  your incision area every day for signs of infection.  Tell your health care provider about any unusual symptoms.  Keep all follow-up visits for you and your baby as told by your health care provider. This information is not intended to replace advice given to you by your health care provider. Make sure you discuss any questions you have with your health care provider. Document Revised: 06/20/2018 Document Reviewed: 06/20/2018 Elsevier Patient Education  2020 [employer] Inc[employer]  

## 2020-09-20 NOTE — Progress Notes (Signed)
Labor Progress Note  Angela Bryan is a 29 y.o. G1P0 at [redacted]w[redacted]d by LMP admitted for induction of labor due to preeclampsia w/o severe features and A1GDM.  Subjective: reports contractions with increasing strength, recently received epidural   Objective: BP 125/71   Pulse 90   Temp 98.1 F (36.7 C) (Oral)   Resp 16   Ht 5\' 2"  (1.575 m)   Wt 85.3 kg   LMP 01/01/2020 (Approximate)   SpO2 100%   BMI 34.39 kg/m  Notable VS details: reviewed, no severe range b/p's   Fetal Assessment: FHT:  FHR: 125 bpm, variability: moderate,  accelerations:  Present,  decelerations:  Present intermittent variables, prolong decel following epidural placement Category/reactivity:  Category II UC:   regular, every 2 minutes SVE:  4/80/+2 by RN Membrane status: SROM at 2130 Amniotic color: clear   Labs: Lab Results  Component Value Date   WBC 9.2 09/19/2020   HGB 11.9 (L) 09/19/2020   HCT 34.5 (L) 09/19/2020   MCV 81.4 09/19/2020   PLT 221 09/19/2020    Assessment / Plan: IOL d/t preeclampsia and A1GDM, s/p 2 doses of vaginal and buccal misoprostol  Labor: Progressing normally.  Will hold oxytocin for now due to progression on misoprostol and frequent contractions.  Will reassess in 2-4 hours.   Preeclampsia:  no signs or symptoms of toxicity Fetal Wellbeing:  Category II - prolong decel following epidural and descent from -2 to +2.  Reassuring with return to baseline and moderate variability. Pain Control:  Epidural I/D:  s/p SROM for 3 hours, afebrile   09/21/2020, CNM 09/20/2020, 12:17 AM

## 2020-09-20 NOTE — Progress Notes (Signed)
Labor Progress Note  Angela Bryan is a 29 y.o. G1P0 at [redacted]w[redacted]d by LMP admitted for induction of labor due to preeclampsia w/o severe features and A1GDM.  Called to labor room for FHR decelerations.    Subjective: comfortable after epidural  Objective: BP 123/85   Pulse 81   Temp 98.8 F (37.1 C) (Axillary)   Resp 16   Ht 5\' 2"  (1.575 m)   Wt 85.3 kg   LMP 01/01/2020 (Approximate)   SpO2 99%   BMI 34.39 kg/m  Notable VS details: reviewed, no severe range blood pressures   Fetal Assessment: FHT:  FHR: 160 bpm, variability: moderate,  accelerations:  Present,  decelerations:  Present recurrent late decelerations that resolved after interventions, FSE placed  Category/reactivity:  Category II UC:   regular, every 2 minutes, IUPC placed  SVE:   8/90/+2 Membrane status: SROM at 2130 Amniotic color: clear   Labs: Lab Results  Component Value Date   WBC 9.2 09/19/2020   HGB 11.9 (L) 09/19/2020   HCT 34.5 (L) 09/19/2020   MCV 81.4 09/19/2020   PLT 221 09/19/2020    Assessment / Plan: IOL for preeclampsia and A1GDM, progressing well after 2 doses of vaginal and buccal misoprostol. Dr. 09/21/2020 updated on fetal tracing.    Labor: Progressing normally.   Preeclampsia:  no signs or symptoms of toxicity Fetal Wellbeing:  Category II.  IUPC and FSE placed for better assessment of contractions in relation to FHR.  Terb given for intrauterine resuscitation.  Recurrent late decelerations resolved after interventions.  Pain Control:  Epidural I/D:  SROM x 6 hours, intermittent maternal tachycardia that was present prior to SROM.  Currently afebrile.    Dalbert Garnet, CNM 09/20/2020, 3:28 AM

## 2020-09-20 NOTE — Progress Notes (Deleted)
   09/20/20 0700  Clinical Encounter Type  Visited With Family;Health care provider  Visit Type Initial;Other (Comment)  Referral From Nurse [facility] )   Urgent page received to support the patient's significant other as she underwent a cesarean section. Upon arrival, the patient's significant other was waiting in the hallway to enter the OR to support the patient. He shared that he was excited, nervous, and concerned for them both during this birthing process. This is his first baby and he is proud that they are welcoming a baby boy. He also shared that they have significant social and familial support. This chaplain provided compassionate ministerial support, active and reflective listening, and encouragement. After the patient's significant other went into the OR, this chaplain maintained pastoral presence outside until I heard the cries of their newborn baby boy and the celebration of medical staff welcoming his safe entry into the world.   Clovis Riley, Chaplain

## 2020-09-20 NOTE — Discharge Summary (Signed)
Obstetrical Discharge Summary  Patient Name: Angela Bryan DOB: 09/22/1991 MRN: [id number]  Date of Admission: 09/19/2020 Date of Discharge: 09/22/2020  Primary OB: [facility]  Gestational Age at Delivery: [redacted]w[redacted]d   Antepartum complications:   Pregnancy Issues: 1. Preeclampsia without severe features  2. A1GDM - insulin ordered but never started  3. Prepregnancy BMI >30 4. Agoraphobia and anxiety - has not been able to attend in person clinic visits since 28 weeks.  Started on Pristiq and vistaril.  5. Resolved placenta previa - resolved at 28 weeks  6. History of asthma   Admitting Diagnosis: IOL, Pre-eclampsia Secondary Diagnosis: Low transverse CS, arrest of descent.   Patient Active Problem List   Diagnosis Date Noted  . Pre-eclampsia during pregnancy in third trimester, antepartum 09/19/2020  . Gestational diabetes, diet controlled 09/19/2020  . Supervision of high risk pregnancy, antepartum, third trimester 02/03/2020  . GERD (gastroesophageal reflux disease) 05/14/2018  . Generalized anxiety disorder 04/05/2018    Augmentation: Pitocin and Cytotec Complications: Hemorrhage>1075mL Intrapartum complications/course:   Arrest of descent at +2,see op note  Date of Delivery: 09/20/20 Delivered By: Christeen Douglas Delivery Type: primary cesarean section, low transverse incision Anesthesia: epidural Placenta: Extracted Newborn Data: Live born female "Sherilyn Cooter" Birth Weight: 7 lb 3.3 oz (3270 g) APGAR: 8, 9  Newborn Delivery   Birth date/time: 09/20/2020 11:43:00 Delivery type: C-Section, Low Transverse Trial of labor: Yes C-section categorization: Primary       Brief Hospital Course  (Cesarean Section): Doneshia Dollye Glasser is a G1P1001 who underwent cesarean section on 09/19/2020 - 09/20/2020.  Patient had an uncomplicated surgery; for further details of this surgery, please refer to the operative note.  Patient had an uncomplicated postpartum  course.  By time of discharge on POD#2, her pain was controlled on oral pain medications; she had appropriate lochia and was ambulating, voiding without difficulty, tolerating regular diet and passing flatus.   She was deemed stable for discharge to home.     Discharge Physical Exam:  BP 124/72 (BP Location: Right Arm)   Pulse 82   Temp 98.5 F (36.9 C) (Oral)   Resp 18   Ht 5\' 2"  (1.575 m)   Wt 85.3 kg   LMP 01/01/2020 (Approximate)   SpO2 97%   Breastfeeding Unknown   BMI 34.39 kg/m   General: NAD CV: RRR Pulm: CTABL, nl effort ABD: s/nd/nt, fundus firm and below the umbilicus Lochia: moderate Incision: c/d/i  DVT Evaluation: LE non-ttp, no evidence of DVT on exam.  Hemoglobin  Date Value Ref Range Status  09/22/2020 8.3 (L) 12.0 - 15.0 g/dL Final   HCT  Date Value Ref Range Status  09/22/2020 24.0 (L) 36 - 46 % Final    Post partum course: WNL  Postpartum Procedures: none  Edinburgh:  Edinburgh Postnatal Depression Scale Screening Tool 09/22/2020  I have been able to laugh and see the funny side of things. 0  I have looked forward with enjoyment to things. 2  I have blamed myself unnecessarily when things went wrong. 1  I have been anxious or worried for no good reason. 2  I have felt scared or panicky for no good reason. 2  Things have been getting on top of me. 1  I have been so unhappy that I have had difficulty sleeping. 0  I have felt sad or miserable. 1  I have been so unhappy that I have been crying. 1  The thought of harming myself has occurred to me.  0  Edinburgh Postnatal Depression Scale Total 10    Disposition: stable, discharge to home. Baby Feeding: formula Baby Disposition: home with mom  Rh Immune globulin given: n/a Rubella vaccine given: n/a  Flu vaccine given in AP or PP setting: declined Tdap vaccine given in AP or PP setting: declined   Contraception: Undecided  Prenatal Labs: Blood type/Rh --/--/A POS Performed at [facility], [address] [place], [place] [address]  [phone])  Antibody screen neg  Rubella Immune  Varicella Immune  RPR NR  HBsAg Neg  HIV NR  GC neg  Chlamydia neg  Genetic screening negative  1 hour GTT 210  3 hour GTT n/a  GBS positive     Plan:  Evamarie Danielle Christopoulos was discharged to home in good condition. Follow-up appointment at [facility] liver[facility] provider in 2 weeks   Discharge Medications: Allergies as of 09/22/2020      Reactions   Green Coffee Bean-yerba Mate Anaphylaxis   Has epi pen   Latex Rash      Medication List    TAKE these medications   acetaminophen 500 MG tablet Commonly known as: TYLENOL Take 2 tablets (1,000 mg total) by mouth every 6 (six) hours. What changed:   how much to take  when to take this  reasons to take this   desvenlafaxine 100 MG 24 hr tablet Commonly known as: PRISTIQ Take 100 mg by mouth daily.   ferrous sulfate 325 (65 FE) MG tablet Take 1 tablet (325 mg total) by mouth 2 (two) times daily with a meal.   fluticasone 50 MCG/ACT nasal spray Commonly known as: FLONASE Place 2 sprays into both nostrils daily.   hydrOXYzine 10 MG tablet Commonly known as: ATARAX/VISTARIL Take 10 mg by mouth every 4 (four) hours as needed for anxiety (take 1-2 tablets as needed for anxiety).   ibuprofen 800 MG tablet Commonly known as: ADVIL Take 1 tablet (800 mg total) by mouth every 6 (six) hours.   omeprazole 10 MG capsule Commonly known as: PRILOSEC Take 10 mg by mouth daily.   oxyCODONE 5 MG immediate release tablet Commonly known as: Oxy IR/ROXICODONE Take 1 tablet (5 mg total) by mouth every 6 (six) hours as needed for up to 7 days for moderate pain or severe pain.   senna-docusate 8.6-50 MG tablet Commonly known as: Senokot-S Take 2 tablets by mouth at bedtime.        Follow-up Information    Christeen Douglas, MD In 2 weeks.   Specialty: Obstetrics and Gynecology Why: For postop  check Contact information: [address] [place] [address] [phone]               Signed: Randa Ngo, CNM 09/22/2020 6:11 PM

## 2020-09-20 NOTE — Anesthesia Preprocedure Evaluation (Signed)
Anesthesia Evaluation  Patient identified by MRN, date of birth, ID band Patient awake    Reviewed: Allergy & Precautions, H&P , NPO status , Patient's Chart, lab work & pertinent test results, reviewed documented beta blocker date and time   History of Anesthesia Complications (+) PONV and history of anesthetic complications  Airway Mallampati: III  TM Distance: >3 FB Neck ROM: full    Dental no notable dental hx.    Pulmonary neg shortness of breath, asthma , neg recent URI,    Pulmonary exam normal breath sounds clear to auscultation       Cardiovascular Exercise Tolerance: Good hypertension (gestational), (-) anginaNormal cardiovascular exam(-) dysrhythmias (-) Valvular Problems/Murmurs Rhythm:regular Rate:Normal     Neuro/Psych  Headaches, neg Seizures PSYCHIATRIC DISORDERS Anxiety Depression    GI/Hepatic Neg liver ROS, GERD  ,  Endo/Other  diabetes, Well Controlled, Gestational  Renal/GU negative Renal ROS  negative genitourinary   Musculoskeletal   Abdominal   Peds  Hematology negative hematology ROS (+)   Anesthesia Other Findings Past Medical History: No date: Allergy No date: Anxiety No date: Asthma No date: Depression No date: Diabetes mellitus without complication (HCC) No date: Headache No date: Mental disorder 02/06/2017: Pelvic inflammatory disease No date: PONV (postoperative nausea and vomiting)   Reproductive/Obstetrics (+) Pregnancy                             Anesthesia Physical Anesthesia Plan  ASA: III  Anesthesia Plan: Epidural   Post-op Pain Management:    Induction:   PONV Risk Score and Plan:   Airway Management Planned:   Additional Equipment:   Intra-op Plan:   Post-operative Plan:   Informed Consent: I have reviewed the patients History and Physical, chart, labs and discussed the procedure including the risks, benefits and alternatives  for the proposed anesthesia with the patient or authorized representative who has indicated his/her understanding and acceptance.     Dental Advisory Given  Plan Discussed with: Anesthesiologist, CRNA and Surgeon  Anesthesia Plan Comments:         Anesthesia Quick Evaluation

## 2020-09-20 NOTE — Anesthesia Procedure Notes (Signed)
Date/Time: 09/20/2020 11:20 AM Performed by: Elmarie Mainland, CRNA Pre-anesthesia Checklist: Patient identified, Emergency Drugs available, Suction available and Patient being monitored Oxygen Delivery Method: Nasal cannula

## 2020-09-20 NOTE — Progress Notes (Signed)
Labor Progress Note  Angela Bryan is a 29 y.o. G1P0 at [redacted]w[redacted]d by LMP admitted for induction of labor due to preeclampsia without severe features and A1GDM.  Subjective: feeling intermittent pressure   Objective: BP (!) 100/57   Pulse 93   Temp 99 F (37.2 C) (Oral)   Resp 15   Ht 5\' 2"  (1.575 m)   Wt 85.3 kg   LMP 01/01/2020 (Approximate)   SpO2 96%   BMI 34.39 kg/m  Notable VS details: reviewed, no severe range b/p's   Fetal Assessment: FHT:  FHR: 140 bpm, variability: moderate,  accelerations:  Present,  decelerations:  Present intermittent lates and variables  Category/reactivity:  Category II UC:   regular, every 2-3 minutes SVE:   9/C/+2 - molding and caput present  Membrane status: SROM at 2130 Amniotic color: Clear   Labs: Lab Results  Component Value Date   WBC 9.2 09/19/2020   HGB 11.9 (L) 09/19/2020   HCT 34.5 (L) 09/19/2020   MCV 81.4 09/19/2020   PLT 221 09/19/2020    Assessment / Plan: Arrest in active phase of labor  Labor: Unchanged after 4 hours, minimal change from 8 cm in last 8 hours. Discussed continued trial of labor versus primary c/section.  Angela Bryan and family ok to proceed with primary c/section for arrest of labor.  Dr. 09/21/2020 notified. Preeclampsia:  no signs or symptoms of toxicity Fetal Wellbeing:  Category II Pain Control:  Epidural I/D:  SROM x 12 hours, afebrile    Bernestine Amass, CNM 09/20/2020, 10:12 AM

## 2020-09-20 NOTE — Progress Notes (Signed)
Labor Progress Note  Angela Bryan is a 29 y.o. G1P0 at [redacted]w[redacted]d by LMP admitted for induction of labor due to preeclampsia without severe features and A1GDM.  Subjective: comfortable with contractions, denies feeling pressure   Objective: BP 116/71   Pulse 97   Temp 99.1 F (37.3 C) (Oral)   Resp 16   Ht 5\' 2"  (1.575 m)   Wt 85.3 kg   LMP 01/01/2020 (Approximate)   SpO2 100%   BMI 34.39 kg/m  Notable VS details: reviewed, no severe range b/p's   Fetal Assessment: FHT:  FHR: 160 bpm, variability: moderate,  accelerations:  Abscent,  decelerations:  Present intermittent lates and variables  Category/reactivity:  Category II, FSE replaced  UC:   regular, every 2-4 minutes, IUPC in place, MVU 175 SVE:   9/C/+2, with molding and caput  Membrane status: SROM at 2130 Amniotic color: Clear   Labs: Lab Results  Component Value Date   WBC 9.2 09/19/2020   HGB 11.9 (L) 09/19/2020   HCT 34.5 (L) 09/19/2020   MCV 81.4 09/19/2020   PLT 221 09/19/2020    Assessment / Plan: Induction of labor d/t preeclampsia and A1GDM. S/P 2 doses of vaginal and buccal misoprostol.  Reviewed fetal tracing and labor progress with Angela Bryan and family.  Discussed potential need for cesarean birth if FHR does not respond to interventions or no progression in labor.  Questions answered to patient and family's satisfaction.  Desires to continue with trial of labor but ok with cesarean birth if needed.  Dr. 09/21/2020 updated on FHR tracing and plan.  Will continue to monitor closely.    Labor: Progressing normally Preeclampsia:  no signs or symptoms of toxicity Fetal Wellbeing:  Category II.  Intermittent lates and variables that respond to repositioning.  FSE replaced. Transient baseline to 175bpm that has now settled back to 155bpm.   Pain Control:  Epidural I/D:  SROM x 9 hours, afebrile, T-max 99.4  Angela Bryan, Angela Bryan 09/20/2020, 6:27 AM

## 2020-09-20 NOTE — Op Note (Addendum)
  Cesarean Section Procedure Note  Date of procedure: 09/20/2020   Pre-operative Diagnosis: Intrauterine pregnancy at [redacted]w[redacted]d; arrest of descent  Post-operative Diagnosis: same, delivered.  Procedure: Low Transverse Cesarean Section through Pfannenstiel incision  Surgeon: Christeen Douglas, MD  Assistant(s):  Margaretmary Eddy, CNM   Anesthesia: Spinal anesthesia  Anesthesiologist: Karleen Hampshire, MD Anesthesiologist: Lenard Simmer, MD; Karleen Hampshire, MD CRNA: Elmarie Mainland, CRNA  Estimated Blood Loss:  1400         Drains: foley         Total IV Fluids:  Urine Output:         Specimens: none         Complications:  Uterine atony and intrapartum hemorrhage         Disposition: PACU - hemodynamically stable.         Condition: stable  Findings:  A female infant "Sherilyn Cooter" in cephalic presentation. Significant caput  Amniotic fluid - Clear  Birth weight: 3270 g.  Apgars of 8 and 9 at one and five minutes respectively.  Intact placenta with a three-vessel cord.  Grossly normal uterus, tubes and ovaries bilaterally. No  intraabdominal adhesions were noted. Tight rectus muscles, difficult to see operative field all at the same time.  Bleeding at the left fascial angle was controlled with Bovie and 3-0 vicryl.  Indications: failure to progress: arrest of descent and failure to progress: arrest of dilation  Procedure Details  The patient was taken to Operating Room, identified as the correct patient and the procedure verified as C-Section Delivery. A formal Time Out was held with all team members present and in agreement.  After induction of anesthesia, the patient was draped and prepped in the usual sterile manner. A Pfannenstiel skin incision was made and carried down through the subcutaneous tissue to the fascia. Fascial incision was made and extended transversely with the Mayo scissors. The fascia was separated from the underlying rectus tissue superiorly and  inferiorly. The peritoneum was identified and entered bluntly. Peritoneal incision was extended longitudinally. The utero-vesical peritoneal reflection was incised transversely and a bladder flap was created digitally.   A low transverse hysterotomy was made. The fetus was delivered atraumatically. The umbilical cord was clamped x2 and cut and the infant was handed to the awaiting pediatricians. The placenta was removed intact and appeared normal, intact, and with a 3-vessel cord.   The uterus was exteriorized and cleared of all clot and debris. The hysterotomy was closed with running sutures of 0-Vicryl. A second imbricating layer was placed with the same suture. Excellent hemostasis was observed at hte imbricated line, but the uterus remained significantly boggy. IM hemabate directly into the uterus was given.   The peritoneal cavity was cleared of all clots and debris. The uterus was returned to the abdomen.   The pelvis was irrigated and again, excellent hemostasis was noted. The fascia was then reapproximated with running sutures of 0 Vicryl. The subcutaneous tissue was reapproximated with running sutures of 0 Vicry. The skin was reapproximated with Ensorb. 38ml (in 30 of 0.5% bupivicaine and 32ml of NSS) of liposomal bupivicaine placed in the fascial and skin lines.  Instrument, sponge, and needle counts were correct prior to the abdominal closure and at the conclusion of the case.   The patient tolerated the procedure well and was transferred to the recovery room in stable condition.   Christeen Douglas, MD 09/20/2020

## 2020-09-21 ENCOUNTER — Encounter: Payer: Self-pay | Admitting: Obstetrics and Gynecology

## 2020-09-21 LAB — CBC
HCT: 25.4 % — ABNORMAL LOW (ref 36.0–46.0)
Hemoglobin: 8.5 g/dL — ABNORMAL LOW (ref 12.0–15.0)
MCH: 27.7 pg (ref 26.0–34.0)
MCHC: 33.5 g/dL (ref 30.0–36.0)
MCV: 82.7 fL (ref 80.0–100.0)
Platelets: 161 10*3/uL (ref 150–400)
RBC: 3.07 MIL/uL — ABNORMAL LOW (ref 3.87–5.11)
RDW: 15.1 % (ref 11.5–15.5)
WBC: 12 10*3/uL — ABNORMAL HIGH (ref 4.0–10.5)
nRBC: 0.3 % — ABNORMAL HIGH (ref 0.0–0.2)

## 2020-09-21 LAB — COMPREHENSIVE METABOLIC PANEL
ALT: 10 U/L (ref 0–44)
AST: 27 U/L (ref 15–41)
Albumin: 2.1 g/dL — ABNORMAL LOW (ref 3.5–5.0)
Alkaline Phosphatase: 76 U/L (ref 38–126)
Anion gap: 8 (ref 5–15)
BUN: 6 mg/dL (ref 6–20)
CO2: 24 mmol/L (ref 22–32)
Calcium: 8.2 mg/dL — ABNORMAL LOW (ref 8.9–10.3)
Chloride: 106 mmol/L (ref 98–111)
Creatinine, Ser: 0.66 mg/dL (ref 0.44–1.00)
GFR calc Af Amer: >60 mL/min (ref >60)
GFR calc non Af Amer: >60 mL/min (ref >60)
Glucose, Bld: 96 mg/dL (ref 70–99)
Potassium: 3.3 mmol/L — ABNORMAL LOW (ref 3.5–5.1)
Sodium: 138 mmol/L (ref 135–145)
Total Bilirubin: 0.5 mg/dL (ref 0.3–1.2)
Total Protein: 4.9 g/dL — ABNORMAL LOW (ref 6.5–8.1)

## 2020-09-21 MED ORDER — DESVENLAFAXINE SUCCINATE ER 100 MG PO TB24
100.0000 mg | ORAL_TABLET | Freq: Every day | ORAL | Status: DC
  Administered 2020-09-21 – 2020-09-22 (×2): 100 mg via ORAL
  Filled 2020-09-21: qty 1

## 2020-09-21 NOTE — Progress Notes (Signed)
Post Op Day 1  Subjective: Doing well, no concerns. Ambulating without difficulty, pain managed with PO meds, and tolerating regular diet. Foley catheter removed, has yet to void.   No fever/chills, chest pain, shortness of breath, nausea/vomiting, or leg pain. No nipple or breast pain.   Objective: BP 127/86 (BP Location: Right Arm)   Pulse 83   Temp 98.1 F (36.7 C) (Oral)   Resp 18   Ht 5\' 2"  (1.575 m)   Wt 85.3 kg   LMP 01/01/2020 (Approximate)   SpO2 98%   Breastfeeding Unknown   BMI 34.39 kg/m    Physical Exam:  General: alert, cooperative, appears stated age and no distress Breasts: soft/nontender CV: RRR Pulm: nl effort, CTABL Abdomen: soft, non-tender, active bowel sounds Uterine Fundus: firm Incision: healing well, no significant drainage, no significant erythema Lochia: appropriate DVT Evaluation: No evidence of DVT seen on physical exam. No cords or calf tenderness. No significant calf/ankle edema.  Recent Labs    09/20/20 1843 09/21/20 0546  HGB 8.1* 8.5*  HCT 24.5* 25.4*  WBC 12.6* 12.0*  PLT 162 161    Assessment/Plan: 28 y.o. G1P1001 postop day # 1  -Continue routine postpartum care -Encouraged snug fitting bra, cold application, Tylenol PRN, and cabbage leaves for engorgement for formula feeding  -Acute blood loss anemia - hemodynamically stable and asymptomatic; continue PO ferrous sulfate BID with stool softeners  -Immunization status: all immunizations up to date  Disposition: Continue inpatient postpartum care    LOS: 2 days   09/23/20, CNM 09/21/2020, 7:42 AM   ----- 09/23/2020 Certified Nurse Midwife [facility]

## 2020-09-21 NOTE — Anesthesia Post-op Follow-up Note (Signed)
  Anesthesia Pain Follow-up Note  Patient: Angela Bryan  Day #: 1  Date of Follow-up: 09/21/2020 Time: 7:31 AM  Last Vitals:  Vitals:   09/20/20 2320 09/21/20 0242  BP: 122/74 127/86  Pulse: 85 83  Resp: 20 18  Temp: 37.1 C 36.7 C  SpO2: 99% 98%    Level of Consciousness: lethargic  Pain: mild   Side Effects:None  Catheter Site Exam:clean, dry, no drainage     Plan: D/C from anesthesia care at surgeon's request  Lynden Oxford

## 2020-09-21 NOTE — Lactation Note (Signed)
This note was copied from a baby's chart. Lactation Consultation Note  Patient Name: Angela Bryan Today's Date: 09/21/2020   Mother has chosen to formula feed only. LC reviewed how to dry up her milk and was given formula preparation sheet. Mother has no questions at this time. LC # on board.       [name] D [name] 09/21/2020, 4:01 PM

## 2020-09-21 NOTE — Anesthesia Postprocedure Evaluation (Signed)
Anesthesia Post Note  Patient: Angela Bryan  Procedure(s) Performed: CESAREAN SECTION  Patient location during evaluation: Mother Baby Anesthesia Type: Epidural Level of consciousness: oriented, lethargic and patient cooperative Pain management: pain level controlled Vital Signs Assessment: post-procedure vital signs reviewed and stable Respiratory status: spontaneous breathing and respiratory function stable Cardiovascular status: blood pressure returned to baseline and stable Postop Assessment: no headache, no backache, no apparent nausea or vomiting and patient able to bend at knees Anesthetic complications: no   No complications documented.   Last Vitals:  Vitals:   09/20/20 2320 09/21/20 0242  BP: 122/74 127/86  Pulse: 85 83  Resp: 20 18  Temp: 37.1 C 36.7 C  SpO2: 99% 98%    Last Pain:  Vitals:   09/21/20 0457  TempSrc:   PainSc: 5                  Lynden Oxford

## 2020-09-22 LAB — CBC
HCT: 24 % — ABNORMAL LOW (ref 36.0–46.0)
Hemoglobin: 8.3 g/dL — ABNORMAL LOW (ref 12.0–15.0)
MCH: 28.7 pg (ref 26.0–34.0)
MCHC: 34.6 g/dL (ref 30.0–36.0)
MCV: 83 fL (ref 80.0–100.0)
Platelets: 177 10*3/uL (ref 150–400)
RBC: 2.89 MIL/uL — ABNORMAL LOW (ref 3.87–5.11)
RDW: 15.6 % — ABNORMAL HIGH (ref 11.5–15.5)
WBC: 11.2 10*3/uL — ABNORMAL HIGH (ref 4.0–10.5)
nRBC: 0 % (ref 0.0–0.2)

## 2020-09-22 MED ORDER — ACETAMINOPHEN 500 MG PO TABS: 1000.0000 mg | ORAL_TABLET | Freq: Four times a day (QID) | ORAL | 0 refills | Status: DC

## 2020-09-22 MED ORDER — SENNOSIDES-DOCUSATE SODIUM 8.6-50 MG PO TABS: 2.0000 | ORAL_TABLET | Freq: Every day | ORAL | 0 refills | Status: DC

## 2020-09-22 MED ORDER — FERROUS SULFATE 325 (65 FE) MG PO TABS: 325.0000 mg | ORAL_TABLET | Freq: Two times a day (BID) | ORAL | 1 refills | Status: DC

## 2020-09-22 MED ORDER — IBUPROFEN 800 MG PO TABS: 800.0000 mg | ORAL_TABLET | Freq: Four times a day (QID) | ORAL | 0 refills | Status: DC

## 2020-09-22 MED ORDER — OXYCODONE HCL 5 MG PO TABS
5.0000 mg | ORAL_TABLET | Freq: Four times a day (QID) | ORAL | 0 refills | Status: AC | PRN
Start: 2020-09-22 — End: 2020-09-29

## 2020-09-22 NOTE — Discharge Summary (Signed)
Pt dc'd from system. Pt will remain in room 339 with infant who is still an SCN pt. Dc and follow up appt reviewed and signed. Pt verbalizes understanding of all dc instructions.

## 2020-09-22 NOTE — Progress Notes (Signed)
Post Partum Day 2 Subjective: Doing well, no complaints.  Tolerating regular diet, pain with PO meds, voiding and ambulating without difficulty.  No CP SOB Fever,Chills, N/V or leg pain; denies nipple or breast pain, no HA change of vision, RUQ/epigastric pain  Objective: BP 124/72 (BP Location: Right Arm)   Pulse 82   Temp 98.5 F (36.9 C) (Oral)   Resp 18   Ht 5\' 2"  (1.575 m)   Wt 85.3 kg   LMP 01/01/2020 (Approximate)   SpO2 97%   Breastfeeding Unknown   BMI 34.39 kg/m    Physical Exam:  General: NAD Breasts: soft/nontender- formula feeding exclusively.  CV: RRR Pulm: nl effort, CTABL Abdomen: soft, NT, BS x 4 Incision: no erythema or drainage, honeycomb dsg with steristrips noted.  Lochia: small Uterine Fundus: fundus firm and 1 fb below umbilicus DVT Evaluation: no cords, ttp LEs   Recent Labs    09/21/20 0546 09/22/20 0539  HGB 8.5* 8.3*  HCT 25.4* 24.0*  WBC 12.0* 11.2*  PLT 161 177    Assessment/Plan: 28 y.o. G1P1001 postpartum day # 2  - Continue routine PP care, NO BP elevation noted, no sx Pre-e.  - Sports bra and cabbage leaves for bottlefeeding.  - Infant to be rooming-in later today per parents - Discussed contraceptive options including implant, IUDs hormonal and non-hormonal, injection, pills/ring/patch, condoms, and NFP.  - Acute blood loss anemia - hemodynamically stable and asymptomatic; continue po ferrous sulfate BID with stool softeners  - Immunization status:  all Imms up to date, declined tdap and flu    Disposition: Does desire Dc home today.     09/24/20, CNM 09/22/2020  11:51 AM

## 2020-09-22 NOTE — Clinical Social Work Maternal (Signed)
CLINICAL SOCIAL WORK MATERNAL/CHILD NOTE  Patient Details  Name: Angela Bryan MRN: [id number] Date of Birth: 04-28-1991  Date:  09/22/2020  Clinical Social Worker Initiating Note:  Angela Bryan Date/Time: Initiated:  09/22/20/      Child's Name:  Angela Bryan   Biological Parents:  Father, Mother   Need for Interpreter:  None   Reason for Referral:   Angela Bryan Score of 10)   Address:  [address] [place] [address]    Phone number:  [phone] (home)     Additional phone number: None  Household Members/Support Persons (HM/SP):   Household Member/Support Person 1   HM/SP Name Relationship DOB or Age  HM/SP -1 Angela Bryan boyfriend/FOB unknown  HM/SP -2        HM/SP -3        HM/SP -4        HM/SP -5        HM/SP -6        HM/SP -7        HM/SP -8          Natural Supports (not living in the home):  Extended Family, Friends, Immediate Family   Professional Supports:     Employment: Animator   Type of Work: Pharmacist, hospital at [employer]   Education:  Forensic psychologist   Homebound arranged:    Museum/gallery curator Resources:  Multimedia programmer   Other Resources:  Physicist, medical , [place] Considerations Which May Impact Care:  None  Strengths:  Ability to meet basic needs , Compliance with medical plan , Pediatrician chosen, Home prepared for child , Understanding of illness   Psychotropic Medications:         Pediatrician:    [place]  Pediatrician List:   Angela Bryan  ([facility])  [place]      Pediatrician Fax Number:    Risk Factors/Current Problems:  None   Cognitive State:  Able to Concentrate , Alert , Goal Oriented    Mood/Affect:  Calm , Happy    CSW Assessment:   CSW received a consult for MOB with Edinburgh Score of 10.  CSW spoke with RN Angela Bryan prior to meeting with MOB. Per RN, MOB is appropriate  and no other concerns at this time.   CSW met with MOB at bedside. Explained CSW's role and reason for referral.  MOB reported she is feeling good post delivery. MOB was alert and appropriate during assessment.   Confirmed contact information for MOB. MOB and Baby will be living with FOB Angela Bryan) at discharge.   MOB reported she receives [facility] and [employer] and will inform her Workers of 74 birth. MOB plans to use [facility] for [facility]. MOB reported she has a crib, pack and play, car seat (used, reports it is unexpired), clothing, diapers, and all other items needed for Baby. MOB reported she has reliable transportation for herself and Baby. MOB denied resource needs at this time.   Discussed MOB's MH history. 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 MOB reported she is actively involved with a Psychiatrist for [facility] medication management and a Therapist for bi-weekly therapy sessions at [facility]. MOB reported she was taking Zoloft during her pregnancy, but just got switched back to Pristiq which she feels works best for her. She was taking this prior  to pregnancy. MOB reported she has a good support system and is coping well emotionally at this time. MOB denied SI, HI, or DV. MOB denied the need for mental health support resources at this time, reported she is aware of resources if needed.  CSW provided education and information sheets on PPD and SIDS. MOB verbalized understanding. CSW ecouraged MOB to reach out to her Provider with any questions or needs for support or resources, even after discharge.   MOB denied any needs or questions at this time. CSW encouraged MOB to reach out if any arise prior to discharge.   Please re consult CSW if any additional needs or concerns arise.  CSW Plan/Description:  Sudden Infant Death Syndrome (SIDS) Education, Perinatal Mood and Anxiety Disorder (PMADs) Education, Other Patient/Family  Education, No Further Intervention Required/No Barriers to Discharge    [place], LCSW 09/22/2020, 2:22 PM

## 2020-11-03 DIAGNOSIS — Z30011 Encounter for initial prescription of contraceptive pills: Secondary | ICD-10-CM | POA: Diagnosis not present

## 2020-11-03 DIAGNOSIS — Z1332 Encounter for screening for maternal depression: Secondary | ICD-10-CM | POA: Diagnosis not present

## 2021-01-01 ENCOUNTER — Other Ambulatory Visit: Payer: Self-pay | Admitting: *Deleted

## 2021-01-01 DIAGNOSIS — R059 Cough, unspecified: Secondary | ICD-10-CM

## 2021-01-01 DIAGNOSIS — J4541 Moderate persistent asthma with (acute) exacerbation: Secondary | ICD-10-CM

## 2021-01-01 MED ORDER — BUDESONIDE-FORMOTEROL FUMARATE 160-4.5 MCG/ACT IN AERO: INHALATION_SPRAY | RESPIRATORY_TRACT | 1 refills | Status: DC

## 2021-01-01 MED ORDER — ALBUTEROL SULFATE HFA 108 (90 BASE) MCG/ACT IN AERS: 2.0000 | INHALATION_SPRAY | Freq: Four times a day (QID) | RESPIRATORY_TRACT | 3 refills | Status: DC | PRN

## 2021-05-11 DIAGNOSIS — O99345 Other mental disorders complicating the puerperium: Secondary | ICD-10-CM | POA: Diagnosis not present

## 2021-05-11 DIAGNOSIS — F418 Other specified anxiety disorders: Secondary | ICD-10-CM | POA: Diagnosis not present

## 2021-05-11 DIAGNOSIS — F53 Postpartum depression: Secondary | ICD-10-CM | POA: Diagnosis not present

## 2021-08-31 DIAGNOSIS — Z30430 Encounter for insertion of intrauterine contraceptive device: Secondary | ICD-10-CM | POA: Diagnosis not present

## 2021-08-31 DIAGNOSIS — Z113 Encounter for screening for infections with a predominantly sexual mode of transmission: Secondary | ICD-10-CM | POA: Diagnosis not present

## 2021-08-31 DIAGNOSIS — Z3202 Encounter for pregnancy test, result negative: Secondary | ICD-10-CM | POA: Diagnosis not present

## 2021-11-03 ENCOUNTER — Telehealth: Payer: Self-pay | Admitting: Family Medicine

## 2021-11-03 NOTE — Telephone Encounter (Signed)
Erroneous encounter. Please disregard.

## 2021-11-05 DIAGNOSIS — Z79899 Other long term (current) drug therapy: Secondary | ICD-10-CM | POA: Diagnosis not present

## 2021-11-11 DIAGNOSIS — F3177 Bipolar disorder, in partial remission, most recent episode mixed: Secondary | ICD-10-CM | POA: Diagnosis not present

## 2021-12-09 DIAGNOSIS — F3177 Bipolar disorder, in partial remission, most recent episode mixed: Secondary | ICD-10-CM | POA: Diagnosis not present

## 2022-01-17 ENCOUNTER — Encounter: Payer: Self-pay | Admitting: Family Medicine

## 2022-01-17 ENCOUNTER — Other Ambulatory Visit: Payer: Self-pay

## 2022-01-17 ENCOUNTER — Ambulatory Visit (INDEPENDENT_AMBULATORY_CARE_PROVIDER_SITE_OTHER): Payer: BC Managed Care – PPO | Admitting: Family Medicine

## 2022-01-17 VITALS — BP 118/82 | HR 98 | Temp 97.8°F | Resp 18 | Ht 62.0 in | Wt 179.0 lb

## 2022-01-17 DIAGNOSIS — J069 Acute upper respiratory infection, unspecified: Secondary | ICD-10-CM | POA: Diagnosis not present

## 2022-01-17 MED ORDER — HYDROCODONE BIT-HOMATROP MBR 5-1.5 MG/5ML PO SOLN: 5.0000 mL | Freq: Three times a day (TID) | ORAL | 0 refills | Status: DC | PRN

## 2022-01-17 NOTE — Progress Notes (Signed)
Subjective:    Patient ID: Angela Bryan, female    DOB: 1991-06-11, 30 y.o.   MRN: [id number]  HPI Symptoms began on Friday.  Symptoms include rhinorrhea, head congestion, nonproductive cough, and sore throat.  She took a negative COVID test over the weekend.  She denies any exposure to flu.  She denies any chest pain or shortness of breath at the present time.  She denies any myalgias.  She denies any pleurisy or hemoptysis or purulent sputum.  She denies any nausea or vomiting. Past Medical History:  Diagnosis Date   Allergy    Anxiety    Asthma    Depression    Diabetes mellitus without complication (HCC)    Headache    Mental disorder    Pelvic inflammatory disease 02/06/2017   PONV (postoperative nausea and vomiting)    Past Surgical History:  Procedure Laterality Date   CESAREAN SECTION  09/20/2020   Procedure: CESAREAN SECTION;  Surgeon: Christeen Douglas, MD;  Location: [facility] ORS;  Service: Obstetrics;;   Current Outpatient Medications on File Prior to Visit  Medication Sig Dispense Refill   acetaminophen (TYLENOL) 500 MG tablet Take 2 tablets (1,000 mg total) by mouth every 6 (six) hours. 90 tablet 0   albuterol (VENTOLIN HFA) 108 (90 Base) MCG/ACT inhaler Inhale 2 puffs into the lungs every 6 (six) hours as needed for wheezing or shortness of breath. 1 each 3   ALPRAZolam (XANAX) 1 MG tablet Take 1 mg by mouth daily as needed.     budesonide-formoterol (SYMBICORT) 160-4.5 MCG/ACT inhaler TAKE 2 PUFFS BY MOUTH TWICE A DAY 30.6 each 1   busPIRone (BUSPAR) 10 MG tablet Take 10 mg by mouth 2 (two) times daily.     FLUoxetine (PROZAC) 20 MG capsule Take 20 mg by mouth every morning.     FLUoxetine (PROZAC) 40 MG capsule Take 40 mg by mouth every morning.     fluticasone (FLONASE) 50 MCG/ACT nasal spray Place 2 sprays into both nostrils daily. 16 g 2   ibuprofen (ADVIL) 800 MG tablet Take 1 tablet (800 mg total) by mouth every 6 (six) hours. 90 tablet 0   levonorgestrel  (LILETTA, 52 MG,) 20.1 MCG/DAY IUD by Intrauterine route.     senna-docusate (SENOKOT-S) 8.6-50 MG tablet Take 2 tablets by mouth at bedtime. 60 tablet 0   VRAYLAR 3 MG capsule Take 3 mg by mouth at bedtime.     hydrOXYzine (ATARAX/VISTARIL) 10 MG tablet Take 10 mg by mouth every 4 (four) hours as needed for anxiety (take 1-2 tablets as needed for anxiety).  (Patient not taking: Reported on 01/17/2022)     omeprazole (PRILOSEC) 10 MG capsule Take 10 mg by mouth daily. (Patient not taking: Reported on 01/17/2022)     No current facility-administered medications on file prior to visit.   Allergies  Allergen Reactions   Green Coffee Bean-Yerba Mate Anaphylaxis    Has epi pen    Latex Rash   Social History   Socioeconomic History   Marital status: Single    Spouse name: Not on file   Number of children: Not on file   Years of education: Not on file   Highest education level: Not on file  Occupational History   Not on file  Tobacco Use   Smoking status: Never   Smokeless tobacco: Never  Substance and Sexual Activity   Alcohol use: Yes    Comment: occasional   Drug use: No   Sexual activity: Not  Currently  Other Topics Concern   Not on file  Social History Narrative   Not on file   Social Determinants of Health   Financial Resource Strain: Not on file  Food Insecurity: Not on file  Transportation Needs: Not on file  Physical Activity: Not on file  Stress: Not on file  Social Connections: Not on file  Intimate Partner Violence: Not on file     Review of Systems  All other systems reviewed and are negative.     Objective:   Physical Exam Constitutional:      Appearance: Normal appearance.  HENT:     Right Ear: Tympanic membrane and ear canal normal.     Left Ear: Tympanic membrane and ear canal normal.     Nose: Congestion and rhinorrhea present.     Mouth/Throat:     Mouth: Mucous membranes are moist.     Pharynx: Oropharynx is clear. No oropharyngeal exudate or  posterior oropharyngeal erythema.  Eyes:     Conjunctiva/sclera: Conjunctivae normal.  Cardiovascular:     Rate and Rhythm: Normal rate and regular rhythm.     Heart sounds: Normal heart sounds. No murmur heard. Pulmonary:     Effort: Pulmonary effort is normal. No respiratory distress.     Breath sounds: Normal breath sounds. No stridor. No wheezing or rales.  Musculoskeletal:     Cervical back: No rigidity.  Lymphadenopathy:     Cervical: No cervical adenopathy.  Neurological:     Mental Status: She is alert.          Assessment & Plan:   Acute URI Symptoms are consistent with a viral upper respiratory infection.  Recommended supportive care.  Sudafed as needed for neck congestion and rhinorrhea.  Ibuprofen 800 mg every 8 hours as needed for head congestion fever and body aches.  Hycodan 1 teaspoon every 6 hours as needed for cough.

## 2022-02-17 ENCOUNTER — Encounter: Payer: Self-pay | Admitting: Family Medicine

## 2022-02-17 ENCOUNTER — Ambulatory Visit: Payer: BC Managed Care – PPO | Admitting: Family Medicine

## 2022-02-17 ENCOUNTER — Other Ambulatory Visit: Payer: Self-pay

## 2022-02-17 ENCOUNTER — Telehealth: Payer: Self-pay | Admitting: Family Medicine

## 2022-02-17 VITALS — BP 118/82 | HR 92 | Temp 97.0°F | Resp 18 | Ht 62.0 in | Wt 179.0 lb

## 2022-02-17 DIAGNOSIS — R739 Hyperglycemia, unspecified: Secondary | ICD-10-CM | POA: Diagnosis not present

## 2022-02-17 LAB — CBC WITH DIFFERENTIAL/PLATELET
Absolute Monocytes: 708 cells/uL (ref 200–950)
Basophils Absolute: 41 cells/uL (ref 0–200)
Basophils Relative: 0.7 %
Eosinophils Absolute: 189 cells/uL (ref 15–500)
Eosinophils Relative: 3.2 %
HCT: 44.9 % (ref 35.0–45.0)
Hemoglobin: 14.8 g/dL (ref 11.7–15.5)
Lymphs Abs: 2390 cells/uL (ref 850–3900)
MCH: 28 pg (ref 27.0–33.0)
MCHC: 33 g/dL (ref 32.0–36.0)
MCV: 85 fL (ref 80.0–100.0)
MPV: 10.6 fL (ref 7.5–12.5)
Monocytes Relative: 12 %
Neutro Abs: 2572 cells/uL (ref 1500–7800)
Neutrophils Relative %: 43.6 %
Platelets: 272 10*3/uL (ref 140–400)
RBC: 5.28 10*6/uL — ABNORMAL HIGH (ref 3.80–5.10)
RDW: 13.2 % (ref 11.0–15.0)
Total Lymphocyte: 40.5 %
WBC: 5.9 10*3/uL (ref 3.8–10.8)

## 2022-02-17 LAB — COMPLETE METABOLIC PANEL WITH GFR
AG Ratio: 1.7 (calc) (ref 1.0–2.5)
ALT: 28 U/L (ref 6–29)
AST: 19 U/L (ref 10–30)
Albumin: 4.6 g/dL (ref 3.6–5.1)
Alkaline phosphatase (APISO): 62 U/L (ref 31–125)
BUN: 9 mg/dL (ref 7–25)
CO2: 25 mmol/L (ref 20–32)
Calcium: 9.3 mg/dL (ref 8.6–10.2)
Chloride: 105 mmol/L (ref 98–110)
Creat: 0.73 mg/dL (ref 0.50–0.97)
Globulin: 2.7 g/dL (calc) (ref 1.9–3.7)
Glucose, Bld: 83 mg/dL (ref 65–99)
Potassium: 4.1 mmol/L (ref 3.5–5.3)
Sodium: 138 mmol/L (ref 135–146)
Total Bilirubin: 0.3 mg/dL (ref 0.2–1.2)
Total Protein: 7.3 g/dL (ref 6.1–8.1)
eGFR: 113 mL/min/{1.73_m2} (ref >=60)

## 2022-02-17 LAB — HEMOGLOBIN A1C
Hgb A1c MFr Bld: 5.3 % of total Hgb (ref <5.7)
Mean Plasma Glucose: 105 mg/dL
eAG (mmol/L): 5.8 mmol/L

## 2022-02-17 NOTE — Progress Notes (Signed)
Subjective:    Patient ID: Angela Bryan, female    DOB: [birth year], 31 y.o.   MRN: [id number]  HPI Patient has a history of gestational diabetes.  Her mother also has prediabetes.  She is currently on Vraylar for her severe depression.  Since starting Vraylar she has gained weight per her personal report.  Recently she checked a fasting blood sugar that was 160.  She is also had a fasting blood sugar that was over 100.  She denies any polyuria polydipsia or blurry vision Past Medical History:  Diagnosis Date   Allergy    Anxiety    Asthma    Depression    Diabetes mellitus without complication (HCC)    Headache    Mental disorder    Pelvic inflammatory disease 02/06/2017   PONV (postoperative nausea and vomiting)    Past Surgical History:  Procedure Laterality Date   CESAREAN SECTION  09/20/2020   Procedure: CESAREAN SECTION;  Surgeon: Christeen Douglas, MD;  Location: [facility] ORS;  Service: Obstetrics;;   Current Outpatient Medications on File Prior to Visit  Medication Sig Dispense Refill   acetaminophen (TYLENOL) 500 MG tablet Take 2 tablets (1,000 mg total) by mouth every 6 (six) hours. 90 tablet 0   albuterol (VENTOLIN HFA) 108 (90 Base) MCG/ACT inhaler Inhale 2 puffs into the lungs every 6 (six) hours as needed for wheezing or shortness of breath. 1 each 3   ALPRAZolam (XANAX) 1 MG tablet Take 1 mg by mouth daily as needed.     budesonide-formoterol (SYMBICORT) 160-4.5 MCG/ACT inhaler TAKE 2 PUFFS BY MOUTH TWICE A DAY 30.6 each 1   busPIRone (BUSPAR) 10 MG tablet Take 10 mg by mouth 2 (two) times daily.     FLUoxetine (PROZAC) 20 MG capsule Take 20 mg by mouth every morning.     FLUoxetine (PROZAC) 40 MG capsule Take 40 mg by mouth every morning.     fluticasone (FLONASE) 50 MCG/ACT nasal spray Place 2 sprays into both nostrils daily. 16 g 2   ibuprofen (ADVIL) 800 MG tablet Take 1 tablet (800 mg total) by mouth every 6 (six) hours. 90 tablet 0    levonorgestrel (LILETTA, 52 MG,) 20.1 MCG/DAY IUD by Intrauterine route.     senna-docusate (SENOKOT-S) 8.6-50 MG tablet Take 2 tablets by mouth at bedtime. 60 tablet 0   VRAYLAR 3 MG capsule Take 3 mg by mouth at bedtime.     No current facility-administered medications on file prior to visit.   Allergies  Allergen Reactions   Green Coffee Bean-Yerba Mate Anaphylaxis    Has epi pen    Latex Rash   Social History   Socioeconomic History   Marital status: Single    Spouse name: Not on file   Number of children: Not on file   Years of education: Not on file   Highest education level: Not on file  Occupational History   Not on file  Tobacco Use   Smoking status: Never   Smokeless tobacco: Never  Substance and Sexual Activity   Alcohol use: Yes    Comment: occasional   Drug use: No   Sexual activity: Not Currently  Other Topics Concern   Not on file  Social History Narrative   Not on file   Social Determinants of Health   Financial Resource Strain: Not on file  Food Insecurity: Not on file  Transportation Needs: Not on file  Physical Activity: Not on file  Stress: Not on file  Social Connections: Not on file  Intimate Partner Violence: Not on file     Review of Systems  All other systems reviewed and are negative.     Objective:   Physical Exam Constitutional:      Appearance: Normal appearance.  Cardiovascular:     Rate and Rhythm: Normal rate and regular rhythm.     Heart sounds: Normal heart sounds. No murmur heard. Pulmonary:     Effort: Pulmonary effort is normal. No respiratory distress.     Breath sounds: Normal breath sounds. No stridor. No wheezing or rales.  Neurological:     Mental Status: She is alert.          Assessment & Plan:   Elevated blood sugar - Plan: Hemoglobin A1c, CBC with Differential/Platelet, COMPLETE METABOLIC PANEL WITH GFR Obtain a hemoglobin A1c today.  I suspect that the patient is developing  prediabetes or borderline type 2 diabetes.  She does not want to change Vraylar as she has had a difficult time managing her depression.  Therefore if her blood sugar is elevated she would be interested in Ozempic.

## 2022-02-17 NOTE — Telephone Encounter (Signed)
Patient received bill for DOS 01/17/2022. Requesting for bill to be resubmitted to [place], [place] [id number].  Copy of card in documents.   Please advise at [phone].

## 2022-03-03 NOTE — Telephone Encounter (Signed)
I have refiled to [facility] per patients request. ? ?Patient notified and verbalized understanding that she can disregard her bill at this time. ?

## 2022-03-24 DIAGNOSIS — F410 Panic disorder [episodic paroxysmal anxiety] without agoraphobia: Secondary | ICD-10-CM | POA: Diagnosis not present

## 2022-03-24 DIAGNOSIS — F419 Anxiety disorder, unspecified: Secondary | ICD-10-CM | POA: Diagnosis not present

## 2022-03-24 DIAGNOSIS — F3177 Bipolar disorder, in partial remission, most recent episode mixed: Secondary | ICD-10-CM | POA: Diagnosis not present

## 2022-04-07 IMAGING — US US OB COMP +14 WK
1 series · 13 of 28 positions shown · non-contrast
Comparison: none

CLINICAL DATA: Pregnancy.  Hypertension

EXAM:
OBSTETRICAL ULTRASOUND >14 WKS

[Series 1: ob us · 13 of 42 slices shown]
[im 2/42]
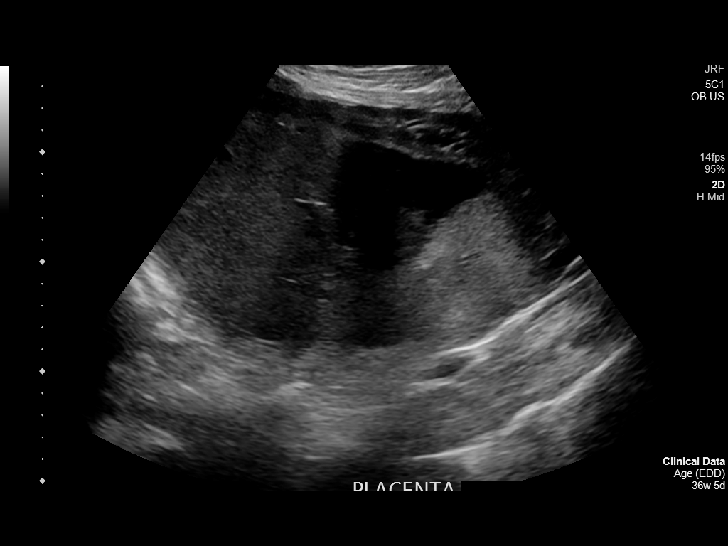
[im 5/42]
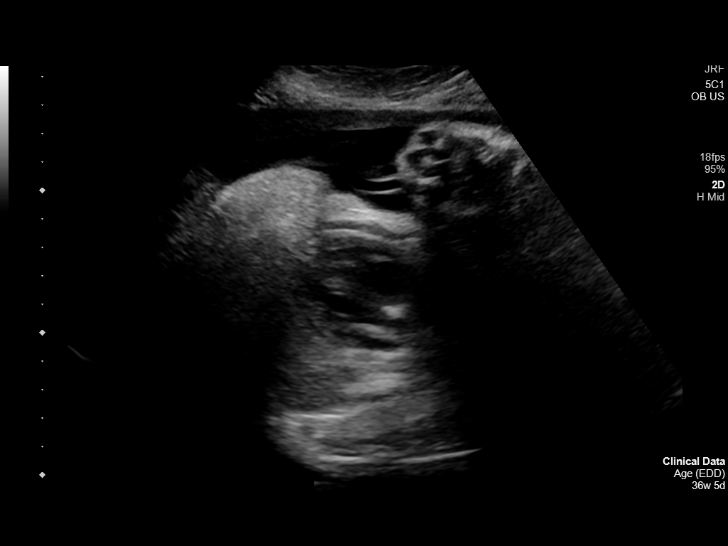
[im 8/42]
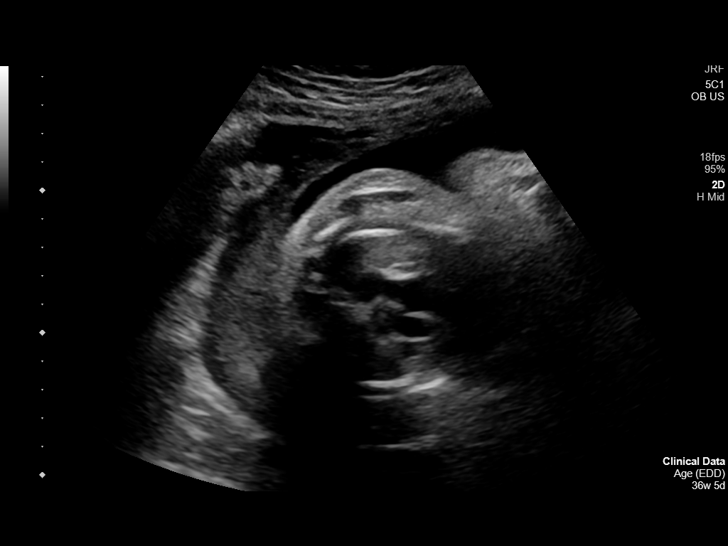
[im 11/42]
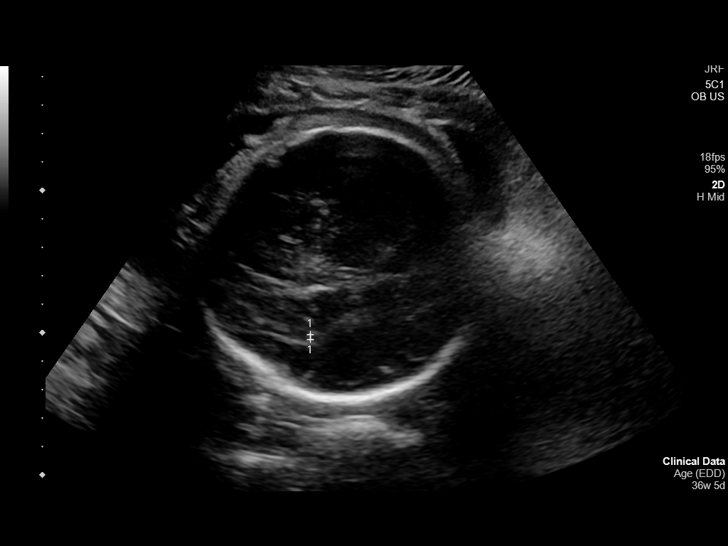
[im 14/42]
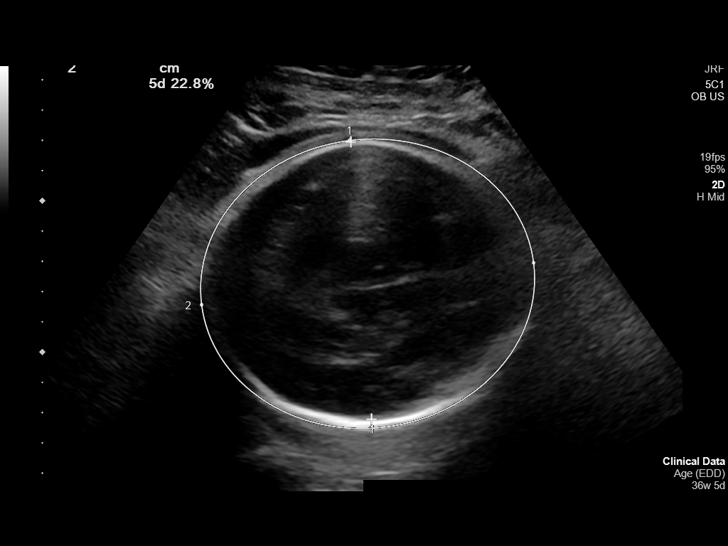
[im 17/42]
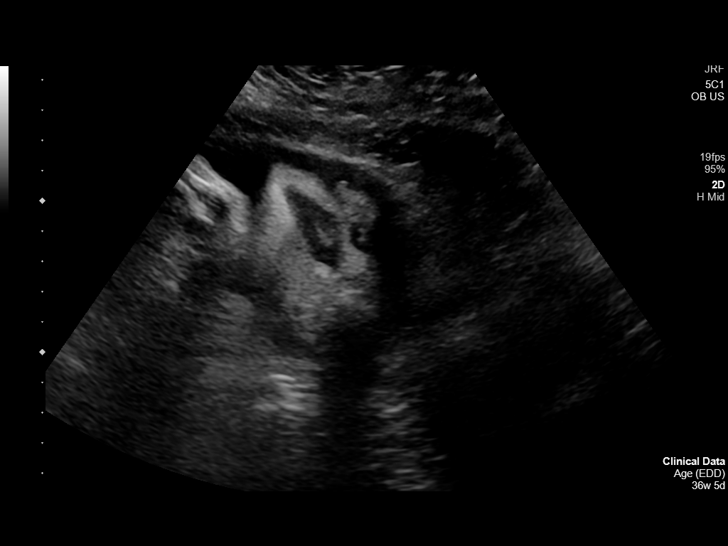
[im 22/42]
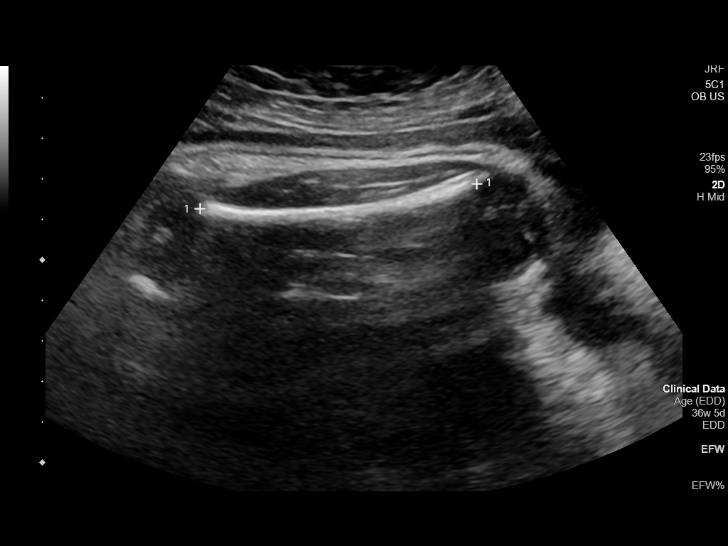
[im 25/42]
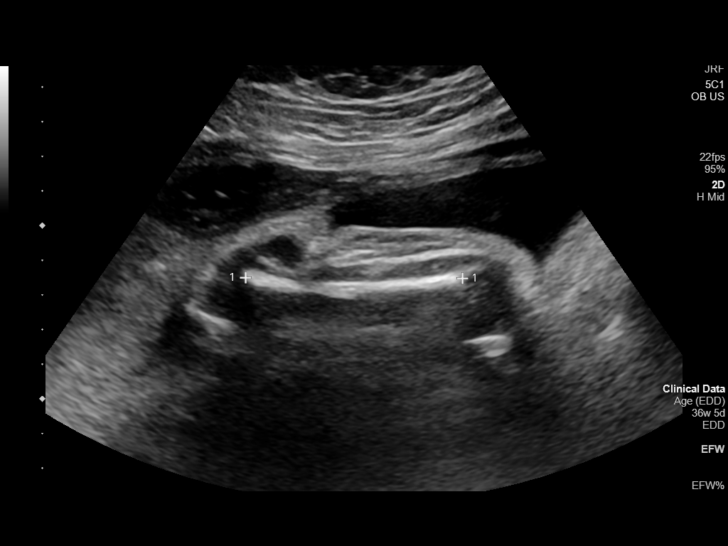
[im 28/42]
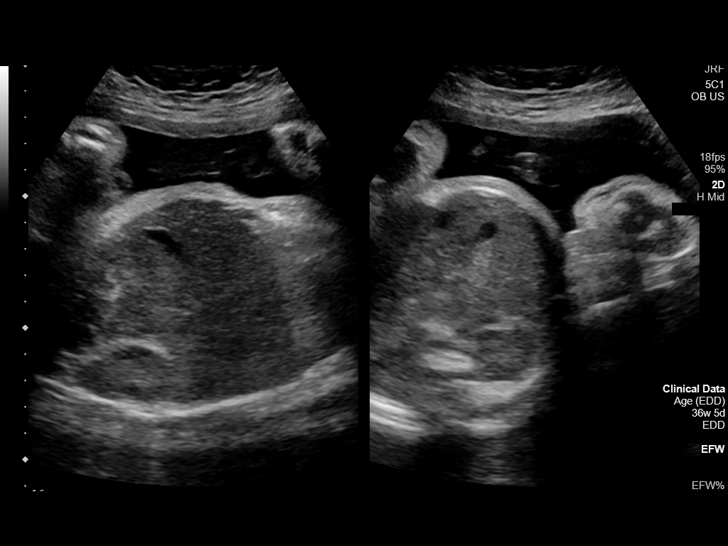
[im 31/42]
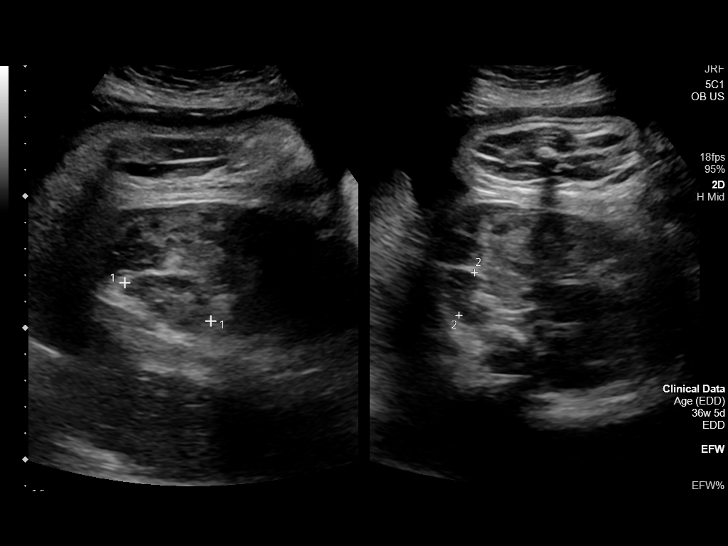
[im 34/42]
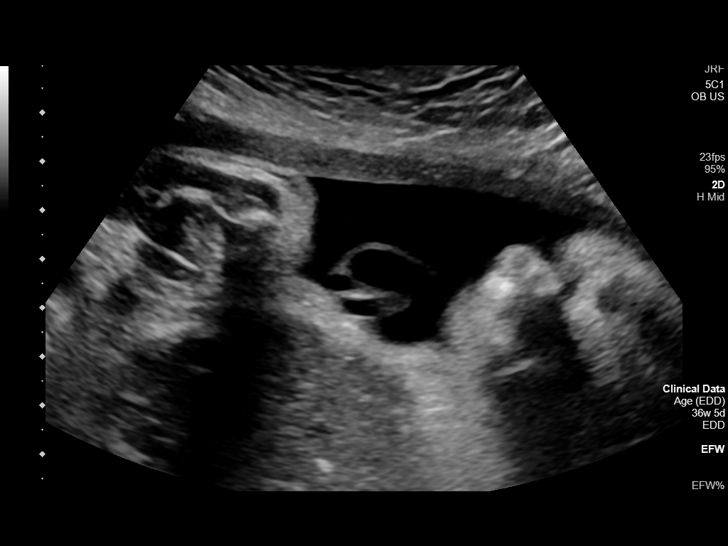
[im 37/42]
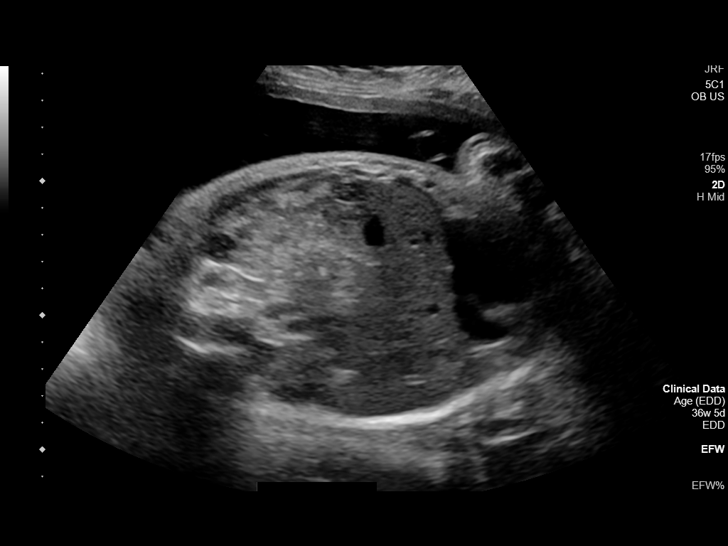
[im 40/42]
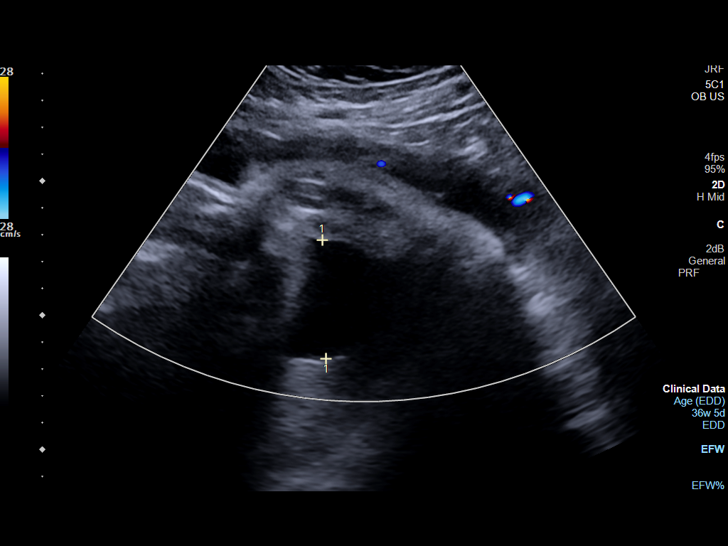

[13 of 28 positions shown; findings below may reference images not displayed]

FINDINGS: Number of Fetuses: 1

Heart Rate:  158 bpm

Movement: Yes

Presentation: Cephalic

Previa: No

Placental Location: Posterior

Amniotic Fluid (Subjective): Normal

Amniotic Fluid (Objective):

AFI = 12.2 cm (5%ile= 7.7 cm, 95%= 24.9 cm for 36 wks)

FETAL BIOMETRY

BPD: 9.2cm 37w 3d

HC:   32.3cm 36w 3d

AC:   33.9cm 37w 5d

FL:   6.9cm 35w 2d

Current Mean GA: 36w 5d US EDC: 10/10/2020

Assigned GA:  36w 5d Assigned EDC: 10/10/2020

Estimated Fetal Weight:  3,087g 62%ile

FETAL ANATOMY

Lateral Ventricles: Appears normal

Thalami/CSP: Appears normal

Posterior Fossa:  Not visualized

Nuchal Region: Not visualized

Upper Lip: Appears normal

Spine: Not visualized

4 Chamber Heart on Left: Appears normal

LVOT: Appears normal

RVOT: Not visualized

Stomach on Left: Appears normal

3 Vessel Cord: Appears normal

Cord Insertion site: Not visualized

Kidneys: Appears normal

Bladder: Appears normal

Extremities: Appears normal

Sex: Previously Seen

Technically difficult due to: Fetal positioning and advanced
gestational age

Maternal Findings:

Cervix:  Obscured.
IMPRESSION: 1. Single live intrauterine gestation in cephalic presentation.
Assigned gestational age of 36 weeks 5 days.
2. Amniotic fluid index of 12.2 cm, within normal limits.
3. Estimated fetal weight is in the 62nd percentile.
4. Limited fetal anatomic survey secondary to advanced gestational
age.

## 2022-06-01 ENCOUNTER — Encounter: Payer: Self-pay | Admitting: Orthopedic Surgery

## 2022-06-01 ENCOUNTER — Ambulatory Visit (INDEPENDENT_AMBULATORY_CARE_PROVIDER_SITE_OTHER): Payer: BC Managed Care – PPO

## 2022-06-01 ENCOUNTER — Ambulatory Visit: Payer: BC Managed Care – PPO | Admitting: Orthopedic Surgery

## 2022-06-01 VITALS — Ht 62.0 in | Wt 189.0 lb

## 2022-06-01 DIAGNOSIS — G8929 Other chronic pain: Secondary | ICD-10-CM | POA: Diagnosis not present

## 2022-06-01 DIAGNOSIS — M25562 Pain in left knee: Secondary | ICD-10-CM

## 2022-06-01 NOTE — Patient Instructions (Signed)
We have discussed taking medications for your ongoing pain ibuprofen or naproxen.  These are both NSAIDs (nonsteroidal anti-inflammatory drugs) and should not be taken together.  In clinic, we discussed taking these medications on a consistent basis for 7-14 days, regardless of how you feel.  This helps to build up the medication in your system, and work to minimize the inflammation.  After this period of time, I recommend going back to taking this medication as needed.  While taking this medication, if you have any side effects, including, but not limited to issues with your stomach, I would recommend stopping this medication.  If you have any questions, please do not hesitate to contact the clinic for clarification.  

## 2022-06-02 ENCOUNTER — Encounter: Payer: Self-pay | Admitting: Orthopedic Surgery

## 2022-06-02 NOTE — Progress Notes (Signed)
New Patient Visit  Assessment: Angela Bryan is a 31 y.o. female with the following: 1. Chronic pain of left knee  Plan: Angela Bryan left knee pain, that started approximately 2 years ago.  She stated she fell when she was pregnant.  She landed directly on her left knee once again approximately 6 months ago.  Radiographs have been negative.  Her pain is progressively worsening.  She has not been taking any medications on a consistent basis.  I recommended a Verrill course of NSAIDs on a regular basis, followed by NSAIDs as needed.  In addition, we will place referral for physical therapy.  Follow-up: Return in about 2 months (around 08/01/2022).  Subjective:  Chief Complaint  Patient presents with   Knee Pain    Lt knee pain that initially started after injury 2 yrs ago, fell again 6 mos ago. States pain has been at it's worse over the past few weeks.     History of Present Illness: Angela Bryan is a 31 y.o. female who presents for evaluation of left knee pain.  She is complaining of pain in the anterior left knee.  She reports that she fell, while pregnant, approximately 2 years ago.  She has some pain in her left knee at that time.  This progressively improved, although it never went away.  Approximate 6 months ago, she fell again.  Once again, she fell onto her left knee.  Her knee is never really improved without a fall.  It has been progressively worsening over the last couple weeks.  She takes some medications as needed, but not on a consistent basis.  She has not worked with physical therapy    Review of Systems: No fevers or chills No numbness or tingling No chest pain No shortness of breath No bowel or bladder dysfunction No GI distress No headaches   Medical History:  Past Medical History:  Diagnosis Date   Allergy    Anxiety    Asthma    Depression    Diabetes mellitus without complication (HCC)    Headache    Mental disorder    Pelvic  inflammatory disease 02/06/2017   PONV (postoperative nausea and vomiting)     Past Surgical History:  Procedure Laterality Date   CESAREAN SECTION  09/20/2020   Procedure: CESAREAN SECTION;  Surgeon: Christeen Douglas, MD;  Location: [facility] ORS;  Service: Obstetrics;;    No family history on file. Social History   Tobacco Use   Smoking status: Never   Smokeless tobacco: Never  Substance Use Topics   Alcohol use: Yes    Comment: occasional   Drug use: No    Allergies  Allergen Reactions   Green Coffee Bean-Yerba Mate Anaphylaxis    Has epi pen    Latex Rash    No outpatient medications have been marked as taking for the 06/01/22 encounter (Office Visit) with Oliver Barre, MD.    Objective: Ht 5\' 2"  (1.575 m)   Wt 189 lb (85.7 kg)   BMI 34.57 kg/m   Physical Exam:  General: Alert and oriented. and No acute distress. Gait: Left sided antalgic gait.  Evaluation of the left knee demonstrates mild effusion.  No bruising is appreciated.  Full range of motion.  Pain with hyperflexion.  She has some mild laxity on Lachman testing, but a firm endpoint.  Tenderness to palpation diffusely around the patella.  No tenderness to palpation of the posterior knee.  No increased laxity to varus  or valgus stress.  IMAGING: I personally ordered and reviewed the following images  X-rays of the left knee were obtained in clinic today.  No acute injuries are noted.  Neutral overall alignment.  Well-maintained joint space within the medial, lateral and patellofemoral compartments.  No osteophytes.  Impression: Negative left knee x-ray   New Medications:  No orders of the defined types were placed in this encounter.     Oliver Barre, MD  06/02/2022 8:41 PM

## 2022-06-23 DIAGNOSIS — F419 Anxiety disorder, unspecified: Secondary | ICD-10-CM | POA: Diagnosis not present

## 2022-06-23 DIAGNOSIS — F410 Panic disorder [episodic paroxysmal anxiety] without agoraphobia: Secondary | ICD-10-CM | POA: Diagnosis not present

## 2022-06-23 DIAGNOSIS — F3177 Bipolar disorder, in partial remission, most recent episode mixed: Secondary | ICD-10-CM | POA: Diagnosis not present

## 2022-06-23 DIAGNOSIS — Z79899 Other long term (current) drug therapy: Secondary | ICD-10-CM | POA: Diagnosis not present

## 2022-07-04 ENCOUNTER — Ambulatory Visit (HOSPITAL_COMMUNITY): Payer: BC Managed Care – PPO | Attending: Orthopedic Surgery | Admitting: Physical Therapy

## 2022-07-04 ENCOUNTER — Encounter (HOSPITAL_COMMUNITY): Payer: Self-pay | Admitting: Physical Therapy

## 2022-07-04 DIAGNOSIS — R269 Unspecified abnormalities of gait and mobility: Secondary | ICD-10-CM | POA: Insufficient documentation

## 2022-07-04 DIAGNOSIS — G8929 Other chronic pain: Secondary | ICD-10-CM | POA: Insufficient documentation

## 2022-07-04 DIAGNOSIS — M25562 Pain in left knee: Secondary | ICD-10-CM | POA: Insufficient documentation

## 2022-07-04 DIAGNOSIS — R262 Difficulty in walking, not elsewhere classified: Secondary | ICD-10-CM | POA: Diagnosis not present

## 2022-07-04 DIAGNOSIS — M6281 Muscle weakness (generalized): Secondary | ICD-10-CM | POA: Diagnosis not present

## 2022-07-04 NOTE — Therapy (Signed)
OUTPATIENT PHYSICAL THERAPY LOWER EXTREMITY EVALUATION   Patient Name: Angela Bryan MRN: [id number] DOB:[birth year], 31 y.o., female Today's Date: 07/04/2022   PT End of Session - 07/04/22 0850     Visit Number 1    Number of Visits 12    Date for PT Re-Evaluation 08/15/22    Authorization Type BCBS COMM PPO (no auth)    Progress Note Due on Visit 10    PT Start Time 0816    PT Stop Time 0853    PT Time Calculation (min) 37 min    Activity Tolerance Patient tolerated treatment well    Behavior During Therapy [facility] for tasks assessed/performed             Past Medical History:  Diagnosis Date   Allergy    Anxiety    Asthma    Depression    Diabetes mellitus without complication (HCC)    Headache    Mental disorder    Pelvic inflammatory disease 02/06/2017   PONV (postoperative nausea and vomiting)    Past Surgical History:  Procedure Laterality Date   CESAREAN SECTION  09/20/2020   Procedure: CESAREAN SECTION;  Surgeon: Christeen Douglas, MD;  Location: [facility] ORS;  Service: Obstetrics;;   Patient Active Problem List   Diagnosis Date Noted   Pre-eclampsia during pregnancy in third trimester, antepartum 09/19/2020   Gestational diabetes, diet controlled 09/19/2020   Supervision of high risk pregnancy, antepartum, third trimester 02/03/2020   GERD (gastroesophageal reflux disease) 05/14/2018   Generalized anxiety disorder 04/05/2018    PCP: Lynnea Ferrier MD  REFERRING PROVIDER: Oliver Barre, MD   REFERRING DIAG: Chronic pain of left knee   THERAPY DIAG:  Left knee pain, unspecified chronicity - Plan: PT plan of care cert/re-cert  Rationale for Evaluation and Treatment Rehabilitation  ONSET DATE: Chronic (several years)   SUBJECTIVE:   SUBJECTIVE STATEMENT: Patient presents to therapy with complaint of LT knee pain. No known MOI, though she does note several falls on that knee over the years. Had xrays which were negative. She is taking ibuprofen  for pain which does help. Pain is worse in the morning.   PERTINENT HISTORY: NA  PAIN:  Are you having pain? Yes: NPRS scale: 5/10 Pain location: inferior patella Pain description: soreness  Aggravating factors: morning, walking  Relieving factors: rest, meds   PRECAUTIONS: None  WEIGHT BEARING RESTRICTIONS No  FALLS:  Has patient fallen in last 6 months? Yes. Number of falls 2  LIVING ENVIRONMENT: Lives with: lives alone Lives in: House/apartment Stairs: Yes: External: 5 steps; bilateral but cannot reach both Has following equipment at home: None  OCCUPATION: Stay at home mom   PLOF: Independent  PATIENT GOALS "be able to chase my 31 year old around"    OBJECTIVE:   DIAGNOSTIC FINDINGS: Impression: Negative left knee x-ray   PATIENT SURVEYS:  FOTO 55% function   COGNITION:  Overall cognitive status: Within functional limits for tasks assessed     SENSATION: WFL  PALPATION: Min TTP about LT patellar tendon   FLEXIBILITY:  Min restriction in LT quad flexibility   LOWER EXTREMITY ROM:  Active ROM Right eval Left eval  Hip flexion    Hip extension    Hip abduction    Hip adduction    Hip internal rotation    Hip external rotation    Knee flexion 135 130  Knee extension 0 -4  Ankle dorsiflexion    Ankle plantarflexion    Ankle inversion  Ankle eversion     (Blank rows = not tested)  LOWER EXTREMITY MMT:  MMT Right eval Left eval  Hip flexion 5 4+  Hip extension 4+ 4  Hip abduction 4+ 4+  Hip adduction    Hip internal rotation    Hip external rotation    Knee flexion    Knee extension 5 4  Ankle dorsiflexion 5 5  Ankle plantarflexion    Ankle inversion    Ankle eversion     (Blank rows = not tested)   GAIT: Slight decrease in LT stance time, slight antalgic, no AD     TODAY'S TREATMENT: 07/04/22 Eval     PATIENT EDUCATION:  Education details: on eval findings, POC and HEP  Person educated: Patient Education method:  Explanation Education comprehension: verbalized understanding   HOME EXERCISE PROGRAM: Access Code: [email or web address] URL: [email or web address] Date: 07/04/2022 Prepared by: Georges Lynch  Exercises - Supine Quad Set  - 2-3 x daily - 7 x weekly - 1-2 sets - 10 reps - 5 second hold - Active Straight Leg Raise with Quad Set  - 2-3 x daily - 7 x weekly - 1-2 sets - 10 reps - Supine Hamstring Stretch with Strap  - 2-3 x daily - 7 x weekly - 1 sets - 10 reps - 10 sec hold - Prone Quadriceps Stretch with Strap  - 2-3 x daily - 7 x weekly - 1 sets - 10 reps - 10 sec hold  ASSESSMENT:  CLINICAL IMPRESSION: Patient is a 31 y.o. female who presents to physical therapy with complaint of LT knee pain. Patient demonstrates muscle weakness, reduced ROM, and fascial restrictions which are likely contributing to symptoms of pain and are negatively impacting patient ability to perform ADLs and functional mobility tasks. Patient will benefit from skilled physical therapy services to address these deficits to reduce pain and improve level of function with ADLs and functional mobility tasks.   OBJECTIVE IMPAIRMENTS Abnormal gait, decreased activity tolerance, decreased mobility, difficulty walking, decreased ROM, decreased strength, increased fascial restrictions, improper body mechanics, and pain.   ACTIVITY LIMITATIONS bending, standing, squatting, stairs, transfers, and locomotion level  PARTICIPATION LIMITATIONS: cleaning, laundry, shopping, community activity, and yard work  PERSONAL FACTORS  None  are also affecting patient's functional outcome.   REHAB POTENTIAL: Good  CLINICAL DECISION MAKING: Stable/uncomplicated  EVALUATION COMPLEXITY: Low   GOALS: [place] TERM GOALS: Target date: 07/25/2022  Patient will be independent with initial HEP and self-management strategies to improve functional outcomes Baseline:  Goal status: INITIAL   LONG TERM GOALS: Target date:  08/15/2022  Patient will be independent with advanced HEP and self-management strategies to improve functional outcomes Baseline:  Goal status:   2.  Patient will improve FOTO score to predicted value to indicate improvement in functional outcomes Baseline: 55% Goal status: INITIAL  3.  Patient will have LT knee AROM 0-135 degrees to improve functional mobility and facilitate squatting to pick up items from floor. Baseline: -4 to 130  Goal status: INITIAL  4. Patient will have 5/5 MMT throughout BLE to improve ability to perform functional mobility, stair ambulation and ADLs.  Baseline: See MMT Goal status: INITIAL    PLAN: PT FREQUENCY: 1-2x/week  PT DURATION: 6 weeks  PLANNED INTERVENTIONS: Therapeutic exercises, Therapeutic activity, Neuromuscular re-education, Balance training, Gait training, Patient/Family education, Joint manipulation, Joint mobilization, Stair training, Aquatic Therapy, Dry Needling, Electrical stimulation, Spinal manipulation, Spinal mobilization, Cryotherapy, Moist heat, scar mobilization, Taping, Traction, Ultrasound, Biofeedback, Ionotophoresis 4mg /ml Dexamethasone, and Manual  therapy.   PLAN FOR NEXT SESSION: Glute and quad strength. Progress as tolerated. Bridge, sidelying leg raise, clam   8:54 AM, 07/04/22 Josue Hector PT DPT  Physical Therapist with [facility]  [phone]

## 2022-07-12 ENCOUNTER — Ambulatory Visit (HOSPITAL_COMMUNITY): Payer: BC Managed Care – PPO | Attending: Orthopedic Surgery

## 2022-07-12 DIAGNOSIS — M25562 Pain in left knee: Secondary | ICD-10-CM | POA: Insufficient documentation

## 2022-07-12 NOTE — Therapy (Signed)
OUTPATIENT PHYSICAL THERAPY LOWER EXTREMITY TREATMENT   Patient Name: Angela Bryan MRN: [id number] DOB:11-Jul-1991, 31 y.o., female Today's Date: 07/12/2022   PT End of Session - 07/12/22 1432     Visit Number 2    Number of Visits 12    Date for PT Re-Evaluation 08/15/22    Authorization Type BCBS COMM PPO (no auth)    Progress Note Due on Visit 10    PT Start Time 1431    PT Stop Time 1514    PT Time Calculation (min) 43 min    Activity Tolerance Patient tolerated treatment well    Behavior During Therapy WFL for tasks assessed/performed              Past Medical History:  Diagnosis Date   Allergy    Anxiety    Asthma    Depression    Diabetes mellitus without complication ([place])    Headache    Mental disorder    Pelvic inflammatory disease 02/06/2017   PONV (postoperative nausea and vomiting)    Past Surgical History:  Procedure Laterality Date   CESAREAN SECTION  09/20/2020   Procedure: CESAREAN SECTION;  Surgeon: Benjaman Kindler, MD;  Location: [facility] ORS;  Service: Obstetrics;;   Patient Active Problem List   Diagnosis Date Noted   Pre-eclampsia during pregnancy in third trimester, antepartum 09/19/2020   Gestational diabetes, diet controlled 09/19/2020   Supervision of high risk pregnancy, antepartum, third trimester 02/03/2020   GERD (gastroesophageal reflux disease) 05/14/2018   Generalized anxiety disorder 04/05/2018    PCP: Jenna Luo MD  REFERRING PROVIDER: Mordecai Rasmussen, MD   REFERRING DIAG: Chronic pain of left knee   THERAPY DIAG:  Left knee pain, unspecified chronicity  Rationale for Evaluation and Treatment Rehabilitation  ONSET DATE: Chronic (several years)   SUBJECTIVE:   SUBJECTIVE STATEMENT: Patient reports she is pain-free today; feeling much better overall; able to walk better over the weekend.   PERTINENT HISTORY: NA  PAIN:  Are you having pain? Yes: NPRS scale: 0/10 Pain location: inferior patella Pain  description: soreness  Aggravating factors: morning, walking  Relieving factors: rest, meds   PRECAUTIONS: None  WEIGHT BEARING RESTRICTIONS No  FALLS:  Has patient fallen in last 6 months? Yes. Number of falls 2  LIVING ENVIRONMENT: Lives with: lives alone Lives in: House/apartment Stairs: Yes: External: 5 steps; bilateral but cannot reach both Has following equipment at home: None  OCCUPATION: Stay at home mom   PLOF: Independent  PATIENT GOALS "be able to chase my 31 year old around"    OBJECTIVE:   DIAGNOSTIC FINDINGS: Impression: Negative left knee x-ray   PATIENT SURVEYS:  FOTO 55% function   COGNITION:  Overall cognitive status: Within functional limits for tasks assessed     SENSATION: WFL  PALPATION: Min TTP about LT patellar tendon   FLEXIBILITY:  Min restriction in LT quad flexibility   LOWER EXTREMITY ROM:  Active ROM Right eval Left eval Left 07/12/22  Hip flexion     Hip extension     Hip abduction     Hip adduction     Hip internal rotation     Hip external rotation     Knee flexion 135 130 137  Knee extension 0 -4 -2  Ankle dorsiflexion     Ankle plantarflexion     Ankle inversion     Ankle eversion      (Blank rows = not tested)  LOWER EXTREMITY MMT:  MMT Right  eval Left eval Left 07/12/22  Hip flexion 5 4+ 5  Hip extension 4+ 4 4  Hip abduction 4+ 4+   Hip adduction     Hip internal rotation     Hip external rotation     Knee flexion     Knee extension 5 4 4+  Ankle dorsiflexion 5 5   Ankle plantarflexion     Ankle inversion     Ankle eversion      (Blank rows = not tested)   GAIT: Slight decrease in LT stance time, slight antalgic, no AD     TODAY'S TREATMENT: 07/12/22 Review of HEP and goals Supine: Quad set 5" hold x 10 Quad set with SLR 2 x 10 Hamstring stretch 5 x 20" SAQ's 2 x 10 2# Bridge 2 x 10  Side lying: Clam 2 x 10 bilaterally Hip abduction 2 x 10 bilaterally  07/04/22 Eval     PATIENT  EDUCATION:  Education details: on eval findings, POC and HEP  Person educated: Patient Education method: Explanation Education comprehension: verbalized understanding   HOME EXERCISE PROGRAM: Access Code: [email or web address] URL: [email or web address] Date: 07/04/2022 Prepared by: Josue Hector  Exercises - Supine Quad Set  - 2-3 x daily - 7 x weekly - 1-2 sets - 10 reps - 5 second hold - Active Straight Leg Raise with Quad Set  - 2-3 x daily - 7 x weekly - 1-2 sets - 10 reps - Supine Hamstring Stretch with Strap  - 2-3 x daily - 7 x weekly - 1 sets - 10 reps - 10 sec hold - Prone Quadriceps Stretch with Strap  - 2-3 x daily - 7 x weekly - 1 sets - 10 reps - 10 sec hold  ASSESSMENT:  CLINICAL IMPRESSION: Today's session started with review of HEP and goals. Patient verbalizes agreement with set rehab goals.  Able to demonstrate HEP correctly. Progressed HEP; adding knee and hip strengthening exercise.  Patient's left leg fatigued at the end of treatment.  Patient with improved left knee mobility and extension strengthening but continues with left hip extension weakness. Patient will benefit from continued skilled therapy interventions to address deficits and improve functional mobility.   OBJECTIVE IMPAIRMENTS Abnormal gait, decreased activity tolerance, decreased mobility, difficulty walking, decreased ROM, decreased strength, increased fascial restrictions, improper body mechanics, and pain.   ACTIVITY LIMITATIONS bending, standing, squatting, stairs, transfers, and locomotion level  PARTICIPATION LIMITATIONS: cleaning, laundry, shopping, community activity, and yard work  PERSONAL FACTORS  None  are also affecting patient's functional outcome.   REHAB POTENTIAL: Good  CLINICAL DECISION MAKING: Stable/uncomplicated  EVALUATION COMPLEXITY: Low   GOALS: [place] TERM GOALS: Target date: 07/25/2022  Patient will be independent with initial HEP and self-management strategies  to improve functional outcomes Baseline:  Goal status: met  LONG TERM GOALS: Target date: 08/15/2022  Patient will be independent with advanced HEP and self-management strategies to improve functional outcomes Baseline:  Goal status:   2.  Patient will improve FOTO score to predicted value to indicate improvement in functional outcomes Baseline: 55% Goal status: ongoing  3.  Patient will have LT knee AROM 0-135 degrees to improve functional mobility and facilitate squatting to pick up items from floor. Baseline: -4 to 130  Goal status: ongoing  4. Patient will have 5/5 MMT throughout BLE to improve ability to perform functional mobility, stair ambulation and ADLs.  Baseline: See MMT Goal status: ongoing    PLAN: PT FREQUENCY: 1-2x/week  PT DURATION: 6 weeks  PLANNED  INTERVENTIONS: Therapeutic exercises, Therapeutic activity, Neuromuscular re-education, Balance training, Gait training, Patient/Family education, Joint manipulation, Joint mobilization, Stair training, Aquatic Therapy, Dry Needling, Electrical stimulation, Spinal manipulation, Spinal mobilization, Cryotherapy, Moist heat, scar mobilization, Taping, Traction, Ultrasound, Biofeedback, Ionotophoresis 4mg /ml Dexamethasone, and Manual therapy.   PLAN FOR NEXT SESSION: Glute and quad strength. Progress as tolerated.   3:14 PM, 07/12/22 Denica Web Small Curry Seefeldt MPT [place] [facility] [place] [phone]

## 2022-07-25 ENCOUNTER — Ambulatory Visit (HOSPITAL_COMMUNITY): Payer: BC Managed Care – PPO

## 2022-07-25 DIAGNOSIS — M25562 Pain in left knee: Secondary | ICD-10-CM | POA: Diagnosis not present

## 2022-07-25 NOTE — Therapy (Signed)
OUTPATIENT PHYSICAL THERAPY LOWER EXTREMITY DISCHARGE NOTE  PHYSICAL THERAPY DISCHARGE SUMMARY  Visits from Start of Care: 3  Current functional level related to goals / functional outcomes: SEE BELOW   Remaining deficits: SEE BELOW   Education / Equipment: SEE BELOW   Patient agrees to discharge. Patient goals were met. Patient is being discharged due to meeting the stated rehab goals.    Patient Name: Angela Bryan MRN: [id number] DOB:Oct 26, 1991, 31 y.o., female Today's Date: 07/25/2022   PT End of Session - 07/25/22 1646     Visit Number 3    Number of Visits 12    Date for PT Re-Evaluation 08/15/22    Authorization Type BCBS COMM PPO (no auth)    Progress Note Due on Visit 10    PT Start Time 1647    PT Stop Time 1703    PT Time Calculation (min) 16 min    Activity Tolerance Patient tolerated treatment well    Behavior During Therapy [facility] for tasks assessed/performed               Past Medical History:  Diagnosis Date   Allergy    Anxiety    Asthma    Depression    Diabetes mellitus without complication ([place])    Headache    Mental disorder    Pelvic inflammatory disease 02/06/2017   PONV (postoperative nausea and vomiting)    Past Surgical History:  Procedure Laterality Date   CESAREAN SECTION  09/20/2020   Procedure: CESAREAN SECTION;  Surgeon: Benjaman Kindler, MD;  Location: [facility] ORS;  Service: Obstetrics;;   Patient Active Problem List   Diagnosis Date Noted   Pre-eclampsia during pregnancy in third trimester, antepartum 09/19/2020   Gestational diabetes, diet controlled 09/19/2020   Supervision of high risk pregnancy, antepartum, third trimester 02/03/2020   GERD (gastroesophageal reflux disease) 05/14/2018   Generalized anxiety disorder 04/05/2018    PCP: Jenna Luo MD  REFERRING PROVIDER: Mordecai Rasmussen, MD   REFERRING DIAG: Chronic pain of left knee   THERAPY DIAG:  Left knee pain, unspecified chronicity  Rationale  for Evaluation and Treatment Rehabilitation  ONSET DATE: Chronic (several years)   SUBJECTIVE:   SUBJECTIVE STATEMENT: No pain, stiffness in the mornings only; " I'm ready for discharge" PERTINENT HISTORY: NA  PAIN:  Are you having pain? Yes: NPRS scale: 0/10 Pain location: inferior patella Pain description: soreness  Aggravating factors: morning, walking  Relieving factors: rest, meds   PRECAUTIONS: None  WEIGHT BEARING RESTRICTIONS No  FALLS:  Has patient fallen in last 6 months? Yes. Number of falls 2  LIVING ENVIRONMENT: Lives with: lives alone Lives in: House/apartment Stairs: Yes: External: 5 steps; bilateral but cannot reach both Has following equipment at home: None  OCCUPATION: Stay at home mom   PLOF: [place] "be able to chase my 31 year old around"    OBJECTIVE:   DIAGNOSTIC FINDINGS: Impression: Negative left knee x-ray   PATIENT SURVEYS:  FOTO 55% function   COGNITION:  Overall cognitive status: Within functional limits for tasks assessed     SENSATION: WFL  PALPATION: Min TTP about LT patellar tendon   FLEXIBILITY:  Min restriction in LT quad flexibility   LOWER EXTREMITY ROM:  Active ROM Right eval Left eval Left 07/12/22 Left 07/25/22  Hip flexion      Hip extension      Hip abduction      Hip adduction      Hip internal rotation  Hip external rotation      Knee flexion 135 130 137 141  Knee extension 0 -4 -2 0  Ankle dorsiflexion      Ankle plantarflexion      Ankle inversion      Ankle eversion       (Blank rows = not tested)  LOWER EXTREMITY MMT:  MMT Right eval Left eval Left 07/12/22 Left 07/25/22  Hip flexion 5 4+ 5 5  Hip extension 4+ 4 4 5   Hip abduction 4+ 4+    Hip adduction      Hip internal rotation      Hip external rotation      Knee flexion      Knee extension 5 4 4+ 5  Ankle dorsiflexion 5 5  5   Ankle plantarflexion      Ankle inversion      Ankle eversion       (Blank  rows = not tested)   GAIT: Slight decrease in LT stance time, slight antalgic, no AD     TODAY'S TREATMENT: 07/25/22 Discharge note FOTO 77 Left knee AROM 0-141  07/12/22 Review of HEP and goals Supine: Quad set 5" hold x 10 Quad set with SLR 2 x 10 Hamstring stretch 5 x 20" SAQ's 2 x 10 2# Bridge 2 x 10  Side lying: Clam 2 x 10 bilaterally Hip abduction 2 x 10 bilaterally  07/04/22 Eval     PATIENT EDUCATION:  Education details: on eval findings, POC and HEP  Person educated: Patient Education method: Explanation Education comprehension: verbalized understanding   HOME EXERCISE PROGRAM: Access Code: [email or web address] URL: [email or web address] Date: 07/04/2022 Prepared by: Josue Hector  Exercises - Supine Quad Set  - 2-3 x daily - 7 x weekly - 1-2 sets - 10 reps - 5 second hold - Active Straight Leg Raise with Quad Set  - 2-3 x daily - 7 x weekly - 1-2 sets - 10 reps - Supine Hamstring Stretch with Strap  - 2-3 x daily - 7 x weekly - 1 sets - 10 reps - 10 sec hold - Prone Quadriceps Stretch with Strap  - 2-3 x daily - 7 x weekly - 1 sets - 10 reps - 10 sec hold  ASSESSMENT:  CLINICAL IMPRESSION: Patient has met all set rehab goals. She is agreeable to discharge at this time.   OBJECTIVE IMPAIRMENTS Abnormal gait, decreased activity tolerance, decreased mobility, difficulty walking, decreased ROM, decreased strength, increased fascial restrictions, improper body mechanics, and pain.   ACTIVITY LIMITATIONS bending, standing, squatting, stairs, transfers, and locomotion level  PARTICIPATION LIMITATIONS: cleaning, laundry, shopping, community activity, and yard work  PERSONAL FACTORS  None  are also affecting patient's functional outcome.   REHAB POTENTIAL: Good  CLINICAL DECISION MAKING: Stable/uncomplicated  EVALUATION COMPLEXITY: Low   GOALS: [place] TERM GOALS: Target date: 07/25/2022  Patient will be independent with initial HEP and  self-management strategies to improve functional outcomes Baseline:  Goal status: met  LONG TERM GOALS: Target date: 08/15/2022  Patient will be independent with advanced HEP and self-management strategies to improve functional outcomes Baseline:  Goal status: met  2.  Patient will improve FOTO score to predicted value to indicate improvement in functional outcomes Baseline: 55%; 7/31 77 Goal status: met  3.  Patient will have LT knee AROM 0-135 degrees to improve functional mobility and facilitate squatting to pick up items from floor. Baseline: -4 to 130  Goal status: ongoing  4. Patient will have 5/5 MMT throughout BLE  to improve ability to perform functional mobility, stair ambulation and ADLs.  Baseline: See MMT Goal status: ongoing    PLAN: PT FREQUENCY: 1-2x/week  PT DURATION: 6 weeks  PLANNED INTERVENTIONS: Therapeutic exercises, Therapeutic activity, Neuromuscular re-education, Balance training, Gait training, Patient/Family education, Joint manipulation, Joint mobilization, Stair training, Aquatic Therapy, Dry Needling, Electrical stimulation, Spinal manipulation, Spinal mobilization, Cryotherapy, Moist heat, scar mobilization, Taping, Traction, Ultrasound, Biofeedback, Ionotophoresis 4mg /ml Dexamethasone, and Manual therapy.   PLAN FOR NEXT SESSION: discharge  5:05 PM, 07/25/22 Jehan Bonano Small Tyrisha Benninger MPT [place] [facility] [place] [phone]

## 2022-08-01 ENCOUNTER — Encounter (HOSPITAL_COMMUNITY): Payer: BC Managed Care – PPO

## 2022-08-08 ENCOUNTER — Encounter (HOSPITAL_COMMUNITY): Payer: BC Managed Care – PPO

## 2022-08-09 ENCOUNTER — Ambulatory Visit: Payer: BC Managed Care – PPO | Admitting: Orthopedic Surgery

## 2022-08-15 ENCOUNTER — Encounter (HOSPITAL_COMMUNITY): Payer: BC Managed Care – PPO | Admitting: Physical Therapy

## 2022-09-22 DIAGNOSIS — F3177 Bipolar disorder, in partial remission, most recent episode mixed: Secondary | ICD-10-CM | POA: Diagnosis not present

## 2022-09-22 DIAGNOSIS — F410 Panic disorder [episodic paroxysmal anxiety] without agoraphobia: Secondary | ICD-10-CM | POA: Diagnosis not present

## 2022-09-22 DIAGNOSIS — F419 Anxiety disorder, unspecified: Secondary | ICD-10-CM | POA: Diagnosis not present

## 2022-09-22 DIAGNOSIS — Z79899 Other long term (current) drug therapy: Secondary | ICD-10-CM | POA: Diagnosis not present

## 2022-11-16 DIAGNOSIS — Z79899 Other long term (current) drug therapy: Secondary | ICD-10-CM | POA: Diagnosis not present

## 2022-12-29 DIAGNOSIS — F3177 Bipolar disorder, in partial remission, most recent episode mixed: Secondary | ICD-10-CM | POA: Diagnosis not present

## 2022-12-29 DIAGNOSIS — F410 Panic disorder [episodic paroxysmal anxiety] without agoraphobia: Secondary | ICD-10-CM | POA: Diagnosis not present

## 2022-12-29 DIAGNOSIS — F419 Anxiety disorder, unspecified: Secondary | ICD-10-CM | POA: Diagnosis not present

## 2023-02-16 ENCOUNTER — Encounter: Payer: Self-pay | Admitting: Family Medicine

## 2023-02-16 ENCOUNTER — Ambulatory Visit (INDEPENDENT_AMBULATORY_CARE_PROVIDER_SITE_OTHER): Payer: BC Managed Care – PPO | Admitting: Family Medicine

## 2023-02-16 VITALS — BP 120/90 | HR 109 | Temp 98.5°F | Ht 62.0 in | Wt 191.0 lb

## 2023-02-16 DIAGNOSIS — Z20818 Contact with and (suspected) exposure to other bacterial communicable diseases: Secondary | ICD-10-CM | POA: Diagnosis not present

## 2023-02-16 DIAGNOSIS — R070 Pain in throat: Secondary | ICD-10-CM | POA: Diagnosis not present

## 2023-02-16 DIAGNOSIS — B349 Viral infection, unspecified: Secondary | ICD-10-CM | POA: Diagnosis not present

## 2023-02-16 NOTE — Assessment & Plan Note (Signed)
Reassured patient that symptoms and exam findings are most consistent with a viral illness and explained lack of efficacy of antibiotics against viruses.  Offered flu and covid testing but patient declined, Discussed expected course and features suggestive of secondary bacterial infection.  Continue supportive care. Increase fluid intake with water or electrolyte solution like pedialyte. Encouraged acetaminophen as needed for fever/pain. Encouraged salt water gargling, chloraseptic spray and throat lozenges. Encouraged OTC guaifenesin. Encouraged saline sinus flushes and/or neti with humidified air.

## 2023-02-16 NOTE — Addendum Note (Signed)
Addended by: Rubie Maid on: 02/16/2023 04:32 PM   Modules accepted: Orders

## 2023-02-16 NOTE — Progress Notes (Addendum)
   Acute Office Visit  Subjective:     Patient ID: Angela Bryan, female    DOB: 20-Jun-1991, 32 y.o.   MRN: [id number]  No chief complaint on file.   HPI Patient is in today for 3 days of fatigue, headache, rhinorrhea, and 1 day of fever.  Denies cough, congestion, shortness of breath, wheezing, sore throat Exposed to strep by multiple family members and would like strep test. No home covid test Has tried Tylenol.   Review of Systems  All other systems reviewed and are negative.       Objective:    BP (!) 120/90   Pulse (!) 109   Temp 98.5 F (36.9 C) (Oral)   Ht 5' 2"$  (1.575 m)   Wt 191 lb (86.6 kg)   LMP  (Approximate)   SpO2 97%   BMI 34.93 kg/m    Physical Exam Vitals and nursing note reviewed.  Constitutional:      Appearance: Normal appearance. She is normal weight.  HENT:     Head: Normocephalic and atraumatic.     Nose: Nose normal.     Mouth/Throat:     Mouth: Mucous membranes are moist.     Pharynx: Oropharynx is clear.  Eyes:     Conjunctiva/sclera: Conjunctivae normal.  Musculoskeletal:     Cervical back: No tenderness.  Lymphadenopathy:     Cervical: No cervical adenopathy.  Skin:    General: Skin is warm and dry.  Neurological:     General: No focal deficit present.     Mental Status: She is alert and oriented to person, place, and time. Mental status is at baseline.  Psychiatric:        Mood and Affect: Mood normal.        Behavior: Behavior normal.        Thought Content: Thought content normal.        Judgment: Judgment normal.     Results for orders placed or performed in visit on 02/16/23  STREP GROUP A AG, W/REFLEX TO CULT   Specimen: Throat  Result Value Ref Range   Streptococcus Group A AG NOT DETECTED NOT DETECTED  Culture, Group A Strep  Result Value Ref Range   MICRO NUMBER: [id number]    SPECIMEN QUALITY: Adequate    SOURCE: THROAT    STATUS: FINAL    RESULT: No group A Streptococcus isolated          Assessment & Plan:   Problem List Items Addressed This Visit       Other   Viral illness - Primary    Reassured patient that symptoms and exam findings are most consistent with a viral illness and explained lack of efficacy of antibiotics against viruses.  Offered flu and covid testing but patient declined, Discussed expected course and features suggestive of secondary bacterial infection.  Continue supportive care. Increase fluid intake with water or electrolyte solution like pedialyte. Encouraged acetaminophen as needed for fever/pain. Encouraged salt water gargling, chloraseptic spray and throat lozenges. Encouraged OTC guaifenesin. Encouraged saline sinus flushes and/or neti with humidified air.        Other Visit Diagnoses     Exposure to Streptococcal pharyngitis       Relevant Orders   STREP GROUP A AG, W/REFLEX TO CULT (Completed)       No orders of the defined types were placed in this encounter.   Return if symptoms worsen or fail to improve.  Rubie Maid, FNP

## 2023-02-16 NOTE — Patient Instructions (Signed)

## 2023-02-18 LAB — CULTURE, GROUP A STREP
MICRO NUMBER:: 14601081
SPECIMEN QUALITY:: ADEQUATE

## 2023-02-18 LAB — STREP GROUP A AG, W/REFLEX TO CULT: Streptococcus Group A AG: NOT DETECTED

## 2023-02-23 ENCOUNTER — Encounter: Payer: Self-pay | Admitting: Radiology

## 2023-02-27 DIAGNOSIS — Z1331 Encounter for screening for depression: Secondary | ICD-10-CM | POA: Diagnosis not present

## 2023-02-27 DIAGNOSIS — F410 Panic disorder [episodic paroxysmal anxiety] without agoraphobia: Secondary | ICD-10-CM | POA: Diagnosis not present

## 2023-02-27 DIAGNOSIS — F331 Major depressive disorder, recurrent, moderate: Secondary | ICD-10-CM | POA: Diagnosis not present

## 2023-04-17 ENCOUNTER — Ambulatory Visit (HOSPITAL_COMMUNITY)
Admission: EM | Admit: 2023-04-17 | Discharge: 2023-04-17 | Disposition: A | Discharge status: Other Acute Inpatient Hospital | Payer: BC Managed Care – PPO | Attending: Psychiatry | Admitting: Psychiatry

## 2023-04-17 ENCOUNTER — Inpatient Hospital Stay (HOSPITAL_COMMUNITY)
Admission: AD | Admit: 2023-04-17 | Discharge: 2023-04-22 | DRG: 885 | Disposition: A | Discharge status: Home / Self Care | Payer: BC Managed Care – PPO | Source: Intra-hospital | Attending: Psychiatry | Admitting: Psychiatry

## 2023-04-17 ENCOUNTER — Other Ambulatory Visit: Payer: Self-pay

## 2023-04-17 ENCOUNTER — Encounter (HOSPITAL_COMMUNITY): Payer: Self-pay | Admitting: Psychiatry

## 2023-04-17 DIAGNOSIS — Z720 Tobacco use: Secondary | ICD-10-CM

## 2023-04-17 DIAGNOSIS — F410 Panic disorder [episodic paroxysmal anxiety] without agoraphobia: Secondary | ICD-10-CM | POA: Diagnosis not present

## 2023-04-17 DIAGNOSIS — R4587 Impulsiveness: Secondary | ICD-10-CM | POA: Diagnosis present

## 2023-04-17 DIAGNOSIS — R41843 Psychomotor deficit: Secondary | ICD-10-CM | POA: Diagnosis present

## 2023-04-17 DIAGNOSIS — F429 Obsessive-compulsive disorder, unspecified: Secondary | ICD-10-CM | POA: Diagnosis present

## 2023-04-17 DIAGNOSIS — E119 Type 2 diabetes mellitus without complications: Secondary | ICD-10-CM | POA: Diagnosis present

## 2023-04-17 DIAGNOSIS — F419 Anxiety disorder, unspecified: Secondary | ICD-10-CM | POA: Diagnosis not present

## 2023-04-17 DIAGNOSIS — F332 Major depressive disorder, recurrent severe without psychotic features: Principal | ICD-10-CM | POA: Insufficient documentation

## 2023-04-17 DIAGNOSIS — J45909 Unspecified asthma, uncomplicated: Secondary | ICD-10-CM | POA: Diagnosis present

## 2023-04-17 DIAGNOSIS — R45851 Suicidal ideations: Secondary | ICD-10-CM | POA: Diagnosis not present

## 2023-04-17 DIAGNOSIS — Z79899 Other long term (current) drug therapy: Secondary | ICD-10-CM | POA: Insufficient documentation

## 2023-04-17 DIAGNOSIS — F4310 Post-traumatic stress disorder, unspecified: Secondary | ICD-10-CM | POA: Diagnosis not present

## 2023-04-17 LAB — URINALYSIS, COMPLETE (UACMP) WITH MICROSCOPIC
Bilirubin Urine: NEGATIVE
Glucose, UA: NEGATIVE mg/dL
Ketones, ur: NEGATIVE mg/dL
Leukocytes,Ua: NEGATIVE
Nitrite: NEGATIVE
Protein, ur: NEGATIVE mg/dL
Specific Gravity, Urine: 1.015 (ref 1.005–1.030)
pH: 6 (ref 5.0–8.0)

## 2023-04-17 LAB — LIPID PANEL
Cholesterol: 183 mg/dL (ref 0–200)
HDL: 43 mg/dL (ref >40)
LDL Cholesterol: 122 mg/dL — ABNORMAL HIGH (ref 0–99)
Total CHOL/HDL Ratio: 4.3 RATIO
Triglycerides: 90 mg/dL (ref <150)
VLDL: 18 mg/dL (ref 0–40)

## 2023-04-17 LAB — COMPREHENSIVE METABOLIC PANEL
ALT: 45 U/L — ABNORMAL HIGH (ref 0–44)
AST: 29 U/L (ref 15–41)
Albumin: 4.3 g/dL (ref 3.5–5.0)
Alkaline Phosphatase: 58 U/L (ref 38–126)
Anion gap: 13 (ref 5–15)
BUN: 9 mg/dL (ref 6–20)
CO2: 18 mmol/L — ABNORMAL LOW (ref 22–32)
Calcium: 9.6 mg/dL (ref 8.9–10.3)
Chloride: 106 mmol/L (ref 98–111)
Creatinine, Ser: 0.82 mg/dL (ref 0.44–1.00)
GFR, Estimated: >60 mL/min (ref >60)
Glucose, Bld: 77 mg/dL (ref 70–99)
Potassium: 4.4 mmol/L (ref 3.5–5.1)
Sodium: 137 mmol/L (ref 135–145)
Total Bilirubin: 0.7 mg/dL (ref 0.3–1.2)
Total Protein: 7.2 g/dL (ref 6.5–8.1)

## 2023-04-17 LAB — MAGNESIUM: Magnesium: 2.1 mg/dL (ref 1.7–2.4)

## 2023-04-17 LAB — TSH: TSH: 1.836 u[IU]/mL (ref 0.350–4.500)

## 2023-04-17 LAB — ETHANOL: Alcohol, Ethyl (B): <10 mg/dL (ref <10)

## 2023-04-17 MED ORDER — ACETAMINOPHEN 500 MG PO TABS
1000.0000 mg | ORAL_TABLET | Freq: Four times a day (QID) | ORAL | Status: DC | PRN
  Administered 2023-04-20: 1000 mg via ORAL
  Filled 2023-04-17: qty 2

## 2023-04-17 MED ORDER — HYDROXYZINE HCL 25 MG PO TABS: 50.0000 mg | ORAL_TABLET | Freq: Three times a day (TID) | ORAL | Status: DC | PRN

## 2023-04-17 MED ORDER — HYDROXYZINE HCL 25 MG PO TABS: 25.0000 mg | ORAL_TABLET | Freq: Three times a day (TID) | ORAL | Status: DC | PRN

## 2023-04-17 MED ORDER — HYDROXYZINE HCL 25 MG PO TABS
25.0000 mg | ORAL_TABLET | Freq: Three times a day (TID) | ORAL | Status: DC | PRN
  Administered 2023-04-19 – 2023-04-21 (×2): 25 mg via ORAL
  Filled 2023-04-17 (×4): qty 1

## 2023-04-17 MED ORDER — HALOPERIDOL 5 MG PO TABS: 5.0000 mg | ORAL_TABLET | Freq: Three times a day (TID) | ORAL | Status: DC | PRN

## 2023-04-17 MED ORDER — LORAZEPAM 2 MG/ML IJ SOLN: 2.0000 mg | Freq: Three times a day (TID) | INTRAMUSCULAR | Status: DC | PRN

## 2023-04-17 MED ORDER — ALPRAZOLAM 0.5 MG PO TABS
1.0000 mg | ORAL_TABLET | Freq: Every day | ORAL | Status: DC | PRN
  Administered 2023-04-18: 1 mg via ORAL
  Filled 2023-04-17: qty 2

## 2023-04-17 MED ORDER — ACETAMINOPHEN 500 MG PO TABS: 1000.0000 mg | ORAL_TABLET | Freq: Four times a day (QID) | ORAL | Status: DC | PRN

## 2023-04-17 MED ORDER — CARIPRAZINE HCL 3 MG PO CAPS
3.0000 mg | ORAL_CAPSULE | Freq: Every day | ORAL | Status: DC
  Administered 2023-04-18: 3 mg via ORAL
  Filled 2023-04-17 (×3): qty 1

## 2023-04-17 MED ORDER — LORAZEPAM 1 MG PO TABS: 2.0000 mg | ORAL_TABLET | Freq: Three times a day (TID) | ORAL | Status: DC | PRN

## 2023-04-17 MED ORDER — ALUM & MAG HYDROXIDE-SIMETH 200-200-20 MG/5ML PO SUSP: 30.0000 mL | ORAL | Status: DC | PRN

## 2023-04-17 MED ORDER — HALOPERIDOL LACTATE 5 MG/ML IJ SOLN: 5.0000 mg | Freq: Three times a day (TID) | INTRAMUSCULAR | Status: DC | PRN

## 2023-04-17 MED ORDER — CARIPRAZINE HCL 3 MG PO CAPS: 3.0000 mg | ORAL_CAPSULE | Freq: Every day | ORAL | Status: DC

## 2023-04-17 MED ORDER — DIPHENHYDRAMINE HCL 50 MG/ML IJ SOLN: 50.0000 mg | Freq: Three times a day (TID) | INTRAMUSCULAR | Status: DC | PRN

## 2023-04-17 MED ORDER — BUSPIRONE HCL 15 MG PO TABS
15.0000 mg | ORAL_TABLET | Freq: Two times a day (BID) | ORAL | Status: DC
  Administered 2023-04-17: 15 mg via ORAL
  Filled 2023-04-17: qty 1

## 2023-04-17 MED ORDER — ALPRAZOLAM 0.5 MG PO TABS: 1.0000 mg | ORAL_TABLET | Freq: Every day | ORAL | Status: DC | PRN

## 2023-04-17 MED ORDER — MAGNESIUM HYDROXIDE 400 MG/5ML PO SUSP: 30.0000 mL | Freq: Every day | ORAL | Status: DC | PRN

## 2023-04-17 MED ORDER — FLUOXETINE HCL 20 MG PO CAPS: 40.0000 mg | ORAL_CAPSULE | Freq: Every morning | ORAL | Status: DC

## 2023-04-17 MED ORDER — DIPHENHYDRAMINE HCL 25 MG PO CAPS: 50.0000 mg | ORAL_CAPSULE | Freq: Three times a day (TID) | ORAL | Status: DC | PRN

## 2023-04-17 MED ORDER — BUSPIRONE HCL 15 MG PO TABS
15.0000 mg | ORAL_TABLET | Freq: Two times a day (BID) | ORAL | Status: DC
  Administered 2023-04-18 – 2023-04-19 (×3): 15 mg via ORAL
  Filled 2023-04-17 (×7): qty 1

## 2023-04-17 MED ORDER — TRAZODONE HCL 50 MG PO TABS: 50.0000 mg | ORAL_TABLET | Freq: Every evening | ORAL | Status: DC | PRN

## 2023-04-17 MED ORDER — TRAZODONE HCL 50 MG PO TABS
50.0000 mg | ORAL_TABLET | Freq: Every evening | ORAL | Status: DC | PRN
  Filled 2023-04-17: qty 1

## 2023-04-17 MED ORDER — ACETAMINOPHEN 325 MG PO TABS: 650.0000 mg | ORAL_TABLET | Freq: Four times a day (QID) | ORAL | Status: DC | PRN

## 2023-04-17 MED ORDER — FLUOXETINE HCL 20 MG PO CAPS
40.0000 mg | ORAL_CAPSULE | Freq: Every morning | ORAL | Status: DC
  Administered 2023-04-18: 40 mg via ORAL
  Filled 2023-04-17 (×3): qty 2

## 2023-04-17 NOTE — Progress Notes (Signed)
Pt was accepted to [facility] TODAY 04/17/2023. Bed assignment: 405-2  Pt meets inpatient criteria per Vernard Gambles, NP  Attending Physician will be Nadir Abbott Pao, MD  Report can be called to: - Adult unit: [id number]  Pt can arrive after 4 PM  Care Team Notified: [facility] [place], RN, Rex Kras, RN, Isaiah Serge, LPN, Harless Litten, RN, and Vernard Gambles, NP  [place], [place]  04/17/2023 2:23 PM

## 2023-04-17 NOTE — ED Notes (Signed)
Report was called to nurse Ethelene Browns at [facility] and update given on Labs. [facility] called. Itha continues to rest in her chair bed.

## 2023-04-17 NOTE — [facility] Group Notes (Signed)
Pt did not attend AA group  

## 2023-04-17 NOTE — Progress Notes (Signed)
   04/17/23 1157  [facility] Triage Screening (Walk-ins at [facility] only)  How Did You Hear About [place]? Other (Comment) (psych)  What Is the Reason for Your Visit/Call Today? Angela Bryan is a 32 year old female presenting to [facility] voluntarily with chief complaint of worsening depression and suicidal thoughts with a plan to overdose on her medications.  How Long Has This Been Causing You Problems? 1 wk - 1 month  Have You Recently Had Any Thoughts About Hurting Yourself? Yes  How long ago did you have thoughts about hurting yourself? a week  Are You Planning to Commit Suicide/Harm Yourself At This time? Yes  Have you Recently Had Thoughts About Hurting Someone Karolee Ohs? Yes  How long ago did you have thoughts of harming others? Friday  Are You Planning To Harm Someone At This Time? No  Are you currently experiencing any auditory, visual or other hallucinations? No  Have You Used Any Alcohol or Drugs in the Past 24 Hours? No  Do you have any current medical co-morbidities that require immediate attention? No  Clinician description of patient physical appearance/behavior: calm  What Do You Feel Would Help You the Most Today? Treatment for Depression or other mood problem  If access to [facility] was not available, would you have sought care in the Emergency Department? No  Determination of Need Urgent (48 hours)  Options For Referral Outpatient Therapy;Inpatient Hospitalization

## 2023-04-17 NOTE — Progress Notes (Signed)
Pt presents voluntarily from [facility] due to concerns for worsening depression, SI, and "intrusive thoughts to harm her 32 year old son." Pt anxious, cooperative on assessment. Pt reports that primary trigger for SI was the death of her dog a few weeks ago. Pt also reports transitioning into a stay at home role approximately 2 years ago. Pt was a Runner, broadcasting/film/video prior, and told RN that being at home has been an adjustment for her. Pt is able to identify a large support system, consisting of parents, brothers, aunt, and boyfriend of 7 years. Pt denies any past inpatient hospitalizations, but was previously on psychotropic medications. Pt's goal for admission is to find new coping skills to help with stress management. Pt able to join the milieu with ease following admission process.

## 2023-04-17 NOTE — ED Provider Notes (Signed)
Behavioral Health Urgent Care Medical Screening Exam  Patient Name: Angela Bryan MRN: [id number] Date of Evaluation: 04/17/23 Chief Complaint:  Increased depression with suicidal thoughts with a plan to overdose on medications. Diagnosis:  Final diagnoses:  Severe episode of recurrent major depressive disorder, without psychotic features    History of Present illness: Angela Bryan is a 32 y.o. female patient presented to [facility] as a walk in  accompanied by her mother with complaints of increased depression and suicidal thoughts with a plan to overdose on medications.  She was referred to [facility] at the recommendation of her psychiatrist Angela Bryan at [facility].  Angela Bryan, 32 y.o., female patient seen face to face by this provider and chart reviewed on 04/17/23.  Patient receives medication management at [facility] and her next appointment is scheduled for 05/01/23 at 1:40pm. Patient is not receiving outpatient therapy. Patient denies history of suicide attempts and she has never been admitted to psychiatric inpatient treatment. Patient denies legal issues and does not have access to a firearm.   Patient presented with paperwork from psychiatric provider's office.  Per paperwork, "Angela Bryan was treated in our office today for emergent appointment due to unstable depression with active suicidal ideations. Medication was adjusted last week w/o effect or reduction of suicidal/severe depressive symptoms. Pt is with family and is willing to be evaluated and admitted t treatment". Per note patient current medications include buspirone  BID, Xanax , Vraylar 1.5mg  QD -increased  last week, fluoxetine, , QD and promethazine . Patient current diagnosis is MDD, recurrent severe, panic disorder and anxiety, unspecified "  Per [facility] by Angela Bryan on 04/17/2023 "Patient reports having a "hard time" for the past week and today she  scheduled an emergency appointment with her psychiatrist because she was thinking about overdosing on her medications. Patient reports she had these thoughts for the past week and has been "battling and fighting" the thoughts. Patient reports that her dog died a couple weeks ago which she reports triggered her depression and sadness. Patient also thinks that her medication "is out of whack" and states that her psychiatrist tried to adjust her medications, but she feels it could have made things worse. Patient denies other triggers to depression and SI however reports she has a 32-year-old son and she is a stay at home mom. Patient reports she has not been able to stay at home alone due to her thoughts being so bad, so her aunt and mother is staying at the house with her. Patient reports having intrusive thoughts about hurting her son. Patient reports last Friday she was putting her son to sleep, and she had an intrusive thought that she should suffocate him with a pillow. Patient reports her aunt was at the house with her, so she took her son to her aunt and told her that she was having these thoughts. Patient reports her aunt and mother are aware of these thoughts and she states that she will never hurt her son. Patient becomes tearful and states that she feels hopeless and down, like she is a bad mom for having these thoughts".   On evaluation Angela Bryan observed sitting in the assessment room in no acute distress.  She is alert/oriented x 4, cooperative, and attentive.  She is dressed casually and makes good eye contact.  She has normal speech and behavior.  Reports over the past 2 weeks she has noticed an increase in her depression, anxiety, and  suicidal ideations.  Reports around that time her pet died.  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  Over the past week her suicidal thoughts have become so intense that she has been researching on how much of her medications she would need to take to  kill herself.  She continues to endorse SI and cannot contract for safety.  She endorses depressive symptoms of feeling helpless, hopeless, tearfulness, decreased appetite and sleep.  She has a depressed affect she denies any current HI.  She denies AVH. Objectively there is no evidence of psychosis/mania or delusional thinking.  Patient is able to converse coherently, goal directed thoughts, no distractibility, or pre-occupation.    Patient answered question appropriately.    Discussed inpatient psychiatric admission with patient.  Explained the milieu and expectations.  Patient is in agreement.  Flowsheet Row ED from 04/17/2023 in [facility]  C-SSRS RISK CATEGORY High Risk       Psychiatric Specialty Exam  Presentation  General Appearance:Appropriate for Environment; Casual  Eye Contact:Good  Speech:Clear and Coherent; Normal Rate  Speech Volume:Normal  Handedness:Right   Mood and Affect  Mood: Anxious; Depressed; Worthless  Affect: Solicitor Processes: Coherent  Descriptions of Associations:Intact  Orientation:Full (Time, Place and Person)  Thought Content:Logical  Diagnosis of Schizophrenia or Schizoaffective disorder in past: No   Hallucinations:No data recorded Ideas of Reference:None  Suicidal Thoughts:Yes, Active With Intent; With Plan; With Means to Carry Out  Homicidal Thoughts:No   Sensorium  Memory: Immediate Good; Remote Good; Recent Good  Judgment: Fair  Insight: Fair   Executive Functions  Concentration: Good  Attention Span: Good  Recall: Good  Fund of Knowledge: Good  Language: Good   Psychomotor Activity  Psychomotor Activity: Normal   Assets  Assets: Communication Skills; Desire for Improvement; Financial Resources/Insurance; Housing; Intimacy; Leisure Time; Physical Health; Resilience; Social Support   Sleep  Sleep: Fair  Number of hours: No data  recorded  Physical Exam: Physical Exam Vitals and nursing note reviewed.  Constitutional:      General: She is not in acute distress.    Appearance: Normal appearance. She is not ill-appearing.  Eyes:     General:        Right eye: No discharge.        Left eye: No discharge.  Cardiovascular:     Rate and Rhythm: Normal rate.  Pulmonary:     Effort: Pulmonary effort is normal.  Musculoskeletal:        General: Normal range of motion.     Cervical back: Normal range of motion.  Skin:    Coloration: Skin is not jaundiced or pale.  Neurological:     Mental Status: She is alert and oriented to person, place, and time.  Psychiatric:        Attention and Perception: Attention and perception normal.        Mood and Affect: Affect normal. Mood is anxious and depressed.        Speech: Speech normal.        Behavior: Behavior is cooperative.        Thought Content: Thought content includes suicidal ideation. Thought content includes suicidal plan.        Cognition and Memory: Cognition normal.        Judgment: Judgment normal.    Review of Systems  Constitutional: Negative.   HENT: Negative.    Eyes: Negative.   Respiratory: Negative.    Cardiovascular: Negative.  Genitourinary: Negative.   Musculoskeletal: Negative.   Skin: Negative.   Neurological: Negative.   Psychiatric/Behavioral:  Positive for depression and suicidal ideas. The patient is nervous/anxious.    Blood pressure 134/89, pulse 87, temperature 98.2 F (36.8 C), temperature source Oral, resp. rate 18, SpO2 99 %, unknown if currently breastfeeding. There is no height or weight on file to calculate BMI.  Musculoskeletal: Strength & Muscle Tone: within normal limits Gait & Station: normal Patient leans: N/A   [facility] MSE Discharge Disposition for Follow up and Recommendations: Based on my evaluation I certify that psychiatric inpatient services furnished can reasonably be expected to improve the patient's  condition which I recommend transfer to an appropriate accepting facility.   Patient meets the criteria for inpatient psychiatric admission.  [facility] notified and patient has been accepted.  Dr. Sudie Bailey is the accepting MD.  Patient can arrive after 4 PM   Medications   alum & mag hydroxide-simeth (MAALOX/MYLANTA) 200-200-20 MG/5ML suspension 30 mL   magnesium hydroxide (MILK OF MAGNESIA) suspension 30 mL   hydrOXYzine (ATARAX) tablet 25 mg TID PRN   traZODone (DESYREL) tablet 50 mg QHS PRN    FLUoxetine (PROZAC) capsule 40 mg QD   cariprazine (VRAYLAR) capsule 3 mg QD   ALPRAZolam (XANAX) tablet 1 mg QD PRN   acetaminophen (TYLENOL) tablet 1,000 mg Q6H PRN   busPIRone (BUSPAR) tablet 15 mg  BID     Lab Orders         CBC with Differential/Platelet         Comprehensive metabolic panel         Hemoglobin A1c         Magnesium         Ethanol         Lipid panel         TSH         RPR         Urinalysis, Complete w Microscopic -Urine, Clean Catch         POC urine preg, ED         POCT Urine Drug Screen - (I-Screen)     EKG   Ardis Hughs, NP 04/17/2023, 1:39 PM

## 2023-04-17 NOTE — Discharge Instructions (Addendum)
Transfer to [facility] for Inpatient admission, Dr. Abbott Pao is accepting MD

## 2023-04-17 NOTE — BH Assessment (Signed)
Comprehensive Clinical Assessment (CCA) Note  04/17/2023 Angela Bryan [id number]  Disposition: Per Vernard Gambles, NP, patient is recommended for inpatient treatment.  The patient demonstrates the following risk factors for suicide: Chronic risk factors for suicide include: psychiatric disorder of MDD, anxiety and panic disorder . Acute risk factors for suicide include: loss (financial, interpersonal, professional). Protective factors for this patient include: positive social support, positive therapeutic relationship, responsibility to others (children, family), and hope for the future. Considering these factors, the overall suicide risk at this point appears to be high. Patient is not appropriate for outpatient follow up.  Angela Bryan is a 32 year old female presenting to [facility] voluntarily with chief complaint of worsening depression and suicidal thoughts with a plan to overdose on her medications. Patient was sent here by her psychiatrist due to SI. Per psychiatrist "Angela Bryan was treated in our office today for emergent appointment due to unstable depression with active suicidal ideations. Medication was adjusted last week w/o effect or reduction of suicidal/severe depressive symptoms. Pt is with family and is willing to be evaluated and admitted t treatment". Per note patient current medications include buspirone 15mg  BID, Xanax 1mg , Vraylar 1.5mg  QD -increased 3mg  last week, fluoxetine, 40mg , QD and promethazine 25mg . Patient current diagnosis is MDD, recurrent severe, panic disorder and anxiety, unspecified.  Patient reports having a "hard time" for the past week and today she scheduled an emergency appointment with her psychiatrist because she was thinking about overdosing on her medications. Patient reports she had these thoughts for the past week and has been "battling and fighting" the thoughts. Patient reports that her dog died a couple weeks ago which she reports triggered her  depression and sadness. Patient also thinks that her medication "is out of whack" and states that her psychiatrist tried to adjust her medications, but she feels it could have made things worse. Patient denies other triggers to depression and SI however reports she has a 62-year-old son and she is a stay at home mom. Patient reports she has not been able to stay at home alone due to her thoughts being so bad, so her aunt and mother is staying at the house with her. Patient reports having intrusive thoughts about hurting her son. Patient reports last Friday she was putting her son to sleep, and she had an intrusive thought that she should suffocate him with a pillow. Patient reports her aunt was at the house with her, so she took her son to her aunt and told her that she was having these thoughts. Patient reports her aunt and mother are aware of these thoughts and she states that she will never hurt her son. Patient becomes tearful and states that she feels hopeless and down, like she is a bad mom for having these thoughts.   Patient receives medication management at [facility] and her next appointment is scheduled for 05/01/23 at 1:40pm. Patient is not receiving outpatient therapy. Patient denies history of suicide attempts and she has never been admitted to psychiatric inpatient treatment. Patient denies legal issues and does not have access to a firearm.  Patient is oriented x4, engaged, alert and cooperative during assessment. Patient eye contact and speech is normal, her affect is depressed with congruent mood. Patient endorses suicidal ideations with plan to overdose on medication and she does not contract for safety. Patient reports she was researching online how much medications she needs to take to kill herself. Patient denies HI, AVH, alcohol and drug use.    Chief  Complaint:  Chief Complaint  Patient presents with   Depression   Suicidal   Visit Diagnosis: MDD, recurrent, severe, without  psychotic features Suicidal Ideations    CCA Screening, Triage and Referral (STR)  Patient Reported Information How did you hear about [place]? Other (Comment) (psych)  What Is the Reason for Your Visit/Call Today? Angela Bryan is a 32 year old female presenting to [facility] voluntarily with chief complaint of worsening depression and suicidal thoughts with a plan to overdose on her medications.  How Long Has This Been Causing You Problems? 1 wk - 1 month  What Do You Feel Would Help You the Most Today? Treatment for Depression or other mood problem   Have You Recently Had Any Thoughts About Hurting Yourself? Yes  Are You Planning to Commit Suicide/Harm Yourself At This time? Yes   Flowsheet Row ED from 04/17/2023 in [facility]  C-SSRS RISK CATEGORY High Risk       Have you Recently Had Thoughts About Hurting Someone Karolee Ohs? Yes  Are You Planning to Harm Someone at This Time? No  Explanation: On Friday patient reports having intrusive thoughts about suffocating her two year old with a pillow while trying to put him to sleep.   Have You Used Any Alcohol or Drugs in the Past 24 Hours? No  What Did You Use and How Much? NA   Do You Currently Have a Therapist/Psychiatrist? Yes  Name of Therapist/Psychiatrist: Name of Therapist/Psychiatrist: [facility]   Have You Been Recently Discharged From Any Office Practice or Programs? No  Explanation of Discharge From Practice/Program: NA     CCA Screening Triage Referral Assessment Type of Contact: Face-to-Face  Telemedicine Service Delivery:   Is this Initial or Reassessment?   Date Telepsych consult ordered in [facility]:    Time Telepsych consult ordered in [facility]:    Location of Assessment: [facility]  Provider Location: [facility]   Collateral Involvement: NONE   Does Patient Have a Automotive engineer Guardian? No  Legal Guardian  Contact Information: NA  Copy of Legal Guardianship Form: -- (NA)  Legal Guardian Notified of Arrival: -- (NA)  Legal Guardian Notified of Pending Discharge: -- (NA)  If Minor and Not Living with Parent(s), Who has Custody? NA  Is CPS involved or ever been involved? Never  Is APS involved or ever been involved? Never   Patient Determined To Be At Risk for Harm To Self or Others Based on Review of Patient Reported Information or Presenting Complaint? Yes, for Self-Harm  Method: No Plan  Availability of Means: No access or NA  Intent: Vague intent or NA  Notification Required: No need or identified person  Additional Information for Danger to Others Potential: No data recorded Additional Comments for Danger to Others Potential: NA  Are There Guns or Other Weapons in Your Home? No  Types of Guns/Weapons: NA  Are These Weapons Safely Secured?                            -- (NA)  Who Could Verify You Are Able To Have These Secured: MOTHER  Do You Have any Outstanding Charges, Pending Court Dates, Parole/Probation? NO  Contacted To Inform of Risk of Harm To Self or Others: Family/Significant Other:    Does Patient Present under Involuntary Commitment? No    [place] of Residence: [place]   Patient Currently Receiving the Following Services:  Medication Management   Determination of Need: Urgent (48 hours)   Options For Referral: Outpatient Therapy; Inpatient Hospitalization     CCA Biopsychosocial Patient Reported Schizophrenia/Schizoaffective Diagnosis in Past: No   Strengths: No data recorded  Mental Health Symptoms Depression:   Change in energy/activity; Difficulty Concentrating; Hopelessness; Sleep (too much or little); Tearfulness; Worthlessness   Duration of Depressive symptoms:  Duration of Depressive Symptoms: Greater than two weeks   Mania:   None   Anxiety:    Worrying; Tension; Difficulty concentrating   Psychosis:   None   Duration  of Psychotic symptoms:    Trauma:   None   Obsessions:   None   Compulsions:   None   Inattention:   None   Hyperactivity/Impulsivity:   None   Oppositional/Defiant Behaviors:   None   Emotional Irregularity:   None   Other Mood/Personality Symptoms:  No data recorded   Mental Status Exam Appearance and self-care  Stature:   Average   Weight:   Average weight   Clothing:   Neat/clean; Age-appropriate   Grooming:   Normal   Cosmetic use:   None   Posture/gait:   Normal   Motor activity:   Not Remarkable   Sensorium  Attention:   Normal   Concentration:   Normal   Orientation:   Person; Place; Situation   Recall/memory:   Normal   Affect and Mood  Affect:   Depressed; Tearful   Mood:   Depressed   Relating  Eye contact:   Normal   Facial expression:   Depressed   Attitude toward examiner:   Cooperative   Thought and Language  Speech flow:  Clear and Coherent   Thought content:   Appropriate to Mood and Circumstances   Preoccupation:   Suicide   Hallucinations:   None   Organization:   Intact   Company secretary of Knowledge:   Fair   Intelligence:   Average   Abstraction:   Normal   Judgement:   Good   Reality Testing:   Adequate   Insight:   Fair   Decision Making:   Normal   Social Functioning  Social Maturity:   Responsible   Social Judgement:   Normal   Stress  Stressors:   Grief/losses   Coping Ability:   Overwhelmed; Exhausted   Skill Deficits:   None   Supports:   Family; Friends/Service system     Religion: Religion/Spirituality Are You A Religious Person?: Yes What is Your Religious Affiliation?: Christian How Might This Affect Treatment?: NA  Leisure/Recreation: Leisure / Recreation Do You Have Hobbies?: Yes Leisure and Hobbies: reading  and "outdoors stuff"  Exercise/Diet: Exercise/Diet Do You Exercise?: Yes What Type of Exercise Do You Do?:  Run/Walk How Many Times a Week Do You Exercise?: 6-7 times a week Have You Gained or Lost A Significant Amount of Weight in the Past Six Months?: No Do You Follow a Special Diet?: No Do You Have Any Trouble Sleeping?: Yes Explanation of Sleeping Difficulties: Hard times falling to sleep and staying asleep   CCA Employment/Education Employment/Work Situation: Employment / Work Situation Employment Situation: Unemployed (STAY AT HOME MOM) Patient's Job has Been Impacted by Current Illness: No Has Patient ever Been in the [employer]?: No  Education: Education Is Patient Currently Attending School?: No Last Grade Completed:  (BA) Did You Attend College?: No Did You Have An Individualized Education Program (IIEP): No Did You Have Any Difficulty At School?: No Patient's  Education Has Been Impacted by Current Illness: No   CCA Family/Childhood History Family and Relationship History: Family history Marital status: Long term relationship (7 years) Number of Years Married:  (NA) Long term relationship, how long?: none What types of issues is patient dealing with in the relationship?: none Additional relationship information: none Does patient have children?: Yes How many children?: 1 How is patient's relationship with their children?: 21 years old  Childhood History:  Childhood History By whom was/is the patient raised?: Both parents Did patient suffer any verbal/emotional/physical/sexual abuse as a child?: No Did patient suffer from severe childhood neglect?: No Has patient ever been sexually abused/assaulted/raped as an adolescent or adult?: No Was the patient ever a victim of a crime or a disaster?: No Witnessed domestic violence?: No Has patient been affected by domestic violence as an adult?: No       CCA Substance Use Alcohol/Drug Use: Alcohol / Drug Use Pain Medications: See MAR Prescriptions: See MAR Over the Counter: See MAR History of alcohol / drug use?: No  history of alcohol / drug abuse Longest period of sobriety (when/how long): NA Negative Consequences of Use:  (NA) Withdrawal Symptoms: None                         ASAM's:  Six Dimensions of Multidimensional Assessment  Dimension 1:  Acute Intoxication and/or Withdrawal Potential:      Dimension 2:  Biomedical Conditions and Complications:      Dimension 3:  Emotional, Behavioral, or Cognitive Conditions and Complications:     Dimension 4:  Readiness to Change:     Dimension 5:  Relapse, Continued use, or Continued Problem Potential:     Dimension 6:  Recovery/Living Environment:     ASAM Severity Score:    ASAM Recommended Level of Treatment: ASAM Recommended Level of Treatment:  (NA)   Substance use Disorder (SUD) Substance Use Disorder (SUD)  Checklist Symptoms of Substance Use:  (NA)  Recommendations for Services/Supports/Treatments: Recommendations for Services/Supports/Treatments Recommendations For Services/Supports/Treatments: Individual Therapy  Discharge Disposition: Discharge Disposition Medical Exam completed: Yes Disposition of Patient: Admit Mode of transportation if patient is discharged/movement?: Car  DSM5 Diagnoses: Patient Active Problem List   Diagnosis Date Noted   Viral illness 02/16/2023   Pre-eclampsia during pregnancy in third trimester, antepartum 09/19/2020   Gestational diabetes, diet controlled 09/19/2020   Supervision of high risk pregnancy, antepartum, third trimester 02/03/2020   GERD (gastroesophageal reflux disease) 05/14/2018   Generalized anxiety disorder 04/05/2018     Referrals to Alternative Service(s): Referred to Alternative Service(s):   Place:   Date:   Time:    Referred to Alternative Service(s):   Place:   Date:   Time:    Referred to Alternative Service(s):   Place:   Date:   Time:    Referred to Alternative Service(s):   Place:   Date:   Time:     Audree Camel, [facility]

## 2023-04-17 NOTE — Progress Notes (Signed)
Received Angela Bryan in the assessment room with her mom and the MHT. She was cooperative with the admission process and relocated to the unit. She was offered nourishments, but declined. She is aware of her impending transfer to [facility] and signed the consent form. She endorsed feeling less depressed, anxious and verbalized she does not feel suicidal at this time. She is in a relaxed position on her chair bed.

## 2023-04-18 DIAGNOSIS — F332 Major depressive disorder, recurrent severe without psychotic features: Secondary | ICD-10-CM | POA: Diagnosis not present

## 2023-04-18 LAB — RPR: RPR Ser Ql: NONREACTIVE

## 2023-04-18 NOTE — Progress Notes (Signed)
Patient appears depressed. Patient denies SI/HI/AVH. Pt reports anxiety is 8/10 and depression is 10/10. Pt reports "not great" sleep and good appetite. Patient complied with morning medication with no reported side effects. Patient remains safe on Q41min checks and contracts for safety.      04/18/23 0952  Psych Admission Type (Psych Patients Only)  Admission Status Voluntary  Psychosocial Assessment  Patient Complaints Anxiety;Depression;Sleep disturbance  Eye Contact Fair  Facial Expression Anxious;Sad  Affect Anxious;Depressed  Speech Logical/coherent  Interaction Cautious  Motor Activity Slow  Appearance/Hygiene Unremarkable  Behavior Characteristics Cooperative;Anxious  Mood Anxious;Depressed  Thought Process  Coherency WDL  Content WDL  Delusions None reported or observed  Perception WDL  Hallucination None reported or observed  Judgment Poor  Confusion None  Danger to Self  Current suicidal ideation? Denies  Agreement Not to Harm Self Yes  Description of Agreement verbal  Danger to Others  Danger to Others None reported or observed

## 2023-04-18 NOTE — Group Note (Signed)
Recreation Therapy Group Note   Group Topic:Animal Assisted Therapy   Group Date: 04/18/2023 Start Time: 1610 End Time: 1030 Facilitators: Rayland Hamed-McCall, LRT,CTRS Location: [address] Dayroom   Animal-Assisted Activity (AAA) Program Checklist/Progress Notes Patient Eligibility Criteria Checklist & Daily Group note for Rec Tx Intervention  AAA/T Program Assumption of Risk Form signed by Patient/ or Parent Legal Guardian Yes  Patient understands his/her participation is voluntary Yes   Affect/Mood: N/A   Participation Level: Did not attend    Clinical Observations/Individualized Feedback:     Plan: Continue to engage patient in RT group sessions 2-3x/week.   Angela Bryan, LRT,CTRS 04/18/2023 1:49 PM

## 2023-04-18 NOTE — Group Note (Signed)
LCSW Group Therapy Note   Group Date: 04/18/2023 Start Time: 1100 End Time: 1200   Type of Therapy and Topic:  Group Therapy: Goal Exploration   Participation Level:  Active  Description of Group: In this process group, patients discussed using strengths to work toward goals and address challenges.  Patients identified two positive things about themselves and one goal they were working on.  Patients were given the opportunity to share openly and support each other's plan for self-empowerment.  The group discussed the value of gratitude and were encouraged to have a daily reflection of positive characteristics or circumstances.  Patients were encouraged to identify a plan to utilize their strengths to work on current challenges and goals.   Therapeutic Goals Patient will verbalize personal strengths/positive qualities and relate how these can assist with achieving desired personal goals Patients will verbalize affirmation of peers plans for personal change and goal setting Patients will explore the value of gratitude and positive focus as related to successful achievement of goals Patients will verbalize a plan for regular reinforcement of personal positive qualities and circumstances.   Summary of Patient Progress: Patient identified the definition of goals. Patients was given the opportunity to share openly and support other group members' plan for self-empowerment. Patient verbalized personal strength and how they relate to achieving the desired goal. Patient was respectful towards her peers and remain in group the entire time     Therapeutic Modalities Cognitive Behavioral Therapy Motivational Interviewing    Isabella Bowens, LCSWA 04/18/2023  4:21 PM

## 2023-04-18 NOTE — [facility] Suicide Risk Assessment (Signed)
[facility] Admission Suicide Risk Assessment   Nursing information obtained from:    Demographic factors:  Unemployed, Caucasian Current Mental Status:  Suicidal ideation indicated by patient, Belief that plan would result in death Loss Factors:  Decrease in vocational status Historical Factors:  NA Risk Reduction Factors:  Responsible for children under 32 years of age, Positive social support, Living with another person, especially a relative  Total Time spent with patient: 30 minutes Principal Problem: MDD (major depressive disorder), recurrent episode, severe Diagnosis:  Principal Problem:   MDD (major depressive disorder), recurrent episode, severe  Subjective Data:   Identifying information : The patient is a 32 year old Caucasian female who was admitted to [facility].  Change on a voluntary status with symptoms of depression and increasing suicidal ideations with a plan to overdose on her medications. History of present illness: The patient reports that she sees his psychiatrist on a regular basis.  She has been placed on medications that were recently increased including the BuSpar and the Vraylar.She continues to have persistent suicidal ideations and depression.  She reports that she feels sad all the time and has been feeling this way for over 2-1/2 weeks.  She also started having significant persistent suicidal thoughts required she thought that she may not be in control.  She reports that she was having a hard time and so her psychiatrist on an emergency appointment when she started thinking of taking an overdose.  Medication adjustments did not help.  She did not identify any specific stressors that are triggering her depression.  She also had some intrusive thoughts of suffocating her some with a pillow.  She became quite tearful and felt helpless and hopeless. Past psychiatric history: The patient reports that she has been depressed off and on for the last 4 years.  She has had outpatient  counseling and treatment but has not had any inpatient treatment.  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  Husband works full-time and is fairly supportive.  Her parents are also quite supportive. Mental status examination: When seen today the patient was alert and oriented cooperative and maintained fair to good eye contact.  Her speech was coherent without any obvious looseness of associations or flight of ideas or tangentiality but with some latency.  She was quite tearful during interview and has significant psychomotor retardation.  She continues to feel helpless and hopeless and continues to endorse intrusive thoughts that are quite bothersome to her.  She continues to feel quite jittery and nervous also on the medications.  While in the hospital she is contracting for safety and denies any psychotic symptoms.  She denies any racing thoughts or delusions.  She is cognitively intact.  Continued Clinical Symptoms:    The "Alcohol Use Disorders Identification Test", Guidelines for Use in Primary Care, Second Edition.  World Science writer [facility]). Score between 0-7:  no or low risk or alcohol related problems. Score between 8-15:  moderate risk of alcohol related problems. Score between 16-19:  high risk of alcohol related problems. Score 20 or above:  warrants further diagnostic evaluation for alcohol dependence and treatment.   CLINICAL FACTORS:   Severe Anxiety and/or Agitation Depression:   Impulsivity   Musculoskeletal: Strength & Muscle Tone: within normal limits Gait & Station: normal Patient leans: N/A  Psychiatric Specialty Exam:  Presentation  General Appearance:  Appropriate for Environment; Casual  Eye Contact: Good  Speech: Clear and Coherent; Normal Rate  Speech  Volume: Normal  Handedness: Right   Mood and Affect  Mood: Anxious; Depressed; Worthless  Affect: Solicitor Processes: Coherent  Descriptions of Associations:Intact  Orientation:Full (Time, Place and Person)  Thought Content:Logical  History of Schizophrenia/Schizoaffective disorder:No  Duration of Psychotic Symptoms:No data recorded Hallucinations:No data recorded Ideas of Reference:None  Suicidal Thoughts:Suicidal Thoughts: Yes, Active SI Active Intent and/or Plan: With Intent; With Plan; With Means to Carry Out  Homicidal Thoughts:Homicidal Thoughts: No   Sensorium  Memory: Immediate Good; Remote Good; Recent Good  Judgment: Fair  Insight: Fair   Executive Functions  Concentration: Good  Attention Span: Good  Recall: Good  Fund of Knowledge: Good  Language: Good   Psychomotor Activity  Psychomotor Activity: Psychomotor Activity: Normal   Assets  Assets: Communication Skills; Desire for Improvement; Financial Resources/Insurance; Housing; Intimacy; Leisure Time; Physical Health; Resilience; Social Support   Sleep  Sleep: Sleep: Fair    Physical Exam: Physical Exam Vitals and nursing note reviewed.  Constitutional:      Appearance: Normal appearance.  Neurological:     General: No focal deficit present.     Mental Status: She is alert and oriented to person, place, and time. Mental status is at baseline.    Review of Systems  Psychiatric/Behavioral:  Positive for depression and suicidal ideas. The patient is nervous/anxious.    Blood pressure (!) 132/92, pulse 92, temperature 98 F (36.7 C), temperature source Oral, resp. rate 16, height  (1.575 m), weight 86.2 kg, SpO2 100 %, unknown if currently breastfeeding. Body mass index is 34.75 kg/m.   COGNITIVE FEATURES THAT CONTRIBUTE TO RISK:  Polarized thinking and Thought constriction (tunnel vision)    SUICIDE RISK:   Moderate:   Frequent suicidal ideation with limited intensity, and duration, some specificity in terms of plans, no associated intent, good self-control, limited dysphoria/symptomatology, some risk factors present, and identifiable protective factors, including available and accessible social support.  PLAN OF CARE: ASSESSMENT:  Diagnoses / Active Problems: Major depression, recurrent with suicidal ideations.  PLAN: Safety and Monitoring:  --  Voluntary admission to inpatient psychiatric unit for safety, stabilization and treatment  -- Daily contact with patient to assess and evaluate symptoms and progress in treatment  -- Patient's case to be discussed in multi-disciplinary team meeting  -- Observation Level : q15 minute checks  -- Vital signs:  q12 hours  -- Precautions: suicide, elopement, and assault  2. Psychiatric Diagnoses and Treatment:    --  The risks/benefits/side-effects/alternatives to this medication were discussed in detail with the patient and time was given for questions. The patient consents to medication trial.  -- FDA continue home medications until establishing contact with a psychiatrist and consider adjustments as indicated.  -- Metabolic profile and EKG monitoring obtained while on an atypical antipsychotic (BMI: Lipid Panel: HbgA1c: QTc:) as needed.  -- Encouraged patient to participate in unit milieu and in scheduled group therapies   -- [place] Term Goals: Ability to identify changes in lifestyle to reduce recurrence of condition will improve, Ability to verbalize feelings will improve, Ability to demonstrate self-control will improve, and Ability to identify triggers associated with substance abuse/mental health issues will improve  -- Long Term Goals: Improvement in symptoms so as ready for discharge    3. Medical Issues Being Addressed:   Tobacco Use Disorder  -- Nicotine patch  /24 hours ordered  -- Smoking cessation encouraged  4. Discharge Planning:   -- Social  work and case management to assist with discharge planning and identification of hospital follow-up needs prior to discharge  -- Estimated LOS: 5-7 days  -- Discharge Concerns: Need to establish a safety plan; Medication compliance and effectiveness  -- Discharge Goals: Return home with outpatient referrals for mental health follow-up including medication management/psychotherapy   I certify that inpatient services furnished can reasonably be expected to improve the patient's condition.   Rex Kras, MD 04/18/2023, 11:06 AM

## 2023-04-18 NOTE — H&P (Signed)
Psychiatric Admission Assessment Adult  Patient Identification: Angela Bryan MRN:  [id number] Date of Evaluation:  04/18/2023 Chief Complaint:  MDD (major depressive disorder), recurrent episode, severe [F33.2] Principal Diagnosis: MDD (major depressive disorder), recurrent episode, severe Diagnosis:  Principal Problem:   MDD (major depressive disorder), recurrent episode, severe  Identifying information : The patient is a 32 year old Caucasian female who was admitted to [facility].  Change on a voluntary status with symptoms of depression and increasing suicidal ideations with a plan to overdose on her medications. History of present illness: The patient reports that she sees his psychiatrist on a regular basis.  She has been placed on medications that were recently increased including the BuSpar and the Vraylar.She continues to have persistent suicidal ideations and depression.  She reports that she feels sad all the time and has been feeling this way for over 2-1/2 weeks.  She also started having significant persistent suicidal thoughts required she thought that she may not be in control.  She reports that she was having a hard time and so her psychiatrist on an emergency appointment when she started thinking of taking an overdose.  Medication adjustments did not help.  She did not identify any specific stressors that are triggering her depression.  She also had some intrusive thoughts of suffocating her some with a pillow.  She became quite tearful and felt helpless and hopeless. Past psychiatric history: The patient reports that she has been depressed off and on for the last 4 years.  She has had outpatient counseling and treatment but has not had any inpatient treatment.  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  Husband works full-time and is fairly supportive.  Her parents are also quite supportive. Mental status examination: When seen today the patient was alert and oriented cooperative and maintained fair to good eye contact.  Her speech was coherent without any obvious looseness of associations or flight of ideas or tangentiality but with some latency.  She was quite tearful during interview and has significant psychomotor retardation.  She continues to feel helpless and hopeless and continues to endorse intrusive thoughts that are quite bothersome to her.  She continues to feel quite jittery and nervous also on the medications.  While in the hospital she is contracting for safety and denies any psychotic symptoms.  She denies any racing thoughts or delusions.  She is cognitively intact.   Associated Signs/Symptoms: Depression Symptoms:  depressed mood, anhedonia, psychomotor retardation, suicidal thoughts with specific plan, anxiety, (Hypo) Manic Symptoms:  Distractibility, Impulsivity, Anxiety Symptoms:  Excessive Worry, Obsessive Compulsive Symptoms:   Patient has some intrusive thoughts., Psychotic Symptoms:   Denied any symptoms  PTSD Symptoms: Negative Total Time spent with patient: 30 minutes  Past Psychiatric History: The patient reports that she has been depressed off and on for the last 4 years.  She has had outpatient counseling and treatment but has not had any inpatient treatment.  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  Is the patient at risk to self? Yes.    Has the patient been a risk to self in the past 6 months? Yes.    Has the patient been a risk to self within the distant past? Yes.    Is the  patient a risk to others? No.  Has the patient been a risk to others in the past 6 months? No.  Has the patient been a risk to others within the distant past? No.   [place] Scale:  Flowsheet Row Admission  (Current[facility] Most recent reading at 04/17/2023  5:00 PM ED from 04/17/2023 in [facility] Most recent reading at 04/17/2023  2:46 PM  C-SSRS RISK CATEGORY High Risk High Risk        Prior Inpatient Therapy: No. If yes, describe to not being hospitalized before Prior Outpatient Therapy: Yes.   If yes, describe patient endorses that she sees Dr. Starling Manns as an outpatient  Alcohol Screening: Patient refused Alcohol Screening Tool: Yes 1. How often do you have a drink containing alcohol?: Never 2. How many drinks containing alcohol do you have on a typical day when you are drinking?: 1 or 2 3. How often do you have six or more drinks on one occasion?: Never AUDIT-C Score: 0 Substance Abuse History in the last 12 months:  No. Consequences of Substance Abuse: Negative Previous Psychotropic Medications: Yes  Psychological Evaluations: No  Past Medical History:  Past Medical History:  Diagnosis Date   Allergy    Anxiety    Asthma    Depression    Diabetes mellitus without complication    Headache    Mental disorder    Pelvic inflammatory disease 02/06/2017   PONV (postoperative nausea and vomiting)     Past Surgical History:  Procedure Laterality Date   CESAREAN SECTION  09/20/2020   Procedure: CESAREAN SECTION;  Surgeon: Christeen Douglas, MD;  Location: [facility] ORS;  Service: Obstetrics;;   Family History: History reviewed. No pertinent family history. Family Psychiatric  History: Unknown at this time. Tobacco Screening:  Social History   Tobacco Use  Smoking Status Never  Smokeless Tobacco Never    BH Tobacco Counseling     Are you interested in Tobacco Cessation Medications?  N/A, patient does not use tobacco products Counseled patient on smoking cessation:  N/A, patient does not use tobacco products Reason Tobacco Screening Not Completed: No value filed.       Social History:   Social History   Substance and Sexual Activity  Alcohol Use Yes   Comment: pt reports drinking once a month     Social History   Substance and Sexual Activity  Drug Use No    Additional Social History:                           Allergies:   Allergies  Allergen Reactions   Coffee Flavor Anaphylaxis   Latex Rash   Lab Results:  Results for orders placed or performed during the hospital encounter of 04/17/23 (from the past 48 hour(s))  Ethanol     Status: None   Collection Time: 04/17/23  1:59 PM  Result Value Ref Range   Alcohol, Ethyl (B) <10 <10 mg/dL    Comment: (NOTE) Lowest detectable limit for serum alcohol is 10 mg/dL.  For medical purposes only. Performed at [facility], [address] [place], [place] [address]   Urinalysis, Complete w Microscopic -Urine, Clean Catch     Status: Abnormal   Collection Time: 04/17/23  1:59 PM  Result Value Ref Range   Color, Urine YELLOW YELLOW   APPearance CLEAR CLEAR   Specific Gravity, Urine 1.015  1.005 - 1.030   pH 6.0 5.0 - 8.0   Glucose, UA NEGATIVE NEGATIVE mg/dL   Hgb urine dipstick SMALL (A) NEGATIVE   Bilirubin Urine NEGATIVE NEGATIVE   Ketones, ur NEGATIVE NEGATIVE mg/dL   Protein, ur NEGATIVE NEGATIVE mg/dL   Nitrite NEGATIVE NEGATIVE   Leukocytes,Ua NEGATIVE NEGATIVE   RBC / HPF 0-5 0 - 5 RBC/hpf   WBC, UA 0-5 0 - 5 WBC/hpf   Bacteria, UA RARE (A) NONE SEEN   Squamous Epithelial / HPF 0-5 0 - 5 /HPF   Mucus PRESENT     Comment: Performed at [facility], [address], [place], [place] [address]  Comprehensive metabolic panel     Status: Abnormal   Collection Time: 04/17/23  2:00 PM  Result Value Ref Range   Sodium 137 135 - 145 mmol/L   Potassium 4.4 3.5 - 5.1 mmol/L   Chloride 106 98 - 111 mmol/L   CO2 18 (L) 22 - 32 mmol/L   Glucose, Bld 77 70 - 99 mg/dL    Comment: Glucose reference range applies only to samples taken after fasting for at least 8 hours.   BUN 9 6 - 20  mg/dL   Creatinine, Ser 1.91 0.44 - 1.00 mg/dL   Calcium 9.6 8.9 - 47.8 mg/dL   Total Protein 7.2 6.5 - 8.1 g/dL   Albumin 4.3 3.5 - 5.0 g/dL   AST 29 15 - 41 U/L   ALT 45 (H) 0 - 44 U/L   Alkaline Phosphatase 58 38 - 126 U/L   Total Bilirubin 0.7 0.3 - 1.2 mg/dL   GFR, Estimated >29 >56 mL/min    Comment: (NOTE) Calculated using the CKD-EPI Creatinine Equation (2021)    Anion gap 13 5 - 15    Comment: Performed at [facility], [address] [place], [place] [address]  Magnesium     Status: None   Collection Time: 04/17/23  2:00 PM  Result Value Ref Range   Magnesium 2.1 1.7 - 2.4 mg/dL    Comment: Performed at [facility], [address], [place], [place] [address]  RPR     Status: None   Collection Time: 04/17/23  2:00 PM  Result Value Ref Range   RPR Ser Ql NON REACTIVE NON REACTIVE    Comment: Performed at [facility], [address] [place], [place] [address]  Lipid panel     Status: Abnormal   Collection Time: 04/17/23  2:00 PM  Result Value Ref Range   Cholesterol 183 0 - 200 mg/dL   Triglycerides 90 <528 mg/dL   HDL 43 >41 mg/dL   Total CHOL/HDL Ratio 4.3 RATIO   VLDL 18 0 - 40 mg/dL   LDL Cholesterol 324 (H) 0 - 99 mg/dL    Comment:        Total Cholesterol/HDL:CHD Risk Coronary Heart Disease Risk Table                     Men   Women  1/2 Average Risk   3.4   3.3  Average Risk       5.0   4.4  2 X Average Risk   9.6   7.1  3 X Average Risk  23.4   11.0        Use the calculated Patient Ratio above and the CHD Risk Table to determine the patient's CHD Risk.  ATP III CLASSIFICATION (LDL):  <100     mg/dL   Optimal  161-096  mg/dL   Near or Above                    Optimal  130-159  mg/dL   Borderline  045-409  mg/dL   High  >811     mg/dL   Very High Performed at [facility], [address] [place], [place] [address]   TSH     Status: None   Collection Time: 04/17/23  2:00 PM  Result Value Ref Range   TSH 1.836  0.350 - 4.500 uIU/mL    Comment: Performed by a 3rd Generation assay with a functional sensitivity of <=0.01 uIU/mL. Performed at [facility], [address] [place], [place] [address]     Blood Alcohol level:  Lab Results  Component Value Date   ETH <10 04/17/2023    Metabolic Disorder Labs:  Lab Results  Component Value Date   HGBA1C 5.3 02/17/2022   MPG 105 02/17/2022   MPG 125.5 09/19/2020   No results found for: "PROLACTIN" Lab Results  Component Value Date   CHOL 183 04/17/2023   TRIG 90 04/17/2023   HDL 43 04/17/2023   CHOLHDL 4.3 04/17/2023   VLDL 18 04/17/2023   LDLCALC 122 (H) 04/17/2023    Current Medications: Current Facility-Administered Medications  Medication Dose Route Frequency Provider Last Rate Last Admin   acetaminophen (TYLENOL) tablet 1,000 mg  1,000 mg Oral Q6H PRN Ardis Hughs, NP       ALPRAZolam Prudy Feeler) tablet 1 mg  1 mg Oral Daily PRN Ardis Hughs, NP       alum & mag hydroxide-simeth (MAALOX/MYLANTA) 200-200-20 MG/5ML suspension 30 mL  30 mL Oral Q4H PRN Ardis Hughs, NP       busPIRone (BUSPAR) tablet 15 mg  15 mg Oral BID Ardis Hughs, NP   15 mg at 04/18/23 0804   cariprazine (VRAYLAR) capsule 3 mg  3 mg Oral Daily Ardis Hughs, NP   3 mg at 04/18/23 0804   diphenhydrAMINE (BENADRYL) capsule 50 mg  50 mg Oral TID PRN Ardis Hughs, NP       Or   diphenhydrAMINE (BENADRYL) injection 50 mg  50 mg Intramuscular TID PRN Ardis Hughs, NP       FLUoxetine (PROZAC) capsule 40 mg  40 mg Oral q morning Ardis Hughs, NP   40 mg at 04/18/23 1034   haloperidol (HALDOL) tablet 5 mg  5 mg Oral TID PRN Ardis Hughs, NP       Or   haloperidol lactate (HALDOL) injection 5 mg  5 mg Intramuscular TID PRN Ardis Hughs, NP       hydrOXYzine (ATARAX) tablet 25 mg  25 mg Oral TID PRN Ardis Hughs, NP       LORazepam (ATIVAN) tablet 2 mg  2 mg Oral TID PRN Ardis Hughs, NP       Or    LORazepam (ATIVAN) injection 2 mg  2 mg Intramuscular TID PRN Ardis Hughs, NP       magnesium hydroxide (MILK OF MAGNESIA) suspension 30 mL  30 mL Oral Daily PRN Ardis Hughs, NP       traZODone (DESYREL) tablet 50 mg  50 mg Oral QHS PRN Ardis Hughs, NP       PTA Medications: Medications Prior to Admission  Medication Sig Dispense Refill Last Dose   acetaminophen (TYLENOL) 500 MG tablet Take 1,000 mg by mouth every 6 (six) hours as needed (For neck pain).      ALPRAZolam (XANAX) 1 MG tablet Take 1 mg by mouth daily as needed for anxiety.      busPIRone (BUSPAR) 15 MG tablet Take 15 mg by mouth 3 (three) times daily.      cariprazine (VRAYLAR) 1.5 MG capsule Take 3 mg by mouth at bedtime.      FLUoxetine (PROZAC) 40 MG capsule Take 40 mg by mouth every morning.      levonorgestrel (LILETTA, 52 MG,) 20.1 MCG/DAY IUD 1 each by Intrauterine route once.       Musculoskeletal: Strength & Muscle Tone: within normal limits Gait & Station: normal Patient leans: N/A            Psychiatric Specialty Exam:  Presentation  General Appearance:  Disheveled  Eye Contact: Fair  Speech: Slow  Speech Volume: Decreased  Handedness: Right   Mood and Affect  Mood: Anxious; Depressed  Affect: Depressed   Thought Process  Thought Processes: Linear  Duration of Psychotic Symptoms:N/A Past Diagnosis of Schizophrenia or Psychoactive disorder: No  Descriptions of Associations:Intact  Orientation:Full (Time, Place and Person)  Thought Content:Logical  Hallucinations:Hallucinations: None  Ideas of Reference:None  Suicidal Thoughts:Suicidal Thoughts: Yes, Passive SI Active Intent and/or Plan: With Intent; With Plan; With Means to Carry Out SI Passive Intent and/or Plan: With Plan; Without Intent  Homicidal Thoughts:Homicidal Thoughts: Yes, Passive HI Passive Intent and/or Plan: Without Intent; Without Plan   Sensorium  Memory: Immediate Fair;  Remote Fair; Recent Fair  Judgment: Fair  Insight: Fair   Chartered certified accountant: Fair  Attention Span: Fair  Recall: [employer] of Knowledge: Fair  Language: Fair   Psychomotor Activity  Psychomotor Activity: Psychomotor Activity: Normal   Assets  Assets: Desire for Improvement; Communication Skills; Housing   Sleep  Sleep: Sleep: Fair    Physical Exam: Physical Exam Vitals and nursing note reviewed.  Constitutional:      Appearance: Normal appearance. She is normal weight.  HENT:     Head: Normocephalic.  Neurological:     General: No focal deficit present.     Mental Status: She is alert and oriented to person, place, and time. Mental status is at baseline.    Review of Systems  Psychiatric/Behavioral:  Positive for depression and suicidal ideas. The patient is nervous/anxious.   All other systems reviewed and are negative.  Blood pressure (!) 132/92, pulse 92, temperature 98 F (36.7 C), temperature source Oral, resp. rate 16, height 5\' 2"  (1.575 m), weight 86.2 kg, SpO2 100 %, unknown if currently breastfeeding. Body mass index is 34.75 kg/m.  Treatment Plan Summary: Daily contact with patient to assess and evaluate symptoms and progress in treatment and Medication management  Observation Level/Precautions:  15 minute checks  Laboratory:   As indicated  Psychotherapy:    Medications:    Consultations:    Discharge Concerns:    Estimated LOS:  Other:    PLAN: Safety and Monitoring:             --  Voluntary admission to inpatient psychiatric unit for safety, stabilization and treatment             -- Daily contact with patient to assess and evaluate symptoms and progress in treatment             -- Patient's case  to be discussed in multi-disciplinary team meeting             -- Observation Level : q15 minute checks             -- Vital signs:  q12 hours             -- Precautions: suicide, elopement, and assault   2.  Psychiatric Diagnoses and Treatment:               --  The risks/benefits/side-effects/alternatives to this medication were discussed in detail with the patient and time was given for questions. The patient consents to medication trial.  -- FDA continue home medications until establishing contact with a psychiatrist and consider adjustments as indicated.             -- Metabolic profile and EKG monitoring obtained while on an atypical antipsychotic (BMI: Lipid Panel: HbgA1c: QTc:) as needed.             -- Encouraged patient to participate in unit milieu and in scheduled group therapies              -- [place] Term Goals: Ability to identify changes in lifestyle to reduce recurrence of condition will improve, Ability to verbalize feelings will improve, Ability to demonstrate self-control will improve, and Ability to identify triggers associated with substance abuse/mental health issues will improve             -- Long Term Goals: Improvement in symptoms so as ready for discharge                3. Medical Issues Being Addressed:              Tobacco Use Disorder             -- Nicotine patch 21mg /24 hours ordered             -- Smoking cessation encouraged Restarted home medications that include: -BuSpar 15 mg twice daily -.  Vraylar 3 mg a day -Fluoxetine 40 mg a day   4. Discharge Planning:              -- Social work and case management to assist with discharge planning and identification of hospital follow-up needs prior to discharge             -- Estimated LOS: 5-7 days             -- Discharge Concerns: Need to establish a safety plan; Medication compliance and effectiveness             -- Discharge Goals: Return home with outpatient referrals for mental health follow-up including medication management/psychotherapy     Physician Treatment Plan for Primary Diagnosis: MDD (major depressive disorder), recurrent episode, severe Long Term Goal(s): Improvement in symptoms so as ready for  discharge  [place] Term Goals: Ability to verbalize feelings will improve, Ability to disclose and discuss suicidal ideas, Ability to demonstrate self-control will improve, and Ability to identify and develop effective coping behaviors will improve  Physician Treatment Plan for Secondary Diagnosis: Principal Problem:   MDD (major depressive disorder), recurrent episode, severe  Long Term Goal(s): Improvement in symptoms so as ready for discharge  [place] Term Goals: Ability to demonstrate self-control will improve, Ability to identify and develop effective coping behaviors will improve, and Compliance with prescribed medications will improve  I certify that inpatient services furnished can reasonably be expected to improve the patient's condition.  Rex Kras, MD 4/23/202411:21 AM Total Time Spent in Direct Patient Care:  I personally spent 45 minutes on the unit in direct patient care. The direct patient care time included face-to-face time with the patient, reviewing the patient's chart, communicating with other professionals, and coordinating care. Greater than 50% of this time was spent in counseling or coordinating care with the patient regarding goals of hospitalization, psycho-education, and discharge planning needs.   Rulon Eisenmenger [facility] Psychiatrist

## 2023-04-18 NOTE — [facility] Group Notes (Signed)
Adult Psychoeducational Group Note  Date:  04/18/2023 Time:  9:53 PM  Group Topic/Focus:  Wrap-Up Group:   The focus of this group is to help patients review their daily goal of treatment and discuss progress on daily workbooks.  Participation Level:  Did Not Attend  Participation Quality:   Did not attend  Affect:   Did not attend  Cognitive:   Did not attend  Insight: None  Engagement in Group:   Did not attend  Modes of Intervention:   Did not attend  Additional Comments:  Did not attend  Christ Kick 04/18/2023, 9:53 PM

## 2023-04-19 ENCOUNTER — Encounter (HOSPITAL_COMMUNITY): Payer: Self-pay

## 2023-04-19 DIAGNOSIS — F332 Major depressive disorder, recurrent severe without psychotic features: Secondary | ICD-10-CM | POA: Diagnosis not present

## 2023-04-19 MED ORDER — FLUOXETINE HCL 20 MG PO CAPS
20.0000 mg | ORAL_CAPSULE | Freq: Every morning | ORAL | Status: DC
  Administered 2023-04-19: 20 mg via ORAL
  Filled 2023-04-19 (×3): qty 1

## 2023-04-19 MED ORDER — GABAPENTIN 300 MG PO CAPS
300.0000 mg | ORAL_CAPSULE | Freq: Three times a day (TID) | ORAL | Status: DC
  Administered 2023-04-19 – 2023-04-22 (×9): 300 mg via ORAL
  Filled 2023-04-19 (×11): qty 1

## 2023-04-19 MED ORDER — RISPERIDONE 1 MG PO TABS
1.0000 mg | ORAL_TABLET | Freq: Every day | ORAL | Status: DC
  Administered 2023-04-19 – 2023-04-21 (×3): 1 mg via ORAL
  Filled 2023-04-19 (×6): qty 1

## 2023-04-19 MED ORDER — CARIPRAZINE HCL 3 MG PO CAPS
3.0000 mg | ORAL_CAPSULE | ORAL | Status: DC
  Filled 2023-04-19 (×2): qty 1

## 2023-04-19 MED ORDER — DULOXETINE HCL 20 MG PO CPEP
20.0000 mg | ORAL_CAPSULE | Freq: Every day | ORAL | Status: DC
  Administered 2023-04-19 – 2023-04-20 (×2): 20 mg via ORAL
  Filled 2023-04-19 (×4): qty 1

## 2023-04-19 NOTE — [facility] Group Notes (Signed)
Spiritual care group on grief and loss facilitated by Chaplain Dyanne Carrel, Bcc  Group Goal: Support / Education around grief and loss  Members engage in facilitated group support and psycho-social education.  Group Description:  Following introductions and group rules, group members engaged in facilitated group dialogue and support around topic of loss, with particular support around experiences of loss in their lives. Group Identified types of loss (relationships / self / things) and identified patterns, circumstances, and changes that precipitate losses. Reflected on thoughts / feelings around loss, normalized grief responses, and recognized variety in grief experience. Group encouraged individual reflection on safe space and on the coping skills that they are already utilizing.  Group drew on Adlerian / Rogerian and narrative framework  Patient Progress: Angela Bryan attended group and participated in group activities.  Though verbal participation was limited, she remained engaged throughout the group.  Chaplain [employer], Bcc

## 2023-04-19 NOTE — Progress Notes (Signed)
   04/19/23 1028  Psych Admission Type (Psych Patients Only)  Admission Status Voluntary (Patient rescinded 72 hour form request on 04/19/2023 0940am.)  Psychosocial Assessment  Patient Complaints Anxiety;Depression  Eye Contact Fair  Facial Expression Anxious;Sad  Affect Anxious;Depressed  Speech Logical/coherent  Interaction Assertive  Motor Activity Other (Comment)  Appearance/Hygiene Unremarkable  Behavior Characteristics Cooperative;Anxious  Mood Anxious;Sad  Danger to Self  Current suicidal ideation? Denies  Agreement Not to Harm Self Yes  Description of Agreement verbally contracts for safety  Danger to Others  Danger to Others None reported or observed

## 2023-04-19 NOTE — Progress Notes (Signed)
Psychiatric progress note  Patient Identification: Angela Bryan MRN:  [id number] Date of Evaluation:  04/19/2023 Chief Complaint:  MDD (major depressive disorder), recurrent episode, severe [F33.2] Principal Diagnosis: MDD (major depressive disorder), recurrent episode, severe Diagnosis:  Principal Problem:   MDD (major depressive disorder), recurrent episode, severe  Identifying information : The patient is a 32 year old Caucasian female who was admitted to [facility].  Change on a voluntary status with symptoms of depression and increasing suicidal ideations with a plan to overdose on her medications. History of present illness: The patient reports that she sees his psychiatrist on a regular basis.  She has been placed on medications that were recently increased including the BuSpar and the Vraylar.She continues to have persistent suicidal ideations and depression.  She reports that she feels sad all the time and has been feeling this way for over 2-1/2 weeks.  She also started having significant persistent suicidal thoughts required she thought that she may not be in control.  She reports that she was having a hard time and so her psychiatrist on an emergency appointment when she started thinking of taking an overdose.  Medication adjustments did not help.  She did not identify any specific stressors that are triggering her depression.  She also had some intrusive thoughts of suffocating her some with a pillow.  She became quite tearful and felt helpless and hopeless. Chart reviewed past 24 hours:  Staff reports that the patient had been compliant with her medications.  She remains quite depressed and tearful and is focused on discharge.  She initially signed a 72-hour request for discharge reporting that she is feeling highly anxious and that she will be better off at home.  However just prior to admission patient was not only having suicidal ideation but also had limited thoughts of  smothering her child with the pill.  She has a 60-year-old at home.  She has a long history of recurrent depression and has been struggling with anxiety and depression for a long time.  She has been seeing Dr. Theodoro Kalata as an outpatient.  This Clinical research associate contacted Dr. Vonzell Schlatter office and has left a message.  The plan is to change her medications from Prozac to Cymbalta and to consider stopping the Vryalar that was making her extremely jittery. Patient has reconciled and she has withdrawn at her request for discharge.  She is contracting for safety.   Associated Signs/Symptoms: Depression Symptoms:  depressed mood, anhedonia, psychomotor retardation, suicidal thoughts with specific plan, anxiety, (Hypo) Manic Symptoms:  Distractibility, Impulsivity, Anxiety Symptoms:  Excessive Worry, Obsessive Compulsive Symptoms:   Patient has some intrusive thoughts., Psychotic Symptoms:   Denied any symptoms  PTSD Symptoms: Negative Total Time spent with patient: 30 minutes  Past Psychiatric History: The patient reports that she has been depressed off and on for the last 4 years.  She has had outpatient counseling and treatment but has not had any inpatient treatment.  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  Is the patient at risk to self? Yes.    Has the patient been a risk to self in the past 6 months? Yes.    Has the patient been a risk to self within the distant past? Yes.    Is the patient a risk to others? No.  Has the patient been a risk to others in the past 6 months? No.  Has the patient been a risk to others within the  distant past? No.   [place] Scale:  Flowsheet Row [facility] (Current[facility] Most recent reading at 04/17/2023  5:00 PM ED from 04/17/2023 in [facility] Most recent reading at 04/17/2023  2:46 PM  C-SSRS RISK CATEGORY High Risk High  Risk        Prior Inpatient Therapy: No. If yes, describe to not being hospitalized before Prior Outpatient Therapy: Yes.   If yes, describe patient endorses that she sees Dr. Starling Manns as an outpatient  Alcohol Screening: Patient refused Alcohol Screening Tool: Yes 1. How often do you have a drink containing alcohol?: Never 2. How many drinks containing alcohol do you have on a typical day when you are drinking?: 1 or 2 3. How often do you have six or more drinks on one occasion?: Never AUDIT-C Score: 0 Substance Abuse History in the last 12 months:  No. Consequences of Substance Abuse: Negative Previous Psychotropic Medications: Yes  Psychological Evaluations: No  Past Medical History:  Past Medical History:  Diagnosis Date   Allergy    Anxiety    Asthma    Depression    Diabetes mellitus without complication    Headache    Mental disorder    Pelvic inflammatory disease 02/06/2017   PONV (postoperative nausea and vomiting)     Past Surgical History:  Procedure Laterality Date   CESAREAN SECTION  09/20/2020   Procedure: CESAREAN SECTION;  Surgeon: Christeen Douglas, MD;  Location: [facility] ORS;  Service: Obstetrics;;   Family History: History reviewed. No pertinent family history. Family Psychiatric  History: Unknown at this time. Tobacco Screening:  Social History   Tobacco Use  Smoking Status Never  Smokeless Tobacco Never    BH Tobacco Counseling     Are you interested in Tobacco Cessation Medications?  N/A, patient does not use tobacco products Counseled patient on smoking cessation:  N/A, patient does not use tobacco products Reason Tobacco Screening Not Completed: No value filed.       Social History:  Social History   Substance and Sexual Activity  Alcohol Use Yes   Comment: pt reports drinking once a month     Social History   Substance and Sexual Activity  Drug Use No    Additional Social History:                           Allergies:    Allergies  Allergen Reactions   Coffee Flavor Anaphylaxis   Latex Rash   Lab Results:  Results for orders placed or performed during the hospital encounter of 04/17/23 (from the past 48 hour(s))  Ethanol     Status: None   Collection Time: 04/17/23  1:59 PM  Result Value Ref Range   Alcohol, Ethyl (B) <10 <10 mg/dL    Comment: (NOTE) Lowest detectable limit for serum alcohol is 10 mg/dL.  For medical purposes only. Performed at [facility], [address], [place], [place] [address]   Urinalysis, Complete w Microscopic -Urine, Clean Catch     Status: Abnormal   Collection Time: 04/17/23  1:59 PM  Result Value Ref Range   Color, Urine YELLOW YELLOW   APPearance CLEAR CLEAR   Specific Gravity, Urine 1.015 1.005 - 1.030   pH 6.0 5.0 - 8.0   Glucose, UA NEGATIVE NEGATIVE mg/dL   Hgb urine dipstick SMALL (A) NEGATIVE   Bilirubin Urine NEGATIVE NEGATIVE  Ketones, ur NEGATIVE NEGATIVE mg/dL   Protein, ur NEGATIVE NEGATIVE mg/dL   Nitrite NEGATIVE NEGATIVE   Leukocytes,Ua NEGATIVE NEGATIVE   RBC / HPF 0-5 0 - 5 RBC/hpf   WBC, UA 0-5 0 - 5 WBC/hpf   Bacteria, UA RARE (A) NONE SEEN   Squamous Epithelial / HPF 0-5 0 - 5 /HPF   Mucus PRESENT     Comment: Performed at [facility], [address], [place], [place] [address]  Comprehensive metabolic panel     Status: Abnormal   Collection Time: 04/17/23  2:00 PM  Result Value Ref Range   Sodium 137 135 - 145 mmol/L   Potassium 4.4 3.5 - 5.1 mmol/L   Chloride 106 98 - 111 mmol/L   CO2 18 (L) 22 - 32 mmol/L   Glucose, Bld 77 70 - 99 mg/dL    Comment: Glucose reference range applies only to samples taken after fasting for at least 8 hours.   BUN 9 6 - 20 mg/dL   Creatinine, Ser 6.04 0.44 - 1.00 mg/dL   Calcium 9.6 8.9 - 54.0 mg/dL   Total Protein 7.2 6.5 - 8.1 g/dL   Albumin 4.3 3.5 - 5.0 g/dL   AST 29 15 - 41 U/L   ALT 45 (H) 0 - 44 U/L   Alkaline Phosphatase 58 38 - 126 U/L   Total Bilirubin 0.7 0.3 - 1.2  mg/dL   GFR, Estimated >98 >11 mL/min    Comment: (NOTE) Calculated using the CKD-EPI Creatinine Equation (2021)    Anion gap 13 5 - 15    Comment: Performed at [facility], [address] [place], [place] [address]  Magnesium     Status: None   Collection Time: 04/17/23  2:00 PM  Result Value Ref Range   Magnesium 2.1 1.7 - 2.4 mg/dL    Comment: Performed at [facility], [address], [place], [place] [address]  RPR     Status: None   Collection Time: 04/17/23  2:00 PM  Result Value Ref Range   RPR Ser Ql NON REACTIVE NON REACTIVE    Comment: Performed at [facility], [address] [place], [place] [address]  Lipid panel     Status: Abnormal   Collection Time: 04/17/23  2:00 PM  Result Value Ref Range   Cholesterol 183 0 - 200 mg/dL   Triglycerides 90 <578 mg/dL   HDL 43 >46 mg/dL   Total CHOL/HDL Ratio 4.3 RATIO   VLDL 18 0 - 40 mg/dL   LDL Cholesterol 962 (H) 0 - 99 mg/dL    Comment:        Total Cholesterol/HDL:CHD Risk Coronary Heart Disease Risk Table                     Men   Women  1/2 Average Risk   3.4   3.3  Average Risk       5.0   4.4  2 X Average Risk   9.6   7.1  3 X Average Risk  23.4   11.0        Use the calculated Patient Ratio above and the CHD Risk Table to determine the patient's CHD Risk.        ATP III CLASSIFICATION (LDL):  <100     mg/dL   Optimal  952-841  mg/dL   Near or Above  Optimal  130-159  mg/dL   Borderline  161-096  mg/dL   High  >045     mg/dL   Very High Performed at [facility], [address], [place], [place] [address]   TSH     Status: None   Collection Time: 04/17/23  2:00 PM  Result Value Ref Range   TSH 1.836 0.350 - 4.500 uIU/mL    Comment: Performed by a 3rd Generation assay with a functional sensitivity of <=0.01 uIU/mL. Performed at [facility], [address], [place], [place] [address]     Blood Alcohol level:  Lab Results  Component Value Date   ETH  <10 04/17/2023    Metabolic Disorder Labs:  Lab Results  Component Value Date   HGBA1C 5.3 02/17/2022   MPG 105 02/17/2022   MPG 125.5 09/19/2020   No results found for: "PROLACTIN" Lab Results  Component Value Date   CHOL 183 04/17/2023   TRIG 90 04/17/2023   HDL 43 04/17/2023   CHOLHDL 4.3 04/17/2023   VLDL 18 04/17/2023   LDLCALC 122 (H) 04/17/2023    Current Medications: Current Facility-Administered Medications  Medication Dose Route Frequency Provider Last Rate Last Admin   acetaminophen (TYLENOL) tablet 1,000 mg  1,000 mg Oral Q6H PRN Ardis Hughs, NP       ALPRAZolam Prudy Feeler) tablet 1 mg  1 mg Oral Daily PRN Ardis Hughs, NP   1 mg at 04/18/23 2002   alum & mag hydroxide-simeth (MAALOX/MYLANTA) 200-200-20 MG/5ML suspension 30 mL  30 mL Oral Q4H PRN Ardis Hughs, NP       busPIRone (BUSPAR) tablet 15 mg  15 mg Oral BID Ardis Hughs, NP   15 mg at 04/19/23 0910   cariprazine (VRAYLAR) capsule 3 mg  3 mg Oral Q24H Rex Kras, MD       diphenhydrAMINE (BENADRYL) capsule 50 mg  50 mg Oral TID PRN Ardis Hughs, NP       Or   diphenhydrAMINE (BENADRYL) injection 50 mg  50 mg Intramuscular TID PRN Ardis Hughs, NP       DULoxetine (CYMBALTA) DR capsule 20 mg  20 mg Oral Daily Rex Kras, MD   20 mg at 04/19/23 0935   FLUoxetine (PROZAC) capsule 20 mg  20 mg Oral q morning Rex Kras, MD   20 mg at 04/19/23 0935   haloperidol (HALDOL) tablet 5 mg  5 mg Oral TID PRN Ardis Hughs, NP       Or   haloperidol lactate (HALDOL) injection 5 mg  5 mg Intramuscular TID PRN Ardis Hughs, NP       hydrOXYzine (ATARAX) tablet 25 mg  25 mg Oral TID PRN Ardis Hughs, NP   25 mg at 04/19/23 0826   LORazepam (ATIVAN) tablet 2 mg  2 mg Oral TID PRN Ardis Hughs, NP       Or   LORazepam (ATIVAN) injection 2 mg  2 mg Intramuscular TID PRN Ardis Hughs, NP       magnesium hydroxide (MILK OF MAGNESIA) suspension 30  mL  30 mL Oral Daily PRN Ardis Hughs, NP       traZODone (DESYREL) tablet 50 mg  50 mg Oral QHS PRN Ardis Hughs, NP       PTA Medications: Medications Prior to Admission  Medication Sig Dispense Refill Last Dose   acetaminophen (TYLENOL) 500 MG tablet Take 1,000 mg by  mouth every 6 (six) hours as needed (For neck pain).      ALPRAZolam (XANAX) 1 MG tablet Take 1 mg by mouth daily as needed for anxiety.      busPIRone (BUSPAR) 15 MG tablet Take 15 mg by mouth 3 (three) times daily.      cariprazine (VRAYLAR) 1.5 MG capsule Take 3 mg by mouth at bedtime.      FLUoxetine (PROZAC) 40 MG capsule Take 40 mg by mouth every morning.      levonorgestrel (LILETTA, 52 MG,) 20.1 MCG/DAY IUD 1 each by Intrauterine route once.       Musculoskeletal: Strength & Muscle Tone: within normal limits Gait & Station: normal Patient leans: N/A            Psychiatric Specialty Exam:  Presentation  General Appearance:  Casual  Eye Contact: Fair  Speech: Clear and Coherent  Speech Volume: Decreased  Handedness: Right   Mood and Affect  Mood: Anxious; Depressed  Affect: Depressed; Constricted   Thought Process  Thought Processes: Linear  Duration of Psychotic Symptoms:N/A Past Diagnosis of Schizophrenia or Psychoactive disorder: No  Descriptions of Associations:Intact  Orientation:Full (Time, Place and Person)  Thought Content:Perseveration  Hallucinations:Hallucinations: None  Ideas of Reference:None  Suicidal Thoughts:Suicidal Thoughts: Yes, Passive SI Passive Intent and/or Plan: With Intent; With Plan  Homicidal Thoughts:Homicidal Thoughts: Yes, Passive HI Passive Intent and/or Plan: Without Intent; Without Plan   Sensorium  Memory: Immediate Fair; Remote Fair; Recent Fair  Judgment: Fair  Insight: Fair   Chartered certified accountant: Fair  Attention Span: Fair  Recall: [employer] of  Knowledge: Fair  Language: Fair   Psychomotor Activity  Psychomotor Activity: Psychomotor Activity: Restlessness   Assets  Assets: Manufacturing systems engineer; Desire for Improvement; Housing   Sleep  Sleep: Sleep: Fair    Physical Exam: Physical Exam Vitals and nursing note reviewed.  Constitutional:      Appearance: Normal appearance. She is normal weight.  HENT:     Head: Normocephalic.  Neurological:     General: No focal deficit present.     Mental Status: She is alert and oriented to person, place, and time. Mental status is at baseline.    Review of Systems  Psychiatric/Behavioral:  Positive for depression and suicidal ideas. The patient is nervous/anxious.   All other systems reviewed and are negative.  Blood pressure 125/88, pulse (!) 111, temperature 98.3 F (36.8 C), temperature source Oral, resp. rate 16, height 5\' 2"  (1.575 m), weight 86.2 kg, SpO2 100 %, unknown if currently breastfeeding. Body mass index is 34.75 kg/m.  Treatment Plan Summary: Daily contact with patient to assess and evaluate symptoms and progress in treatment and Medication management  Observation Level/Precautions:  15 minute checks  Laboratory:   As indicated  Psychotherapy:    Medications:    Consultations:    Discharge Concerns:    Estimated LOS:  Other:    PLAN: Safety and Monitoring:             --  Voluntary admission to inpatient psychiatric unit for safety, stabilization and treatment             -- Daily contact with patient to assess and evaluate symptoms and progress in treatment             -- Patient's case to be discussed in multi-disciplinary team meeting             -- Observation Level : q15 minute checks             --  Vital signs:  q12 hours             -- Precautions: suicide, elopement, and assault   2. Psychiatric Diagnoses and Treatment:               --  The risks/benefits/side-effects/alternatives to this medication were discussed in detail with the  patient and time was given for questions. The patient consents to medication trial.  -- FDA continue home medications until establishing contact with a psychiatrist and consider adjustments as indicated.             -- Metabolic profile and EKG monitoring obtained while on an atypical antipsychotic (BMI: Lipid Panel: HbgA1c: QTc:) as needed.             -- Encouraged patient to participate in unit milieu and in scheduled group therapies              -- [place] Term Goals: Ability to identify changes in lifestyle to reduce recurrence of condition will improve, Ability to verbalize feelings will improve, Ability to demonstrate self-control will improve, and Ability to identify triggers associated with substance abuse/mental health issues will improve             -- Long Term Goals: Improvement in symptoms so as ready for discharge                3. Medical Issues Being Addressed:              Tobacco Use Disorder             -- Nicotine patch 21mg /24 hours ordered             -- Smoking cessation encouraged Restarted home medications that include: -Discontinue BuSpar.  Begin gabapentin 300 mg p.o. 3 times daily. -.  Vraylar 3 mg changed to bedtime.Awaiting Dr. Kizzie Bane respons.e toi change this. Taper of Prozac to 20 mg a day and begin Cymbalta 20 mg a day   4. Discharge Planning:              -- Social work and case management to assist with discharge planning and identification of hospital follow-up needs prior to discharge             -- Estimated LOS: 5-7 days             -- Discharge Concerns: Need to establish a safety plan; Medication compliance and effectiveness             -- Discharge Goals: Return home with outpatient referrals for mental health follow-up including medication management/psychotherapy     Physician Treatment Plan for Primary Diagnosis: MDD (major depressive disorder), recurrent episode, severe Long Term Goal(s): Improvement in symptoms so as ready for discharge  [place]  Term Goals: Ability to verbalize feelings will improve, Ability to disclose and discuss suicidal ideas, Ability to demonstrate self-control will improve, and Ability to identify and develop effective coping behaviors will improve  Physician Treatment Plan for Secondary Diagnosis: Principal Problem:   MDD (major depressive disorder), recurrent episode, severe  Long Term Goal(s): Improvement in symptoms so as ready for discharge  [place] Term Goals: Ability to demonstrate self-control will improve, Ability to identify and develop effective coping behaviors will improve, and Compliance with prescribed medications will improve  I certify that inpatient services furnished can reasonably be expected to improve the patient's condition.    Rex Kras, MD 4/24/20241:53 PM Total Time Spent in Direct Patient Care:  I personally spent  45 minutes on the unit in direct patient care. The direct patient care time included face-to-face time with the patient, reviewing the patient's chart, communicating with other professionals, and coordinating care. Greater than 50% of this time was spent in counseling or coordinating care with the patient regarding goals of hospitalization, psycho-education, and discharge planning needs.   Rulon Eisenmenger WUJW,JX,BJY,NWGNFA Psychiatrist Patient ID: Alan Mulder, female   DOB: 13-Jan-1991, 32 y.o.   MRN: [id number]

## 2023-04-19 NOTE — Progress Notes (Signed)
Patient rescinded 72 hour form request on 04/19/2023 0940am.

## 2023-04-19 NOTE — Plan of Care (Signed)
  Problem: Health Behavior/Discharge Planning: Goal: Compliance with treatment plan for underlying cause of condition will improve Outcome: Progressing   Problem: Safety: Goal: Periods of time without injury will increase Outcome: Progressing   

## 2023-04-19 NOTE — Progress Notes (Signed)
Pulse rate elevated, pt asymptomatic.    04/19/23 0617 04/19/23 0618  Vital Signs  Temp 98.3 F (36.8 C)  --   Temp Source Oral  --   Pulse Rate 94 (!) 111  Resp 16  --   BP 133/86 125/88  BP Location Right Arm Right Arm  BP Method Automatic Automatic  Patient Position (if appropriate) Sitting Standing

## 2023-04-19 NOTE — [facility] Group Notes (Signed)
[facility] Group Notes:  (Nursing/MHT/Case Management/Adjunct)  Date:  04/19/2023  Time:  8:34 PM  Type of Therapy:  Group Therapy  Participation Level:  Minimal  Participation Quality:  Appropriate  Affect:  Appropriate  Cognitive:  Appropriate  Insight:  Good  Engagement in Group:  Limited  Modes of Intervention:  Education  Summary of Progress/Problems: Attended NA/wrap up group.  Noah Delaine 04/19/2023, 8:34 PM

## 2023-04-19 NOTE — Group Note (Signed)
Date:  04/19/2023 Time:  9:50 AM  Group Topic/Focus:  Goals Group:   The focus of this group is to help patients establish daily goals to achieve during treatment and discuss how the patient can incorporate goal setting into their daily lives to aide in recovery. Orientation:   The focus of this group is to educate the patient on the purpose and policies of crisis stabilization and provide a format to answer questions about their admission.  The group details unit policies and expectations of patients while admitted.    Participation Level:  Active  Participation Quality:  Attentive  Affect:  Appropriate  Cognitive:  Appropriate  Insight: Appropriate  Engagement in Group:  Engaged  Modes of Intervention:  Discussion  Additional Comments:  Patient attended group and was attentive the duration of it. Patient's goal was to find coping skills for her depression.   Angela Bryan T [name] 04/19/2023, 9:50 AM

## 2023-04-19 NOTE — BH IP Treatment Plan (Signed)
Interdisciplinary Treatment and Diagnostic Plan Update  04/19/2023 Time of Session: 10:35 AM  Angela Bryan MRN: [id number]  Principal Diagnosis: MDD (major depressive disorder), recurrent episode, severe  Secondary Diagnoses: Principal Problem:   MDD (major depressive disorder), recurrent episode, severe   Current Medications:  Current Facility-Administered Medications  Medication Dose Route Frequency Provider Last Rate Last Admin   acetaminophen (TYLENOL) tablet 1,000 mg  1,000 mg Oral Q6H PRN Ardis Hughs, NP       ALPRAZolam Prudy Feeler) tablet 1 mg  1 mg Oral Daily PRN Ardis Hughs, NP   1 mg at 04/18/23 2002   alum & mag hydroxide-simeth (MAALOX/MYLANTA) 200-200-20 MG/5ML suspension 30 mL  30 mL Oral Q4H PRN Ardis Hughs, NP       diphenhydrAMINE (BENADRYL) capsule 50 mg  50 mg Oral TID PRN Ardis Hughs, NP       Or   diphenhydrAMINE (BENADRYL) injection 50 mg  50 mg Intramuscular TID PRN Ardis Hughs, NP       DULoxetine (CYMBALTA) DR capsule 20 mg  20 mg Oral Daily Rex Kras, MD   20 mg at 04/19/23 0935   FLUoxetine (PROZAC) capsule 20 mg  20 mg Oral q morning Rex Kras, MD   20 mg at 04/19/23 0935   gabapentin (NEURONTIN) capsule 300 mg  300 mg Oral TID Rex Kras, MD       haloperidol (HALDOL) tablet 5 mg  5 mg Oral TID PRN Ardis Hughs, NP       Or   haloperidol lactate (HALDOL) injection 5 mg  5 mg Intramuscular TID PRN Ardis Hughs, NP       hydrOXYzine (ATARAX) tablet 25 mg  25 mg Oral TID PRN Ardis Hughs, NP   25 mg at 04/19/23 0826   LORazepam (ATIVAN) tablet 2 mg  2 mg Oral TID PRN Ardis Hughs, NP       Or   LORazepam (ATIVAN) injection 2 mg  2 mg Intramuscular TID PRN Ardis Hughs, NP       magnesium hydroxide (MILK OF MAGNESIA) suspension 30 mL  30 mL Oral Daily PRN Ardis Hughs, NP       risperiDONE (RISPERDAL) tablet 1 mg  1 mg Oral QHS Rex Kras, MD       traZODone  (DESYREL) tablet 50 mg  50 mg Oral QHS PRN Ardis Hughs, NP       PTA Medications: Medications Prior to Admission  Medication Sig Dispense Refill Last Dose   acetaminophen (TYLENOL) 500 MG tablet Take 1,000 mg by mouth every 6 (six) hours as needed (For neck pain).      ALPRAZolam (XANAX) 1 MG tablet Take 1 mg by mouth daily as needed for anxiety.      busPIRone (BUSPAR) 15 MG tablet Take 15 mg by mouth 3 (three) times daily.      cariprazine (VRAYLAR) 1.5 MG capsule Take 3 mg by mouth at bedtime.      FLUoxetine (PROZAC) 40 MG capsule Take 40 mg by mouth every morning.      levonorgestrel (LILETTA, 52 MG,) 20.1 MCG/DAY IUD 1 each by Intrauterine route once.       Patient Stressors:    Patient Strengths:    Treatment Modalities: Medication Management, Group therapy, Case management,  1 to 1 session with clinician, Psychoeducation, Recreational therapy.   Physician Treatment Plan for Primary Diagnosis: MDD (major depressive disorder), recurrent episode, severe Long Term  Goal(s): Improvement in symptoms so as ready for discharge   [place] Term Goals: Ability to demonstrate self-control will improve Ability to identify and develop effective coping behaviors will improve Compliance with prescribed medications will improve Ability to verbalize feelings will improve Ability to disclose and discuss suicidal ideas  Medication Management: Evaluate patient's response, side effects, and tolerance of medication regimen.  Therapeutic Interventions: 1 to 1 sessions, Unit Group sessions and Medication administration.  Evaluation of Outcomes: Not Progressing  Physician Treatment Plan for Secondary Diagnosis: Principal Problem:   MDD (major depressive disorder), recurrent episode, severe  Long Term Goal(s): Improvement in symptoms so as ready for discharge   [place] Term Goals: Ability to demonstrate self-control will improve Ability to identify and develop effective coping behaviors will  improve Compliance with prescribed medications will improve Ability to verbalize feelings will improve Ability to disclose and discuss suicidal ideas     Medication Management: Evaluate patient's response, side effects, and tolerance of medication regimen.  Therapeutic Interventions: 1 to 1 sessions, Unit Group sessions and Medication administration.  Evaluation of Outcomes: Not Progressing   RN Treatment Plan for Primary Diagnosis: MDD (major depressive disorder), recurrent episode, severe Long Term Goal(s): Knowledge of disease and therapeutic regimen to maintain health will improve  [place] Term Goals: Ability to remain free from injury will improve, Ability to verbalize frustration and anger appropriately will improve, Ability to demonstrate self-control, Ability to participate in decision making will improve, Ability to verbalize feelings will improve, Ability to disclose and discuss suicidal ideas, Ability to identify and develop effective coping behaviors will improve, and Compliance with prescribed medications will improve  Medication Management: RN will administer medications as ordered by provider, will assess and evaluate patient's response and provide education to patient for prescribed medication. RN will report any adverse and/or side effects to prescribing provider.  Therapeutic Interventions: 1 on 1 counseling sessions, Psychoeducation, Medication administration, Evaluate responses to treatment, Monitor vital signs and CBGs as ordered, Perform/monitor CIWA, COWS, AIMS and Fall Risk screenings as ordered, Perform wound care treatments as ordered.  Evaluation of Outcomes: Not Progressing   LCSW Treatment Plan for Primary Diagnosis: MDD (major depressive disorder), recurrent episode, severe Long Term Goal(s): Safe transition to appropriate next level of care at discharge, Engage patient in therapeutic group addressing interpersonal concerns.  [place] Term Goals: Engage patient in  aftercare planning with referrals and resources, Increase social support, Increase ability to appropriately verbalize feelings, Increase emotional regulation, Facilitate acceptance of mental health diagnosis and concerns, Facilitate patient progression through stages of change regarding substance use diagnoses and concerns, Identify triggers associated with mental health/substance abuse issues, and Increase skills for wellness and recovery  Therapeutic Interventions: Assess for all discharge needs, 1 to 1 time with Social worker, Explore available resources and support systems, Assess for adequacy in community support network, Educate family and significant other(s) on suicide prevention, Complete Psychosocial Assessment, Interpersonal group therapy.  Evaluation of Outcomes: Not Progressing   Progress in Treatment: Attending groups: No. Participating in groups: No. Taking medication as prescribed: Yes. Toleration medication: Yes. Family/Significant other contact made: No, will contact:  Whoever pt give CSW permission to contact  Patient understands diagnosis: Yes. Discussing patient identified problems/goals with staff: Yes. Medical problems stabilized or resolved: Yes. Denies suicidal/homicidal ideation: Yes. Issues/concerns per patient self-inventory: No.   New problem(s) identified: No, Describe:  None reported   [place] Term/Long Term Goal(s):medication stabilization, elimination of SI thoughts, development of comprehensive mental wellness plan.    Patient Goals:  "  Better coping skills "   Discharge Plan or Barriers: Patient recently admitted. CSW will continue to follow and assess for appropriate referrals and possible discharge planning.    Reason for Continuation of Hospitalization: Anxiety Depression Medication stabilization Suicidal ideation  Estimated Length of Stay: 3-5  Last 3 [place] Suicide Severity Risk Score: Flowsheet Row Ad[facility] (Current[facility] Most recent reading at 04/17/2023  5:00 PM ED from 04/17/2023 in [facility] Most recent reading at 04/17/2023  2:46 PM  C-SSRS RISK CATEGORY High Risk High Risk       Last PHQ 2/9 Scores:    06/25/2018   11:28 AM 01/23/2018   10:03 AM  Depression screen PHQ 2/9  Decreased Interest 0 0  Down, Depressed, Hopeless 0 0  PHQ - 2 Score 0 0    Scribe for Treatment Team: Beather Arbour 04/19/2023 3:33 PM

## 2023-04-19 NOTE — Group Note (Signed)
Recreation Therapy Group Note   Group Topic:Health and Wellness  Group Date: 04/19/2023 Start Time: 0930 End Time: 0955 Facilitators: Aeryn Medici-McCall, LRT,CTRS Location: [address] Dayroom   Goal Area(s) Addresses:  Patient will define components of whole wellness. Patient will verbalize benefit of whole wellness.   Group Description:  Exercise.  LRT and patients discussed the importance on physical exercise and its affects on the body.  LRT then explained to patients they would take turns leading the group in exercises of their choosing.  Patients were told to consider the physical abilities of peers and to anything that would be too challenging.  Patients could take breaks and get water as needed.     Affect/Mood: N/A   Participation Level: Did not attend    Clinical Observations/Individualized Feedback:     Plan: Continue to engage patient in RT group sessions 2-3x/week.   Mervin Ramires-McCall, LRT,CTRS 04/19/2023 1:59 PM

## 2023-04-19 NOTE — Progress Notes (Signed)
72 Hour Document Signed Note  Patient Details Name: Angela Bryan MRN: [id number] DOB: 22-Jun-1991 Today's Date: 04/19/2023   72 Hour Signed Documentation:  Admission Status: Voluntary/72 hour document signed Date 72 hour document signed : 04/18/23 Time 72 hour document signed : [id number]      [facility] 04/19/2023, 4:31 AM

## 2023-04-20 DIAGNOSIS — F332 Major depressive disorder, recurrent severe without psychotic features: Secondary | ICD-10-CM | POA: Diagnosis not present

## 2023-04-20 MED ORDER — DULOXETINE HCL 30 MG PO CPEP
30.0000 mg | ORAL_CAPSULE | Freq: Every day | ORAL | Status: DC
  Administered 2023-04-21 – 2023-04-22 (×2): 30 mg via ORAL
  Filled 2023-04-20 (×3): qty 1

## 2023-04-20 NOTE — Progress Notes (Signed)
Psychiatric progress note  Patient Identification: Angela Bryan MRN:  [id number] Date of Evaluation:  04/20/2023 Chief Complaint:  MDD (major depressive disorder), recurrent episode, severe [F33.2] Principal Diagnosis: MDD (major depressive disorder), recurrent episode, severe Diagnosis:  Principal Problem:   MDD (major depressive disorder), recurrent episode, severe  Identifying information : The patient is a 32 year old Caucasian female who was admitted to [facility].  Change on a voluntary status with symptoms of depression and increasing suicidal ideations with a plan to overdose on her medications. History of present illness: The patient reports that she sees his psychiatrist on a regular basis.  She has been placed on medications that were recently increased including the BuSpar and the Vraylar.She continues to have persistent suicidal ideations and depression.  She reports that she feels sad all the time and has been feeling this way for over 2-1/2 weeks.  She also started having significant persistent suicidal thoughts required she thought that she may not be in control.  She reports that she was having a hard time and so her psychiatrist on an emergency appointment when she started thinking of taking an overdose.  Medication adjustments did not help.  She did not identify any specific stressors that are triggering her depression.  She also had some intrusive thoughts of suffocating her some with a pillow.  She became quite tearful and felt helpless and hopeless. Chart reviewed past 24 hours:  Staff reports that the patient has been compliant with medications.  She received hydroxyzine and Xanax last night as a as needed medication.  She slept 8 hours.  Reports feeling rested and has been attending groups.  Staff also reports that she has rescinded her 72-hour request for discharge.  Today's assessment: The patient is a psychiatrist was contacted yesterday.  Dr. Kizzie Bane was in agreement  with medication changes.  After the patient rescinded her 72-hour request for discharge, she agreed for adjustment of medications and she was placed on Cymbalta and Prozac was being titrated off.  She also placed on gabapentin after discontinuation of the BuSpar.  When seen today she was alert oriented and cooperative and maintained fair to good eye contact.  Her affect was dysthymic.  She has some psychomotor retardation.  She denied any SI/HI/AVH.  She reported an improvement of her anxiety and rates it at 4/10.  She is claims that her depression is also low at a 3/10.  She feels a lot better and has not noticed any significant changes or side effects on medication changes.  Patient is contracting for safety.  She does endorse some intrusive thoughts while at home and these intrusive thoughts involved her and her son.  Today she is contracting for safety.   Associated Signs/Symptoms: Depression Symptoms:  depressed mood, anhedonia, psychomotor retardation, suicidal thoughts with specific plan, anxiety, (Hypo) Manic Symptoms:  Distractibility, Impulsivity, Anxiety Symptoms:  Excessive Worry, Obsessive Compulsive Symptoms:   Patient has some intrusive thoughts., Psychotic Symptoms:   Denied any symptoms  PTSD Symptoms: Negative Total Time spent with patient: 30 minutes  Past Psychiatric History: The patient reports that she has been depressed off and on for the last 4 years.  She has had outpatient counseling and treatment but has not had any inpatient treatment.  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  Is the patient at risk to self? Yes.    Has the patient been a risk to self in the past 6 months? Yes.  Has the patient been a risk to self within the distant past? Yes.    Is the patient a risk to others? No.  Has the patient been a risk to others in the past 6 months? No.  Has the patient been a risk to others within the  distant past? No.   [place] Scale:  Flowsheet Row [facility] (Current[facility] Most recent reading at 04/17/2023  5:00 PM ED from 04/17/2023 in [facility] Most recent reading at 04/17/2023  2:46 PM  C-SSRS RISK CATEGORY High Risk High Risk        Prior Inpatient Therapy: No. If yes, describe to not being hospitalized before Prior Outpatient Therapy: Yes.   If yes, describe patient endorses that she sees Dr. Starling Manns as an outpatient  Alcohol Screening: Patient refused Alcohol Screening Tool: Yes 1. How often do you have a drink containing alcohol?: Never 2. How many drinks containing alcohol do you have on a typical day when you are drinking?: 1 or 2 3. How often do you have six or more drinks on one occasion?: Never AUDIT-C Score: 0 Substance Abuse History in the last 12 months:  No. Consequences of Substance Abuse: Negative Previous Psychotropic Medications: Yes  Psychological Evaluations: No  Past Medical History:  Past Medical History:  Diagnosis Date   Allergy    Anxiety    Asthma    Depression    Diabetes mellitus without complication    Headache    Mental disorder    Pelvic inflammatory disease 02/06/2017   PONV (postoperative nausea and vomiting)     Past Surgical History:  Procedure Laterality Date   CESAREAN SECTION  09/20/2020   Procedure: CESAREAN SECTION;  Surgeon: Christeen Douglas, MD;  Location: [facility] ORS;  Service: Obstetrics;;   Family History: History reviewed. No pertinent family history. Family Psychiatric  History: Unknown at this time. Tobacco Screening:  Social History   Tobacco Use  Smoking Status Never  Smokeless Tobacco Never    BH Tobacco Counseling     Are you interested in Tobacco Cessation Medications?  N/A, patient does not use tobacco products Counseled patient on smoking cessation:  N/A, patient does not use tobacco products Reason Tobacco Screening  Not Completed: No value filed.       Social History:  Social History   Substance and Sexual Activity  Alcohol Use Yes   Comment: pt reports drinking once a month     Social History   Substance and Sexual Activity  Drug Use No    Additional Social History: Marital status: Long term relationship Long term relationship, how long?: several years What types of issues is patient dealing with in the relationship?: denies Additional relationship information: denies Are you sexually active?: Yes What is your sexual orientation?: heterosexual Does patient have children?: Yes How many children?: 1 How is patient's relationship with their children?: 2 yrs old                         Allergies:   Allergies  Allergen Reactions   Coffee Flavor Anaphylaxis   Latex Rash   Lab Results:  No results found for this or any previous visit (from the past 48 hour(s)).   Blood Alcohol level:  Lab Results  Component Value Date   [facility] <10 04/17/2023    Metabolic Disorder Labs:  Lab Results  Component Value Date   HGBA1C 5.3 02/17/2022  MPG 105 02/17/2022   MPG 125.5 09/19/2020   No results found for: "PROLACTIN" Lab Results  Component Value Date   CHOL 183 04/17/2023   TRIG 90 04/17/2023   HDL 43 04/17/2023   CHOLHDL 4.3 04/17/2023   VLDL 18 04/17/2023   LDLCALC 122 (H) 04/17/2023    Current Medications: Current Facility-Administered Medications  Medication Dose Route Frequency Provider Last Rate Last Admin   acetaminophen (TYLENOL) tablet 1,000 mg  1,000 mg Oral Q6H PRN Ardis Hughs, NP       ALPRAZolam Prudy Feeler) tablet 1 mg  1 mg Oral Daily PRN Ardis Hughs, NP   1 mg at 04/18/23 2002   alum & mag hydroxide-simeth (MAALOX/MYLANTA) 200-200-20 MG/5ML suspension 30 mL  30 mL Oral Q4H PRN Ardis Hughs, NP       diphenhydrAMINE (BENADRYL) capsule 50 mg  50 mg Oral TID PRN Ardis Hughs, NP       Or   diphenhydrAMINE (BENADRYL) injection 50 mg  50  mg Intramuscular TID PRN Ardis Hughs, NP       Melene Muller ON 04/21/2023] DULoxetine (CYMBALTA) DR capsule 30 mg  30 mg Oral Daily Rex Kras, MD       gabapentin (NEURONTIN) capsule 300 mg  300 mg Oral TID Rex Kras, MD   300 mg at 04/20/23 1610   haloperidol (HALDOL) tablet 5 mg  5 mg Oral TID PRN Ardis Hughs, NP       Or   haloperidol lactate (HALDOL) injection 5 mg  5 mg Intramuscular TID PRN Ardis Hughs, NP       hydrOXYzine (ATARAX) tablet 25 mg  25 mg Oral TID PRN Ardis Hughs, NP   25 mg at 04/19/23 0826   LORazepam (ATIVAN) tablet 2 mg  2 mg Oral TID PRN Ardis Hughs, NP       Or   LORazepam (ATIVAN) injection 2 mg  2 mg Intramuscular TID PRN Ardis Hughs, NP       magnesium hydroxide (MILK OF MAGNESIA) suspension 30 mL  30 mL Oral Daily PRN Ardis Hughs, NP       risperiDONE (RISPERDAL) tablet 1 mg  1 mg Oral QHS Rex Kras, MD   1 mg at 04/19/23 2130   traZODone (DESYREL) tablet 50 mg  50 mg Oral QHS PRN Ardis Hughs, NP       PTA Medications: Medications Prior to Admission  Medication Sig Dispense Refill Last Dose   acetaminophen (TYLENOL) 500 MG tablet Take 1,000 mg by mouth every 6 (six) hours as needed (For neck pain).      ALPRAZolam (XANAX) 1 MG tablet Take 1 mg by mouth daily as needed for anxiety.      busPIRone (BUSPAR) 15 MG tablet Take 15 mg by mouth 3 (three) times daily.      cariprazine (VRAYLAR) 1.5 MG capsule Take 3 mg by mouth at bedtime.      FLUoxetine (PROZAC) 40 MG capsule Take 40 mg by mouth every morning.      levonorgestrel (LILETTA, 52 MG,) 20.1 MCG/DAY IUD 1 each by Intrauterine route once.       Musculoskeletal: Strength & Muscle Tone: within normal limits Gait & Station: normal Patient leans: N/A            Psychiatric Specialty Exam:  Presentation  General Appearance:  Appropriate for Environment  Eye Contact: Fair  Speech: Clear and Coherent  Speech  Volume: Decreased  Handedness: Right   Mood and Affect  Mood: Anxious; Depressed  Affect: Constricted   Thought Process  Thought Processes: Linear  Duration of Psychotic Symptoms:N/A Past Diagnosis of Schizophrenia or Psychoactive disorder: No  Descriptions of Associations:Intact  Orientation:Full (Time, Place and Person)  Thought Content:Logical  Hallucinations:Hallucinations: None  Ideas of Reference:None  Suicidal Thoughts:Suicidal Thoughts: No SI Passive Intent and/or Plan: With Intent; With Plan  Homicidal Thoughts:Homicidal Thoughts: No HI Passive Intent and/or Plan: Without Intent; Without Plan   Sensorium  Memory: Immediate Fair; Remote Fair; Recent Fair  Judgment: Fair  Insight: Fair   Chartered certified accountant: Fair  Attention Span: Fair  Recall: [employer] of Knowledge: Fair  Language: Fair   Psychomotor Activity  Psychomotor Activity: Psychomotor Activity: Decreased   Assets  Assets: Communication Skills; Desire for Improvement; Resilience   Sleep  Sleep: Sleep: Fair    Physical Exam: Physical Exam Vitals and nursing note reviewed.  Constitutional:      Appearance: Normal appearance. She is normal weight.  HENT:     Head: Normocephalic.  Neurological:     General: No focal deficit present.     Mental Status: She is alert and oriented to person, place, and time. Mental status is at baseline.    Review of Systems  Psychiatric/Behavioral:  Positive for depression and suicidal ideas. The patient is nervous/anxious.   All other systems reviewed and are negative.  Blood pressure 106/67, pulse (!) 107, temperature 98 F (36.7 C), temperature source Oral, resp. rate 16, height 5\' 2"  (1.575 m), weight 86.2 kg, SpO2 98 %, unknown if currently breastfeeding. Body mass index is 34.75 kg/m.  Treatment Plan Summary: Daily contact with patient to assess and evaluate symptoms and progress in treatment and  Medication management  Observation Level/Precautions:  15 minute checks  Laboratory:   As indicated  Psychotherapy:    Medications:    Consultations:    Discharge Concerns:    Estimated LOS:  Other:    PLAN: Safety and Monitoring:             --  Voluntary admission to inpatient psychiatric unit for safety, stabilization and treatment             -- Daily contact with patient to assess and evaluate symptoms and progress in treatment             -- Patient's case to be discussed in multi-disciplinary team meeting             -- Observation Level : q15 minute checks             -- Vital signs:  q12 hours             -- Precautions: suicide, elopement, and assault   2. Psychiatric Diagnoses and Treatment:               --  The risks/benefits/side-effects/alternatives to this medication were discussed in detail with the patient and time was given for questions. The patient consents to medication trial.  -- FDA continue home medications until establishing contact with a psychiatrist and consider adjustments as indicated.             -- Metabolic profile and EKG monitoring obtained while on an atypical antipsychotic (BMI: Lipid Panel: HbgA1c: QTc:) as needed.             -- Encouraged patient to participate in unit milieu and in scheduled group therapies              --  [place] Term Goals: Ability to identify changes in lifestyle to reduce recurrence of condition will improve, Ability to verbalize feelings will improve, Ability to demonstrate self-control will improve, and Ability to identify triggers associated with substance abuse/mental health issues will improve             -- Long Term Goals: Improvement in symptoms so as ready for discharge                3. Medical Issues Being Addressed:              Tobacco Use Disorder             -- Nicotine patch 21mg /24 hours ordered             -- Smoking cessation encouraged Restarted home medications that include: -Discontinue BuSpar.  Begin  gabapentin 300 mg p.o. 3 times daily. -Discontinued Vraylar 3 mg at bedtime.  Started on Risperdal 1 mg at bedtime. -Discontinued Prozac.  Increased Cymbalta to 30 mg a day.  4. Discharge Planning:              -- Social work and case management to assist with discharge planning and identification of hospital follow-up needs prior to discharge             -- Estimated LOS: Possible discharge by Monday, 04/25/2023             -- Discharge Concerns: Need to establish a safety plan; Medication compliance and effectiveness             -- Discharge Goals: Return home with outpatient referrals for mental health follow-up including medication management/psychotherapy     Physician Treatment Plan for Primary Diagnosis: MDD (major depressive disorder), recurrent episode, severe Long Term Goal(s): Improvement in symptoms so as ready for discharge  [place] Term Goals: Ability to verbalize feelings will improve, Ability to disclose and discuss suicidal ideas, Ability to demonstrate self-control will improve, and Ability to identify and develop effective coping behaviors will improve  Physician Treatment Plan for Secondary Diagnosis: Principal Problem:   MDD (major depressive disorder), recurrent episode, severe  Long Term Goal(s): Improvement in symptoms so as ready for discharge  [place] Term Goals: Ability to demonstrate self-control will improve, Ability to identify and develop effective coping behaviors will improve, and Compliance with prescribed medications will improve  I certify that inpatient services furnished can reasonably be expected to improve the patient's condition.    Rex Kras, MD 4/25/202410:23 AM Total Time Spent in Direct Patient Care:  I personally spent 45 minutes on the unit in direct patient care. The direct patient care time included face-to-face time with the patient, reviewing the patient's chart, communicating with other professionals, and coordinating care. Greater than  50% of this time was spent in counseling or coordinating care with the patient regarding goals of hospitalization, psycho-education, and discharge planning needs.   Rulon Eisenmenger ZOXW,RU,EAV,[id number] Psychiatrist Patient ID: Alan Mulder, female   DOB: [birth year], 32 y.o.   MRN: [id number] Patient ID: Alan Mulder, female   DOB: Sep 11, 1991, 32 y.o.   MRN: [id number]

## 2023-04-20 NOTE — [facility] Group Notes (Signed)
[facility] Group Notes:  (Nursing/MHT/Case Management/Adjunct)  Date:  04/20/2023  Time:  2000  Type of Therapy:   wrap up group  Participation Level:  Active  Participation Quality:  Appropriate, Attentive, Sharing, and Supportive  Affect:  Anxious  Cognitive:  Alert  Insight:  Improving  Engagement in Group:  Engaged  Modes of Intervention:  Clarification, Education, and Support  Summary of Progress/Problems: Positive thinking and positive change were discussed.   Angela Bryan 04/20/2023, 10:27 PM

## 2023-04-20 NOTE — Group Note (Signed)
LCSW Group Therapy Note  Group Date: 04/20/2023 Start Time: 1100 End Time: 1200   Type of Therapy and Topic:  Group Therapy - How To Cope with Nervousness about Discharge   Participation Level:  Active   Description of Group This process group involved identification of patients' feelings about discharge. Some of them are scheduled to be discharged soon, while others are new admissions, but each of them was asked to share thoughts and feelings surrounding discharge from the hospital. One common theme was that they are excited at the prospect of going home, while another was that many of them are apprehensive about sharing why they were hospitalized. Patients were given the opportunity to discuss these feelings with their peers in preparation for discharge.  Therapeutic Goals  Patient will identify their overall feelings about pending discharge. Patient will think about how they might proactively address issues that they believe will once again arise once they get home (i.e. with parents). Patients will participate in discussion about having hope for change.   Summary of Patient Progress:  Patient was very active throughout the session. Patient demonstrated positive and reasonable insight into the subject matter, and proved open to input from peers and feedback from CSW. Patient was respectful of peers and participated throughout the entire session.   Therapeutic Modalities Cognitive Behavioral Therapy   Beather Arbour 04/20/2023  1:11 PM

## 2023-04-20 NOTE — [facility] Counselor (Signed)
Adult Comprehensive Assessment  Patient ID: Peter Daquila, female   DOB: [birth year], 32 y.o.   MRN: [id number]  Information Source: Information source: Patient  Current Stressors:  Patient states their primary concerns and needs for treatment are:: "to get my medication right." Patient states their goals for this hospitilization and ongoing recovery are:: "to feel better and get my medication figured out." Educational / Learning stressors: denies Employment / Job issues: unemployed Family Relationships: denies Surveyor, quantity / Lack of resources (include bankruptcy): denies Housing / Lack of housing: denies Physical health (include injuries & life threatening diseases): denies Social relationships: reports no social relationships Substance abuse: denies Bereavement / Loss: loss of independence as she was working full time  Living/Environment/Situation:  Living Arrangements: Spouse/significant other Who else lives in the home?: husband and child How long has patient lived in current situation?: few years What is atmosphere in current home: Comfortable  Family History:  Marital status: Long term relationship Long term relationship, how long?: several years What types of issues is patient dealing with in the relationship?: denies Additional relationship information: denies Are you sexually active?: Yes What is your sexual orientation?: heterosexual Does patient have children?: Yes How many children?: 1 How is patient's relationship with their children?: 2 yrs old  Childhood History:  By whom was/is the patient raised?: Both parents Description of patient's relationship with caregiver when they were a child: "ok" Patient's description of current relationship with people who raised him/her: "I have a good relationship with my mom.  My mom has my child now." Does patient have siblings?: Yes Number of Siblings: 2 Description of patient's current relationship with siblings: it  depends Did patient suffer any verbal/emotional/physical/sexual abuse as a child?: No Did patient suffer from severe childhood neglect?: No Has patient ever been sexually abused/assaulted/raped as an adolescent or adult?: No Was the patient ever a victim of a crime or a disaster?: No Witnessed domestic violence?: No Has patient been affected by domestic violence as an adult?: No  Education:  Highest grade of school patient has completed: Engineer, maintenance (IT) Currently a Consulting civil engineer?: No Learning disability?: No  Employment/Work Situation:   Employment Situation: Unemployed Patient's Job has Been Impacted by Current Illness: No What is the Longest Time Patient has Held a Job?: 6 years 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?: No  Financial Resources:   Financial resources: Income from spouse Does patient have a representative payee or guardian?: No  Alcohol/Substance Abuse:   What has been your use of drugs/alcohol within the last 12 months?: denies Has alcohol/substance abuse ever caused legal problems?: No  Social Support System:   Conservation officer, nature Support System: Fair Type of faith/religion: n/a How does patient's faith help to cope with current illness?: n/a  Leisure/Recreation:   Do You Have Hobbies?: Yes Leisure and Hobbies: reading and riding 4 wheelers  Strengths/Needs:   What is the patient's perception of their strengths?: "I don't know" Patient states they can use these personal strengths during their treatment to contribute to their recovery: "I don't know" Patient states these barriers may affect/interfere with their treatment: "I don't know" Patient states these barriers may affect their return to the community: "I don't know" Other important information patient would like considered in planning for their treatment: "I don't know"  Discharge Plan:   Currently receiving community mental health services:  No Patient states concerns and preferences for aftercare planning are: "I do need someone to  talk to." Patient states they will know when they are safe and ready for discharge when: "my meds are right." Does patient have access to transportation?: Yes Does patient have financial barriers related to discharge medications?: No Patient description of barriers related to discharge medications: none Will patient be returning to same living situation after discharge?: Yes  Summary/Recommendations:   Summary and Recommendations (to be completed by the evaluator): Janace is a 32 year old woman that was admitted into [facility] on 04/17/23. She reports that her medication has not been working and worsening of her depression and suicidal thoughts with a plan to overdose on her medications. Per psychiatrist "Michaeleen Larrivee was treated in our office today for emergent appointment due to unstable depression with active suicidal ideations. Medication was adjusted last week w/o effect or reduction of suicidal/severe depressive symptoms. Pt is with family and is willing to be evaluated and admitted t treatment". Per note patient current medications include buspirone  BID, Xanax , Vraylar 1.5mg  QD -increased  last week, fluoxetine, , QD and promethazine . Patient current diagnosis is MDD, recurrent severe, panic disorder and anxiety, unspecified.  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  She has a 35 year old child that she cares for. While here, Ziyonna can benefit from crisis stabilization, medication management, therapeutic milieu, and referrals for services.  Marinda Elk. 04/20/2023

## 2023-04-20 NOTE — Progress Notes (Signed)
   04/19/23 2300  Psych Admission Type (Psych Patients Only)  Admission Status Voluntary  Psychosocial Assessment  Patient Complaints Depression;Anxiety  Eye Contact Fair  Facial Expression Flat  Affect Depressed  Speech Slow  Interaction Assertive  Motor Activity Other (Comment) (WNL)  Appearance/Hygiene Unremarkable  Behavior Characteristics Cooperative;Anxious  Mood Depressed;Anxious  Aggressive Behavior  Effect No apparent injury  Thought Process  Coherency WDL  Content WDL  Delusions None reported or observed  Perception WDL  Hallucination None reported or observed  Judgment WDL  Confusion None  Danger to Self  Current suicidal ideation? Denies  Agreement Not to Harm Self Yes  Description of Agreement verbal  Danger to Others  Danger to Others None reported or observed

## 2023-04-20 NOTE — [facility] Suicide Risk Assessment (Signed)
[facility] INPATIENT:  Family/Significant Other Suicide Prevention Education  Suicide Prevention Education:  Education Completed; Angela Bryan,  (mother) has been identified by the patient as the family member/significant other with whom the patient will be residing, and identified as the person(s) who will aid the patient in the event of a mental health crisis (suicidal ideations/suicide attempt).  With written consent from the patient, the family member/significant other has been provided the following suicide prevention education, prior to the and/or following the discharge of the patient.  The suicide prevention education provided includes the following: Suicide risk factors Suicide prevention and interventions National Suicide Hotline telephone number [facility] assessment telephone number [facility] Emergency Assistance 911 [facility] and/or Residential Mobile Crisis Unit telephone number  Request made of family/significant other to: Remove weapons (e.g., guns, rifles, knives), all items previously/currently identified as safety concern.   Remove drugs/medications (over-the-counter, prescriptions, illicit drugs), all items previously/currently identified as a safety concern.  The family member/significant other verbalizes understanding of the suicide prevention education information provided.  The family member/significant other agrees to remove the items of safety concern listed above.  Angela Bryan 04/20/2023, 11:52 AM

## 2023-04-20 NOTE — Progress Notes (Signed)
   04/20/23 2300  Psych Admission Type (Psych Patients Only)  Admission Status Voluntary  Psychosocial Assessment  Patient Complaints Depression  Eye Contact Fair  Facial Expression Flat  Affect Depressed  Speech Soft  Interaction Assertive  Motor Activity Slow  Appearance/Hygiene Unremarkable  Behavior Characteristics Cooperative;Calm  Mood Depressed;Pleasant  Aggressive Behavior  Effect No apparent injury  Thought Process  Coherency WDL  Content WDL  Delusions None reported or observed  Perception WDL  Hallucination None reported or observed  Judgment WDL  Confusion None  Danger to Self  Current suicidal ideation? Denies  Agreement Not to Harm Self Yes  Description of Agreement verbal  Danger to Others  Danger to Others None reported or observed

## 2023-04-20 NOTE — Progress Notes (Signed)
   04/20/23 1143  Psych Admission Type (Psych Patients Only)  Admission Status Voluntary  Psychosocial Assessment  Patient Complaints Depression;Anxiety  Eye Contact Fair  Facial Expression Flat  Affect Depressed  Speech Slow  Interaction Assertive  Motor Activity Other (Comment)  Appearance/Hygiene Unremarkable  Behavior Characteristics Cooperative;Anxious  Mood Depressed;Anxious  Aggressive Behavior  Effect No apparent injury  Thought Process  Coherency WDL  Content WDL  Delusions None reported or observed  Perception WDL  Hallucination None reported or observed  Judgment WDL  Confusion None  Danger to Self  Current suicidal ideation? Denies  Agreement Not to Harm Self Yes  Description of Agreement verbal  Danger to Others  Danger to Others None reported or observed

## 2023-04-20 NOTE — Progress Notes (Signed)
Chaplain engaged Angela Bryan in a conversation for support.  She recently lost her dog and her grandmother died a few years ago.  She was tearful and shared some about her emotions, but did not wish to talk about this much. She has good support from her mom and boyfriend and is already making plans for how to better support herself.  She feels positive about the plans she is making.  Chaplain provided emotional support through active listening and encouragement.  [address] Bcc Pager, [phone]

## 2023-04-20 NOTE — Progress Notes (Signed)
   04/20/23 0704  15 Minute Checks  Location Cafeteria  Visual Appearance Calm  Behavior Composed  Sleep (Behavioral Health Patients Only)  Calculate sleep? (Click Yes once per 24 hr at 0600 safety check) Yes  Documented sleep last 24 hours 8

## 2023-04-20 NOTE — Group Note (Signed)
Occupational Therapy Group Note  Group Topic:Coping Skills  Group Date: 04/20/2023 Start Time: 1430 End Time: 1500 Facilitators: Ted Mcalpine, OT   Group Description: Group encouraged increased engagement and participation through discussion and activity focused on "Coping Ahead." Patients were split up into teams and selected a card from a stack of positive coping strategies. Patients were instructed to act out/charade the coping skill for other peers to guess and receive points for their team. Discussion followed with a focus on identifying additional positive coping strategies and patients shared how they were going to cope ahead over the weekend while continuing hospitalization stay.  Therapeutic Goal(s): Identify positive vs negative coping strategies. Identify coping skills to be used during hospitalization vs coping skills outside of hospital/at home Increase participation in therapeutic group environment and promote engagement in treatment   Participation Level: Engaged   Participation Quality: Independent   Behavior: Appropriate   Speech/Thought Process: Relevant   Affect/Mood: Flat   Insight: Limited   Judgement: Limited      Modes of Intervention: Education  Patient Response to Interventions:  Attentive   Plan: Continue to engage patient in OT groups 2 - 3x/week.  04/20/2023  Ted Mcalpine, OT Kerrin Champagne, OT

## 2023-04-21 DIAGNOSIS — F332 Major depressive disorder, recurrent severe without psychotic features: Secondary | ICD-10-CM | POA: Diagnosis not present

## 2023-04-21 NOTE — [facility] Counselor (Signed)
[facility] LCSW Progress Note   04/21/2023    4:03 PM  Deshea Danielle Musselman      Type of Note: Call With Mom   CSW spoke with patient mom and mom stated that she thinks that patient is ready to DC based on her visits and conversations on the phone with patient. CSW reached informed the doctor through secure chat of this info.     Signed:   Jacob Moores, MSW, [facility] 04/21/2023 4:03 PM

## 2023-04-21 NOTE — [facility] Group Notes (Signed)
Adult Psychoeducational Group Note  Date:  04/21/2023 Time:  11:21 AM  Group Topic/Focus:  Goals Group:   The focus of this group is to help patients establish daily goals to achieve during treatment and discuss how the patient can incorporate goal setting into their daily lives to aide in recovery.  Participation Level:  Active  Participation Quality:  Appropriate  Affect:  Appropriate  Cognitive:  Appropriate  Insight: Appropriate  Engagement in Group:  Engaged  Modes of Intervention:  Education and Problem-solving  Additional Comments:  PT participated  Gwinda Maine 04/21/2023, 11:21 AM

## 2023-04-21 NOTE — [facility] Group Notes (Signed)
Adult Psychoeducational Group Note  Date:  04/21/2023 Time:  10:20 PM  Group Topic/Focus:  Wrap-Up Group:  Alcohol Anonymous Group   Participation Level:  Active  Participation Quality:  Appropriate  Affect:  Appropriate  Cognitive:  Appropriate  Insight: Appropriate  Engagement in Group:    Modes of Intervention:  Discussion  Additional Comments:  Pt attended AA group 

## 2023-04-21 NOTE — [facility] Group Notes (Signed)
Adult Psychoeducational Group Note  Date:  04/21/2023 Time:  9:46 AM  Group Topic/Focus:  Goals Group:   The focus of this group is to help patients establish daily goals to achieve during treatment and discuss how the patient can incorporate goal setting into their daily lives to aide in recovery.  Participation Level:  Active  Participation Quality:  Appropriate  Affect:  Appropriate  Cognitive:  Appropriate  Insight: Appropriate  Engagement in Group:  Engaged  Modes of Intervention:  Education  Additional Comments:  Goal: Continue to be social  Gwinda Maine 04/21/2023, 9:46 AM

## 2023-04-21 NOTE — Progress Notes (Signed)
Psychiatric progress note  Patient Identification: Angela Bryan MRN:  [id number] Date of Evaluation:  04/21/2023 Chief Complaint:  MDD (major depressive disorder), recurrent episode, severe (HCC) [F33.2] Principal Diagnosis: MDD (major depressive disorder), recurrent episode, severe (HCC) Diagnosis:  Principal Problem:   MDD (major depressive disorder), recurrent episode, severe (HCC)  Identifying information : The patient is a 32 year old Caucasian female who was admitted to [facility].  Change on a voluntary status with symptoms of depression and increasing suicidal ideations with a plan to overdose on her medications.  Chart reviewed past 24 hours:  Staff reports that the patient has been doing fairly well.  She slept better.  Her anxiety is improved and she is compliant with medications.  She is attending groups.  She received hydroxyzine as a as needed last night. Today's assessment: The patient was seen today and the chart was reviewed and the case was discussed with the treatment team.  Patient seems to be tolerating the changes in medications well.  She is currently off the Prozac and is on duloxetine and gabapentin.  She is taking 1 mg of Risperdal at night with no side effects.  She is not taking any as needed medications other than occasionally asking for hydroxyzine.  When interviewed today she is alert oriented and cooperative.  She maintained good eye contact.  She does endorse improving anxiety and also endorses improving depression.  She rates her depression today at a 3/10 and anxiety at this 7/10.  She is denying any intrusive thoughts but does admit to obsessive compulsive tendencies while growing up and having vivid images in her brain prior to admission.  She is currently denying any active SI/HI/AVH.  She is contracting for safety. The plan is to continue with the titration of medications and consider discharge with a safety plan by Monday morning.   Associated  Signs/Symptoms: Depression Symptoms:  depressed mood, anhedonia, psychomotor retardation, suicidal thoughts with specific plan, anxiety, (Hypo) Manic Symptoms:  Distractibility, Impulsivity, Anxiety Symptoms:  Excessive Worry, Obsessive Compulsive Symptoms:   Patient has some intrusive thoughts., Psychotic Symptoms:   Denied any symptoms  PTSD Symptoms: Negative Total Time spent with patient: 30 minutes  Past Psychiatric History: The patient reports that she has been depressed off and on for the last 4 years.  She has had outpatient counseling and treatment but has not had any inpatient treatment.  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  Is the patient at risk to self? Yes.    Has the patient been a risk to self in the past 6 months? Yes.    Has the patient been a risk to self within the distant past? Yes.    Is the patient a risk to others? No.  Has the patient been a risk to others in the past 6 months? No.  Has the patient been a risk to others within the distant past? No.   [place] Scale:  Flowsheet Row [facility]Current[facility] Most recent reading at 04/17/2023  5:00 PM ED from 04/17/2023 in [facility] Most recent reading at 04/17/2023  2:46 PM  C-SSRS RISK CATEGORY High Risk High Risk        Prior Inpatient Therapy: No. If yes, describe to not being hospitalized before Prior Outpatient Therapy: Yes.   If yes, describe patient endorses that she sees Dr. Starling Manns as an outpatient  Alcohol  Screening: Patient refused Alcohol Screening Tool: Yes 1. How often do you have a drink containing alcohol?: Never 2. How many drinks containing alcohol do you have on a typical day when you are drinking?: 1 or 2 3. How often do you have six or more drinks on one occasion?: Never AUDIT-C Score: 0 Substance Abuse History in the last 12  months:  No. Consequences of Substance Abuse: Negative Previous Psychotropic Medications: Yes  Psychological Evaluations: No  Past Medical History:  Past Medical History:  Diagnosis Date   Allergy    Anxiety    Asthma    Depression    Diabetes mellitus without complication (HCC)    Headache    Mental disorder    Pelvic inflammatory disease 02/06/2017   PONV (postoperative nausea and vomiting)     Past Surgical History:  Procedure Laterality Date   CESAREAN SECTION  09/20/2020   Procedure: CESAREAN SECTION;  Surgeon: Christeen Douglas, MD;  Location: [facility] ORS;  Service: Obstetrics;;   Family History: History reviewed. No pertinent family history. Family Psychiatric  History: Unknown at this time. Tobacco Screening:  Social History   Tobacco Use  Smoking Status Never  Smokeless Tobacco Never    BH Tobacco Counseling     Are you interested in Tobacco Cessation Medications?  N/A, patient does not use tobacco products Counseled patient on smoking cessation:  N/A, patient does not use tobacco products Reason Tobacco Screening Not Completed: No value filed.       Social History:  Social History   Substance and Sexual Activity  Alcohol Use Yes   Comment: pt reports drinking once a month     Social History   Substance and Sexual Activity  Drug Use No    Additional Social History: Marital status: Long term relationship Long term relationship, how long?: several years What types of issues is patient dealing with in the relationship?: denies Additional relationship information: denies Are you sexually active?: Yes What is your sexual orientation?: heterosexual Does patient have children?: Yes How many children?: 1 How is patient's relationship with their children?: 2 yrs old                         Allergies:   Allergies  Allergen Reactions   Coffee Flavor Anaphylaxis   Latex Rash   Lab Results:  No results found for this or any previous visit (from  the past 48 hour(s)).   Blood Alcohol level:  Lab Results  Component Value Date   ETH <10 04/17/2023    Metabolic Disorder Labs:  Lab Results  Component Value Date   HGBA1C 5.3 02/17/2022   MPG 105 02/17/2022   MPG 125.5 09/19/2020   No results found for: "PROLACTIN" Lab Results  Component Value Date   CHOL 183 04/17/2023   TRIG 90 04/17/2023   HDL 43 04/17/2023   CHOLHDL 4.3 04/17/2023   VLDL 18 04/17/2023   LDLCALC 122 (H) 04/17/2023    Current Medications: Current Facility-Administered Medications  Medication Dose Route Frequency Provider Last Rate Last Admin   acetaminophen (TYLENOL) tablet 1,000 mg  1,000 mg Oral Q6H PRN Ardis Hughs, NP   1,000 mg at 04/20/23 2132   ALPRAZolam (XANAX) tablet 1 mg  1 mg Oral Daily PRN Ardis Hughs, NP   1 mg at 04/18/23 2002   alum & mag hydroxide-simeth (MAALOX/MYLANTA) 200-200-20 MG/5ML suspension 30 mL  30 mL Oral Q4H PRN Ardis Hughs, NP  diphenhydrAMINE (BENADRYL) capsule 50 mg  50 mg Oral TID PRN Ardis Hughs, NP       Or   diphenhydrAMINE (BENADRYL) injection 50 mg  50 mg Intramuscular TID PRN Ardis Hughs, NP       DULoxetine (CYMBALTA) DR capsule 30 mg  30 mg Oral Daily Rex Kras, MD   30 mg at 04/21/23 1610   gabapentin (NEURONTIN) capsule 300 mg  300 mg Oral TID Rex Kras, MD   300 mg at 04/21/23 0806   haloperidol (HALDOL) tablet 5 mg  5 mg Oral TID PRN Ardis Hughs, NP       Or   haloperidol lactate (HALDOL) injection 5 mg  5 mg Intramuscular TID PRN Ardis Hughs, NP       hydrOXYzine (ATARAX) tablet 25 mg  25 mg Oral TID PRN Ardis Hughs, NP   25 mg at 04/21/23 0806   LORazepam (ATIVAN) tablet 2 mg  2 mg Oral TID PRN Ardis Hughs, NP       Or   LORazepam (ATIVAN) injection 2 mg  2 mg Intramuscular TID PRN Ardis Hughs, NP       magnesium hydroxide (MILK OF MAGNESIA) suspension 30 mL  30 mL Oral Daily PRN Ardis Hughs, NP        risperiDONE (RISPERDAL) tablet 1 mg  1 mg Oral QHS Rex Kras, MD   1 mg at 04/20/23 2132   traZODone (DESYREL) tablet 50 mg  50 mg Oral QHS PRN Ardis Hughs, NP       PTA Medications: Medications Prior to Admission  Medication Sig Dispense Refill Last Dose   acetaminophen (TYLENOL) 500 MG tablet Take 1,000 mg by mouth every 6 (six) hours as needed (For neck pain).      ALPRAZolam (XANAX) 1 MG tablet Take 1 mg by mouth daily as needed for anxiety.      busPIRone (BUSPAR) 15 MG tablet Take 15 mg by mouth 3 (three) times daily.      cariprazine (VRAYLAR) 1.5 MG capsule Take 3 mg by mouth at bedtime.      FLUoxetine (PROZAC) 40 MG capsule Take 40 mg by mouth every morning.      levonorgestrel (LILETTA, 52 MG,) 20.1 MCG/DAY IUD 1 each by Intrauterine route once.       Musculoskeletal: Strength & Muscle Tone: within normal limits Gait & Station: normal Patient leans: N/A            Psychiatric Specialty Exam:  Presentation  General Appearance:  Appropriate for Environment  Eye Contact: Fair  Speech: Clear and Coherent  Speech Volume: Decreased  Handedness: Right   Mood and Affect  Mood: Anxious; Depressed  Affect: Appropriate   Thought Process  Thought Processes: Coherent  Duration of Psychotic Symptoms:N/A Past Diagnosis of Schizophrenia or Psychoactive disorder: No  Descriptions of Associations:Intact  Orientation:Full (Time, Place and Person)  Thought Content:Logical  Hallucinations:Hallucinations: None  Ideas of Reference:None  Suicidal Thoughts:Suicidal Thoughts: No  Homicidal Thoughts:Homicidal Thoughts: No   Sensorium  Memory: Immediate Fair; Recent Fair; Remote Fair  Judgment: Fair  Insight: Fair   Art therapist  Concentration: Fair  Attention Span: Fair  Recall: [employer] of Knowledge: Fair  Language: Fair   Psychomotor Activity  Psychomotor Activity: Psychomotor Activity:  Normal   Assets  Assets: Communication Skills; Desire for Improvement; Housing   Sleep  Sleep: Sleep: Good    Physical Exam: Physical Exam Vitals and nursing note  reviewed.  Constitutional:      Appearance: Normal appearance. She is normal weight.  HENT:     Head: Normocephalic.  Neurological:     General: No focal deficit present.     Mental Status: She is alert and oriented to person, place, and time. Mental status is at baseline.    Review of Systems  Psychiatric/Behavioral:  Positive for depression and suicidal ideas. The patient is nervous/anxious.   All other systems reviewed and are negative.  Blood pressure (!) 107/59, pulse (!) 121, temperature 98.6 F (37 C), temperature source Oral, resp. rate 18, height 5\' 2"  (1.575 m), weight 86.2 kg, SpO2 99 %, unknown if currently breastfeeding. Body mass index is 34.75 kg/m.  Treatment Plan Summary: Daily contact with patient to assess and evaluate symptoms and progress in treatment and Medication management  Observation Level/Precautions:  15 minute checks  Laboratory:   As indicated  Psychotherapy:    Medications:    Consultations:    Discharge Concerns:    Estimated LOS:  Other:    PLAN: Safety and Monitoring:             --  Voluntary admission to inpatient psychiatric unit for safety, stabilization and treatment             -- Daily contact with patient to assess and evaluate symptoms and progress in treatment             -- Patient's case to be discussed in multi-disciplinary team meeting             -- Observation Level : q15 minute checks             -- Vital signs:  q12 hours             -- Precautions: suicide, elopement, and assault   2. Psychiatric Diagnoses and Treatment:               --  The risks/benefits/side-effects/alternatives to this medication were discussed in detail with the patient and time was given for questions. The patient consents to medication trial.  -- FDA continue home  medications until establishing contact with a psychiatrist and consider adjustments as indicated.             -- Metabolic profile and EKG monitoring obtained while on an atypical antipsychotic (BMI: Lipid Panel: HbgA1c: QTc:) as needed.             -- Encouraged patient to participate in unit milieu and in scheduled group therapies              -- [place] Term Goals: Ability to identify changes in lifestyle to reduce recurrence of condition will improve, Ability to verbalize feelings will improve, Ability to demonstrate self-control will improve, and Ability to identify triggers associated with substance abuse/mental health issues will improve             -- Long Term Goals: Improvement in symptoms so as ready for discharge                3. Medical Issues Being Addressed:              Tobacco Use Disorder             -- Nicotine patch 21mg /24 hours ordered             -- Smoking cessation encouraged Restarted home medications that include: -Discontinue BuSpar.  Begin gabapentin 300 mg p.o. 3 times daily. -Discontinued  Vraylar 3 mg at bedtime.  Started on Risperdal 1 mg at bedtime. -Discontinued Prozac.  Increased Cymbalta to 30 mg a day.  4. Discharge Planning:              -- Social work and case management to assist with discharge planning and identification of hospital follow-up needs prior to discharge             -- Estimated LOS: Possible discharge by Monday, 04/25/2023             -- Discharge Concerns: Need to establish a safety plan; Medication compliance and effectiveness             -- Discharge Goals: Return home with outpatient referrals for mental health follow-up including medication management/psychotherapy     Physician Treatment Plan for Primary Diagnosis: MDD (major depressive disorder), recurrent episode, severe (HCC) Long Term Goal(s): Improvement in symptoms so as ready for discharge  [place] Term Goals: Ability to verbalize feelings will improve, Ability to disclose and  discuss suicidal ideas, Ability to demonstrate self-control will improve, and Ability to identify and develop effective coping behaviors will improve  Physician Treatment Plan for Secondary Diagnosis: Principal Problem:   MDD (major depressive disorder), recurrent episode, severe (HCC)  Long Term Goal(s): Improvement in symptoms so as ready for discharge  [place] Term Goals: Ability to demonstrate self-control will improve, Ability to identify and develop effective coping behaviors will improve, and Compliance with prescribed medications will improve  I certify that inpatient services furnished can reasonably be expected to improve the patient's condition.    Rex Kras, MD 4/26/202411:10 AM Total Time Spent in Direct Patient Care:  I personally spent 45 minutes on the unit in direct patient care. The direct patient care time included face-to-face time with the patient, reviewing the patient's chart, communicating with other professionals, and coordinating care. Greater than 50% of this time was spent in counseling or coordinating care with the patient regarding goals of hospitalization, psycho-education, and discharge planning needs.   Rulon Eisenmenger ZOXW,[id number],[id number] Psychiatrist Patient ID: Alan Mulder, female   DOB: February 17, 1991, 32 y.o.   MRN: [id number] Patient ID: Alan Mulder, female   DOB: 10/31/1991, 31 y.o.   MRN: [id number] Patient ID: Alan Mulder, female   DOB: February 18, 1991, 32 y.o.   MRN: [id number]

## 2023-04-21 NOTE — Progress Notes (Signed)
Patient reports anxiety 7/10 and depression 3/10. Denies SI/HI/AVH. Stated goals are to be more social and to get out of the room. Patient is active in the milieu and interacts pleasantly. Patient received PRN hydroxyzine and contracts for safety.   Patient requests provider to call her mother at [phone]   04/21/23 0806  Psych Admission Type (Psych Patients Only)  Admission Status Voluntary  Psychosocial Assessment  Patient Complaints Anxiety;Depression  Eye Contact Fair  Facial Expression Flat  Affect Anxious;Depressed  Speech Soft  Interaction Assertive  Motor Activity Other (Comment) (WDL)  Appearance/Hygiene Unremarkable  Behavior Characteristics Cooperative;Calm  Mood Depressed;Anxious;Pleasant  Thought Process  Coherency WDL  Content WDL  Delusions None reported or observed  Perception WDL  Hallucination None reported or observed  Judgment Impaired  Confusion None  Danger to Self  Current suicidal ideation? Denies  Agreement Not to Harm Self Yes  Description of Agreement verbal  Danger to Others  Danger to Others None reported or observed

## 2023-04-21 NOTE — Progress Notes (Signed)
   04/21/23 0557  15 Minute Checks  Location Bedroom  Visual Appearance Calm  Behavior Sleeping  Sleep (Behavioral Health Patients Only)  Calculate sleep? (Click Yes once per 24 hr at 0600 safety check) Yes  Documented sleep last 24 hours 7.5

## 2023-04-22 DIAGNOSIS — F332 Major depressive disorder, recurrent severe without psychotic features: Secondary | ICD-10-CM

## 2023-04-22 MED ORDER — GABAPENTIN 300 MG PO CAPS: 300.0000 mg | ORAL_CAPSULE | Freq: Three times a day (TID) | ORAL | 0 refills | Status: DC

## 2023-04-22 MED ORDER — RISPERIDONE 1 MG PO TABS: 1.0000 mg | ORAL_TABLET | Freq: Every day | ORAL | 0 refills | Status: DC

## 2023-04-22 MED ORDER — DULOXETINE HCL 30 MG PO CPEP: 30.0000 mg | ORAL_CAPSULE | Freq: Every day | ORAL | 0 refills | Status: AC

## 2023-04-22 NOTE — Group Note (Signed)
Date:  04/22/2023 Time:  10:34 AM  Group Topic/Focus:  Goals Group:   The focus of this group is to help patients establish daily goals to achieve during treatment and discuss how the patient can incorporate goal setting into their daily lives to aide in recovery. Orientation:   The focus of this group is to educate the patient on the purpose and policies of crisis stabilization and provide a format to answer questions about their admission.  The group details unit policies and expectations of patients while admitted.    Participation Level:  Active  Participation Quality:  Appropriate  Affect:  Appropriate  Cognitive:  Appropriate  Insight: Appropriate  Engagement in Group:  Engaged  Modes of Intervention:  Discussion, Orientation, and Rapport Building  Additional Comments:   Pt attended and participated in the Orientation/Goals group.  Edmund Hilda Arlyn Bumpus 04/22/2023, 10:34 AM

## 2023-04-22 NOTE — Discharge Summary (Signed)
Physician Discharge Summary Note  Patient:  Angela Bryan is an 32 y.o., female MRN:  [id number] DOB:  1991/05/13 Patient phone:  [phone] (home)  Patient address:   [address] [place] [address],  Total Time spent with patient: 30 minutes  Date of Admission:  04/17/2023 Date of Discharge: 04/22/2023  Reason for Admission:  The patient is a 32 year old Caucasian female who was admitted to [facility]  on a voluntary status with symptoms of depression and increasing suicidal ideations with a plan to overdose on her medications   Principal Problem: MDD (major depressive disorder), recurrent episode, severe (HCC) Discharge Diagnoses: Principal Problem:   MDD (major depressive disorder), recurrent episode, severe (HCC)   Past Psychiatric History: Please see H&P  Past Medical History:  Past Medical History:  Diagnosis Date   Allergy    Anxiety    Asthma    Depression    Diabetes mellitus without complication (HCC)    Headache    Mental disorder    Pelvic inflammatory disease 02/06/2017   PONV (postoperative nausea and vomiting)     Past Surgical History:  Procedure Laterality Date   CESAREAN SECTION  09/20/2020   Procedure: CESAREAN SECTION;  Surgeon: Christeen Douglas, MD;  Location: [facility] ORS;  Service: Obstetrics;;   Family History: History reviewed. No pertinent family history. Family Psychiatric  History: Please see H&P Social History:  Social History   Substance and Sexual Activity  Alcohol Use Yes   Comment: pt reports drinking once a month     Social History   Substance and Sexual Activity  Drug Use No    Social History   Socioeconomic History   Marital status: Single    Spouse name: Not on file   Number of children: Not on file   Years of education: Not on file   Highest education level: Not on file  Occupational History   Not on file  Tobacco Use   Smoking status: Never   Smokeless tobacco: Never  Substance and Sexual Activity    Alcohol use: Yes    Comment: pt reports drinking once a month   Drug use: No   Sexual activity: Yes    Birth control/protection: I.U.D.  Other Topics Concern   Not on file  Social History Narrative   Not on file   Social Determinants of Health   Financial Resource Strain: Not on file  Food Insecurity: No Food Insecurity (04/17/2023)   Hunger Vital Sign    Worried About Running Out of Food in the Last Year: Never true    Ran Out of Food in the Last Year: Never true  Transportation Needs: No Transportation Needs (04/17/2023)   PRAPARE - Administrator, Civil Service (Medical): No    Lack of Transportation (Non-Medical): No  Physical Activity: Not on file  Stress: Not on file  Social Connections: Not on file    Hospital Course:  During the patient's hospitalization, patient had extensive initial psychiatric evaluation, and follow-up psychiatric evaluations every day.  Psychiatric diagnoses provided upon initial assessment: Major depression recurrent with suicidal ideations and obsessive-compulsive features.  Patient's psychiatric medications were adjusted on admission: Her home medications were gradually tapered off.  During the hospitalization, other adjustments were made to the patient's psychiatric medication regimen: Patient was placed on duloxetine that was titrated to 30 mg a day, gabapentin 300 mg 3 times a day and Risperdal 1 mg at bedtime.  Patient's care was discussed during the interdisciplinary team  meeting every day during the hospitalization.  The patient denied having side effects to prescribed psychiatric medication.  Gradually, patient started adjusting to milieu. The patient was evaluated each day by a clinical provider to ascertain response to treatment. Improvement was noted by the patient's report of decreasing symptoms, improved sleep and appetite, affect, medication tolerance, behavior, and participation in unit programming.  Patient was asked  each day to complete a self inventory noting mood, mental status, pain, new symptoms, anxiety and concerns.    Symptoms were reported as significantly decreased or resolved completely by discharge.   On day of discharge, the patient reports that their mood is stable. The patient denied having suicidal thoughts for more than 48 hours prior to discharge.  Patient denies having homicidal thoughts.  Patient denies having auditory hallucinations.  Patient denies any visual hallucinations or other symptoms of psychosis. The patient was motivated to continue taking medication with a goal of continued improvement in mental health.   The patient reports their target psychiatric symptoms of depression and obsessions responded well to the psychiatric medications, and the patient reports overall benefit other psychiatric hospitalization. Supportive psychotherapy was provided to the patient. The patient also participated in regular group therapy while hospitalized. Coping skills, problem solving as well as relaxation therapies were also part of the unit programming.  Labs were reviewed with the patient, and abnormal results were discussed with the patient.  The patient is able to verbalize their individual safety plan to this provider.  # It is recommended to the patient to continue psychiatric medications as prescribed, after discharge from the hospital.    # It is recommended to the patient to follow up with your outpatient psychiatric provider and PCP.  # It was discussed with the patient, the impact of alcohol, drugs, tobacco have been there overall psychiatric and medical wellbeing, and total abstinence from substance use was recommended the patient.ed.  # Prescriptions provided or sent directly to preferred pharmacy at discharge. Patient agreeable to plan. Given opportunity to ask questions. Appears to feel comfortable with discharge.    # In the event of worsening symptoms, the patient is instructed to  call the crisis hotline, 911 and or go to the nearest ED for appropriate evaluation and treatment of symptoms. To follow-up with primary care provider for other medical issues, concerns and or health care needs  # Patient was discharged home to family with a plan to follow up as noted below.   Physical Findings: AIMS:  , ,  ,  ,    CIWA:    COWS:     Musculoskeletal: Strength & Muscle Tone: within normal limits Gait & Station: normal Patient leans: N/A   Psychiatric Specialty Exam:  Presentation  General Appearance:  Appropriate for Environment  Eye Contact: Fair  Speech: Normal Rate  Speech Volume: Normal  Handedness: Right   Mood and Affect  Mood: Euthymic  Affect: Appropriate   Thought Process  Thought Processes: Coherent  Descriptions of Associations:Intact  Orientation:Full (Time, Place and Person)  Thought Content:Logical  History of Schizophrenia/Schizoaffective disorder:No  Duration of Psychotic Symptoms:N/A  Hallucinations:Hallucinations: None  Ideas of Reference:None  Suicidal Thoughts:Suicidal Thoughts: No  Homicidal Thoughts:Homicidal Thoughts: No   Sensorium  Memory: Immediate Fair; Remote Fair; Recent Fair  Judgment: Fair  Insight: Fair   Art therapist  Concentration: Fair  Attention Span: Fair  Recall: [employer] of Knowledge: Fair  Language: Fair   Psychomotor Activity  Psychomotor Activity: Psychomotor Activity: Normal  Assets  Assets: Manufacturing systems engineer; Desire for Improvement; Housing   Sleep  Sleep: Sleep: Good Number of Hours of Sleep: 7.5    Physical Exam: Physical Exam Vitals and nursing note reviewed.  Constitutional:      Appearance: Normal appearance.  Neurological:     General: No focal deficit present.     Mental Status: She is alert and oriented to person, place, and time. Mental status is at baseline.  Psychiatric:        Attention and Perception: Attention  normal.        Mood and Affect: Mood normal.        Behavior: Behavior normal.        Thought Content: Thought content normal.        Cognition and Memory: Cognition normal.        Judgment: Judgment normal.    Review of Systems  Psychiatric/Behavioral: Negative.    All other systems reviewed and are negative.  Blood pressure 102/80, pulse (!) 115, temperature 98.4 F (36.9 C), temperature source Oral, resp. rate 18, height 5\' 2"  (1.575 m), weight 86.2 kg, SpO2 100 %, unknown if currently breastfeeding. Body mass index is 34.75 kg/m.   Social History   Tobacco Use  Smoking Status Never  Smokeless Tobacco Never   Tobacco Cessation:  N/A, patient does not currently use tobacco products   Blood Alcohol level:  Lab Results  Component Value Date   ETH <10 04/17/2023    Metabolic Disorder Labs:  Lab Results  Component Value Date   HGBA1C 5.3 02/17/2022   MPG 105 02/17/2022   MPG 125.5 09/19/2020   No results found for: "PROLACTIN" Lab Results  Component Value Date   CHOL 183 04/17/2023   TRIG 90 04/17/2023   HDL 43 04/17/2023   CHOLHDL 4.3 04/17/2023   VLDL 18 04/17/2023   LDLCALC 122 (H) 04/17/2023    See Psychiatric Specialty Exam and Suicide Risk Assessment completed by Attending Physician prior to discharge.  Discharge destination:  Home  Is patient on multiple antipsychotic therapies at discharge:  No   Has Patient had three or more failed trials of antipsychotic monotherapy by history:  No  Recommended Plan for Multiple Antipsychotic Therapies: NA  Discharge Instructions     Diet - low sodium heart healthy   Complete by: As directed    Increase activity slowly   Complete by: As directed       Allergies as of 04/22/2023       Reactions   Coffee Flavor Anaphylaxis   Latex Rash        Medication List     STOP taking these medications    acetaminophen 500 MG tablet Commonly known as: TYLENOL   ALPRAZolam 1 MG tablet Commonly known as:  XANAX   busPIRone 15 MG tablet Commonly known as: BUSPAR   FLUoxetine 40 MG capsule Commonly known as: PROZAC   Vraylar 1.5 MG capsule Generic drug: cariprazine       TAKE these medications      Indication  DULoxetine 30 MG capsule Commonly known as: CYMBALTA Take 1 capsule (30 mg total) by mouth daily. Start taking on: April 23, 2023  Indication: Major Depressive Disorder   gabapentin 300 MG capsule Commonly known as: NEURONTIN Take 1 capsule (300 mg total) by mouth 3 (three) times daily.  Indication: Social Anxiety Disorder   Liletta (52 MG) 20.1 MCG/DAY Iud IUD Generic drug: levonorgestrel 1 each by Intrauterine route once.  Indication: Birth Control  Treatment   risperiDONE 1 MG tablet Commonly known as: RISPERDAL Take 1 tablet (1 mg total) by mouth at bedtime.  Indication: Obsessive Compulsive Disorder        Follow-up Information     [facility]. Go on 04/26/2023.   Why: You have a medication management appointment with this provider on Wednesday 04/26/2023 @ 10:40 AM and a therapy appointment not until July 1st @ 9:00 AM. Please reach out to the provider after you are DC to see if they can move up your therapy appointment to a sooner date. Contact information: [phone] Fax: [phone]  [address] [place], [place] [address]                Follow-up recommendations:  Activity:  As tolerated  Comments: The patient had significant improvement of her symptoms on the current medication regimen.  Her psychiatrist was contacted who is in agreement with the changes in medications.  Patient was on Prozac, Vraylar and BuSpar which had helped her in the past but during the recent episode her symptoms had worsened and she was having increased obsessions about self-harm and also harm to her child.  Given her past history of obsessive-compulsive features and postpartum depression.  The patient was tapered  off the Prozac and was placed on gabapentin, Cymbalta and Risperdal.  She responded well.  Patient's mother is willing to monitor her upon discharge and be available for her.  She has a safety plan in place and has outpatient follow-up appointments scheduled. Prognosis is fair to guarded.  Signed: Rex Kras, MD 04/22/2023, 10:20 AM

## 2023-04-22 NOTE — BHH Suicide Risk Assessment (Signed)
[facility] Discharge Suicide Risk Assessment   Principal Problem: MDD (major depressive disorder), recurrent episode, severe (HCC) Discharge Diagnoses: Principal Problem:   MDD (major depressive disorder), recurrent episode, severe (HCC)   Total Time spent with patient: 30 minutes  Musculoskeletal: Strength & Muscle Tone: within normal limits Gait & Station: normal Patient leans: N/A  Psychiatric Specialty Exam  Presentation  General Appearance:  Appropriate for Environment  Eye Contact: Fair  Speech: Clear and Coherent  Speech Volume: Decreased  Handedness: Right   Mood and Affect  Mood: Anxious; Depressed  Duration of Depression Symptoms: Greater than two weeks  Affect: Appropriate   Thought Process  Thought Processes: Coherent  Descriptions of Associations:Intact  Orientation:Full (Time, Place and Person)  Thought Content:Logical  History of Schizophrenia/Schizoaffective disorder:No  Duration of Psychotic Symptoms:N/A  Hallucinations:Hallucinations: None  Ideas of Reference:None  Suicidal Thoughts:Suicidal Thoughts: No  Homicidal Thoughts:Homicidal Thoughts: No   Sensorium  Memory: Immediate Fair; Recent Fair; Remote Fair  Judgment: Fair  Insight: Fair   Art therapist  Concentration: Fair  Attention Span: Fair  Recall: [employer] of Knowledge: Fair  Language: Fair   Psychomotor Activity  Psychomotor Activity: Psychomotor Activity: Normal   Assets  Assets: Communication Skills; Desire for Improvement; Housing   Sleep  Sleep: Sleep: Good   Physical Exam: Physical Exam Constitutional:      Appearance: Normal appearance.  Neurological:     General: No focal deficit present.     Mental Status: She is alert and oriented to person, place, and time. Mental status is at baseline.  Psychiatric:        Mood and Affect: Mood normal.        Behavior: Behavior normal.        Thought Content: Thought content  normal.        Judgment: Judgment normal.    Review of Systems  Psychiatric/Behavioral: Negative.    All other systems reviewed and are negative.  Blood pressure 102/80, pulse (!) 115, temperature 98.4 F (36.9 C), temperature source Oral, resp. rate 18, height 5\' 2"  (1.575 m), weight 86.2 kg, SpO2 100 %, unknown if currently breastfeeding. Body mass index is 34.75 kg/m.  Mental Status Per Nursing Assessment::   On Admission:  Suicidal ideation indicated by patient, Belief that plan would result in death  Demographic Factors:  Caucasian  Loss Factors: NA  Historical Factors: NA  Risk Reduction Factors:   Responsible for children under 41 years of age, Sense of responsibility to family, Living with another person, especially a relative, Positive social support, Positive therapeutic relationship, and Positive coping skills or problem solving skills  Continued Clinical Symptoms:  Depression:   Impulsivity Obsessive-Compulsive Disorder  Cognitive Features That Contribute To Risk:  None    Suicide Risk:  Mild:  Suicidal ideation of limited frequency, intensity, duration, and specificity.  There are no identifiable plans, no associated intent, mild dysphoria and related symptoms, good self-control (both objective and subjective assessment), few other risk factors, and identifiable protective factors, including available and accessible social support.   Follow-up Information     [facility]. Go on 04/26/2023.   Why: You have a medication management appointment with this provider on Wednesday 04/26/2023 @ 10:40 AM and a therapy appointment not until July 1st @ 9:00 AM. Please reach out to the provider after you are DC to see if they can move up your therapy appointment to a sooner date. Contact information: [phone] Fax: [phone]  [address] [place], [place] [address]                Plan Of Care/Follow-up  recommendations:  Activity:  As tolerated  Rex Kras, MD 04/22/2023, 10:17 AM

## 2023-04-22 NOTE — Progress Notes (Signed)
  [facility] Adult Case Management Discharge Plan :  Will you be returning to the same living situation after discharge:  Yes,  home w/ mother At discharge, do you have transportation home?: Yes,  mother to pick pt up Do you have the ability to pay for your medications: Yes,  has insurance  Release of information consent forms completed and in the chart;  Patient's signature needed at discharge.  Patient to Follow up at:  Follow-up Information     [facility]. Go on 04/26/2023.   Why: You have a medication management appointment with this provider on Wednesday 04/26/2023 @ 10:40 AM and a therapy appointment not until July 1st @ 9:00 AM. Please reach out to the provider after you are DC to see if they can move up your therapy appointment to a sooner date. Contact information: [phone] Fax: [phone]  [address] [place], [place] [address]                Next level of care provider has access to [facility] Link:no  Safety Planning and Suicide Prevention discussed: Yes,  with mother     Has patient been referred to the Quitline?: N/A patient is not a smoker  Patient has been referred for addiction treatment: N/A  Otelia Santee, LCSW 04/22/2023, 10:17 AM

## 2023-04-22 NOTE — Progress Notes (Signed)
Pt discharged at this time. Pt left facility with her mother. Pt removed all belongings and verbalized understanding of medications and follow up care. Pt denies SI/HI/self harm thoughts as well as AVH.

## 2023-04-22 NOTE — Progress Notes (Signed)
   04/22/23 0000  Psych Admission Type (Psych Patients Only)  Admission Status Voluntary  Psychosocial Assessment  Patient Complaints Anxiety  Eye Contact Fair  Facial Expression Animated  Affect Appropriate to circumstance  Speech Logical/coherent  Interaction Assertive  Motor Activity Slow  Appearance/Hygiene Unremarkable  Behavior Characteristics Appropriate to situation;Cooperative  Mood Pleasant  Thought Process  Coherency WDL  Content WDL  Delusions None reported or observed  Perception WDL  Hallucination None reported or observed  Judgment Poor  Confusion None  Danger to Self  Current suicidal ideation? Denies  Agreement Not to Harm Self Yes  Description of Agreement verbal  Danger to Others  Danger to Others None reported or observed

## 2023-04-26 DIAGNOSIS — F331 Major depressive disorder, recurrent, moderate: Secondary | ICD-10-CM | POA: Diagnosis not present

## 2023-04-26 DIAGNOSIS — F410 Panic disorder [episodic paroxysmal anxiety] without agoraphobia: Secondary | ICD-10-CM | POA: Diagnosis not present

## 2023-04-26 DIAGNOSIS — Z1331 Encounter for screening for depression: Secondary | ICD-10-CM | POA: Diagnosis not present

## 2023-05-17 DIAGNOSIS — F410 Panic disorder [episodic paroxysmal anxiety] without agoraphobia: Secondary | ICD-10-CM | POA: Diagnosis not present

## 2023-05-17 DIAGNOSIS — F331 Major depressive disorder, recurrent, moderate: Secondary | ICD-10-CM | POA: Diagnosis not present

## 2023-05-26 DIAGNOSIS — F419 Anxiety disorder, unspecified: Secondary | ICD-10-CM | POA: Diagnosis not present

## 2023-05-26 DIAGNOSIS — F331 Major depressive disorder, recurrent, moderate: Secondary | ICD-10-CM | POA: Diagnosis not present

## 2023-05-30 DIAGNOSIS — F419 Anxiety disorder, unspecified: Secondary | ICD-10-CM | POA: Diagnosis not present

## 2023-05-30 DIAGNOSIS — F410 Panic disorder [episodic paroxysmal anxiety] without agoraphobia: Secondary | ICD-10-CM | POA: Diagnosis not present

## 2023-05-30 DIAGNOSIS — F332 Major depressive disorder, recurrent severe without psychotic features: Secondary | ICD-10-CM | POA: Diagnosis not present

## 2023-06-09 DIAGNOSIS — F332 Major depressive disorder, recurrent severe without psychotic features: Secondary | ICD-10-CM | POA: Diagnosis not present

## 2023-06-09 DIAGNOSIS — Z79899 Other long term (current) drug therapy: Secondary | ICD-10-CM | POA: Diagnosis not present

## 2023-06-26 ENCOUNTER — Other Ambulatory Visit: Payer: BC Managed Care – PPO

## 2023-06-26 DIAGNOSIS — Z139 Encounter for screening, unspecified: Secondary | ICD-10-CM

## 2023-06-26 DIAGNOSIS — F419 Anxiety disorder, unspecified: Secondary | ICD-10-CM | POA: Diagnosis not present

## 2023-06-26 DIAGNOSIS — F331 Major depressive disorder, recurrent, moderate: Secondary | ICD-10-CM | POA: Diagnosis not present

## 2023-06-26 DIAGNOSIS — F332 Major depressive disorder, recurrent severe without psychotic features: Secondary | ICD-10-CM | POA: Diagnosis not present

## 2023-06-26 DIAGNOSIS — Z111 Encounter for screening for respiratory tuberculosis: Secondary | ICD-10-CM | POA: Diagnosis not present

## 2023-06-28 LAB — QUANTIFERON-TB GOLD PLUS
Mitogen-NIL: >10 IU/mL
NIL: 0.08 IU/mL
QuantiFERON-TB Gold Plus: NEGATIVE
TB1-NIL: 0 IU/mL
TB2-NIL: 0.01 IU/mL

## 2023-07-12 ENCOUNTER — Ambulatory Visit (INDEPENDENT_AMBULATORY_CARE_PROVIDER_SITE_OTHER): Payer: BC Managed Care – PPO

## 2023-07-12 DIAGNOSIS — Z23 Encounter for immunization: Secondary | ICD-10-CM

## 2023-07-17 NOTE — Progress Notes (Signed)
Pt came in today to get boostrix vaccine on L-upper arm. Pt tol well. Pt left ambulatory w/no c/o

## 2023-07-20 DIAGNOSIS — F331 Major depressive disorder, recurrent, moderate: Secondary | ICD-10-CM | POA: Diagnosis not present

## 2023-07-20 DIAGNOSIS — F410 Panic disorder [episodic paroxysmal anxiety] without agoraphobia: Secondary | ICD-10-CM | POA: Diagnosis not present

## 2023-08-01 DIAGNOSIS — F331 Major depressive disorder, recurrent, moderate: Secondary | ICD-10-CM | POA: Diagnosis not present

## 2023-08-01 DIAGNOSIS — F410 Panic disorder [episodic paroxysmal anxiety] without agoraphobia: Secondary | ICD-10-CM | POA: Diagnosis not present

## 2023-10-03 ENCOUNTER — Other Ambulatory Visit: Payer: Self-pay | Admitting: Certified Nurse Midwife

## 2023-10-03 DIAGNOSIS — N6323 Unspecified lump in the left breast, lower outer quadrant: Secondary | ICD-10-CM

## 2023-10-18 ENCOUNTER — Ambulatory Visit
Admission: RE | Admit: 2023-10-18 | Discharge: 2023-10-18 | Disposition: A | Discharge status: Home / Self Care | Payer: BC Managed Care – PPO | Source: Ambulatory Visit | Attending: Certified Nurse Midwife | Admitting: Certified Nurse Midwife

## 2023-10-18 DIAGNOSIS — N6323 Unspecified lump in the left breast, lower outer quadrant: Secondary | ICD-10-CM | POA: Insufficient documentation

## 2023-10-30 ENCOUNTER — Telehealth: Payer: Self-pay

## 2023-10-30 ENCOUNTER — Other Ambulatory Visit: Payer: Self-pay | Admitting: Family Medicine

## 2023-10-30 MED ORDER — ALBUTEROL SULFATE HFA 108 (90 BASE) MCG/ACT IN AERS: 2.0000 | INHALATION_SPRAY | Freq: Four times a day (QID) | RESPIRATORY_TRACT | 3 refills | Status: DC | PRN

## 2023-10-30 NOTE — Telephone Encounter (Signed)
Pt called stating that she is having a hacking cough, she thinks her asthma is flaring up. She states her inhaler is out of date and would like to know if she can get this refilled?

## 2023-10-31 ENCOUNTER — Telehealth: Payer: Self-pay

## 2023-11-01 ENCOUNTER — Encounter: Payer: Self-pay | Admitting: Emergency Medicine

## 2023-11-01 ENCOUNTER — Ambulatory Visit
Admission: EM | Admit: 2023-11-01 | Discharge: 2023-11-01 | Disposition: A | Discharge status: Home / Self Care | Payer: BC Managed Care – PPO

## 2023-11-01 ENCOUNTER — Ambulatory Visit: Payer: Self-pay | Admitting: Family Medicine

## 2023-11-01 DIAGNOSIS — J069 Acute upper respiratory infection, unspecified: Secondary | ICD-10-CM | POA: Diagnosis not present

## 2023-11-01 DIAGNOSIS — J4521 Mild intermittent asthma with (acute) exacerbation: Secondary | ICD-10-CM | POA: Diagnosis not present

## 2023-11-01 LAB — POC COVID19/FLU A&B COMBO
Covid Antigen, POC: NEGATIVE
Influenza A Antigen, POC: NEGATIVE
Influenza B Antigen, POC: NEGATIVE

## 2023-11-01 MED ORDER — PREDNISONE 20 MG PO TABS: 40.0000 mg | ORAL_TABLET | Freq: Every day | ORAL | 0 refills | Status: DC

## 2023-11-01 MED ORDER — PROMETHAZINE-DM 6.25-15 MG/5ML PO SYRP: 5.0000 mL | ORAL_SOLUTION | Freq: Four times a day (QID) | ORAL | 0 refills | Status: AC | PRN

## 2023-11-01 NOTE — ED Triage Notes (Signed)
Cough, runny nose, headache, cannot smell or taste, body aches.  Has taken 2 covid tests that were negative.  Symptoms x 3 days.

## 2023-11-01 NOTE — Telephone Encounter (Signed)
Reason for Disposition  [1] MILD difficulty breathing (e.g., minimal/no SOB at rest, SOB with walking, pulse <100) AND [2] still present when not coughing  Answer Assessment - Initial Assessment Questions 1. ONSET: "When did the cough begin?"      Few days ago 2. SEVERITY: "How bad is the cough today?"      Mild to Moderate in Severity 3. SPUTUM: "Describe the color of your sputum" (none, dry cough; clear, white, yellow, green) Dry cough    5. DIFFICULTY BREATHING: "Are you having difficulty breathing?" If Yes, ask: "How bad is it?" (e.g., mild, moderate, severe) Mild SOB when coughing continuously. MILD: No SOB at rest, mild SOB with walking, speaks normally in sentences, can lie down, no retractions, pulse < 100.      6. FEVER: "Do you have a fever?" If Yes, ask: "What is your temperature, how was it measured, and when did it start?"     Denies 7. CARDIAC HISTORY: "Do you have any history of heart disease?" (e.g., heart attack, congestive heart failure)      Denies 8. LUNG HISTORY: "Do you have any history of lung disease?"  (e.g., pulmonary embolus, asthma, emphysema)    Asthma - only have albuterol inhaler 9. PE RISK FACTORS: "Do you have a history of blood clots?" (or: recent major surgery, recent prolonged travel, bedridden)     *No Answer*Denies 10. OTHER SYMPTOMS: "Do you have any other symptoms?" (e.g., runny nose, wheezing, chest pain)      *No Answer*Occasional Wheezing, Coughing  sometimes makes her vomit  Protocols used: Cough - Acute Non-Productive-A-AH

## 2023-11-01 NOTE — ED Provider Notes (Signed)
[facility]    CSN: [id number] Arrival date & time: 11/01/23  8841      History   Chief Complaint No chief complaint on file.   HPI Angela Bryan is a 32 y.o. female.   Patient presenting today with several day history of cough, runny nose, headache, body aches, loss of taste and smell, fatigue.  Denies chest pain, shortness of breath, abdominal pain, nausea vomiting or diarrhea.  So far trying over-the-counter cold and congestion medications and her albuterol inhaler for her asthma with mild temporary benefit.  Had a coughing fit before work this morning that caused her to vomit.    Past Medical History:  Diagnosis Date   Allergy    Anxiety    Asthma    Depression    Diabetes mellitus without complication (HCC)    Headache    Mental disorder    Pelvic inflammatory disease 02/06/2017   PONV (postoperative nausea and vomiting)     Patient Active Problem List   Diagnosis Date Noted   MDD (major depressive disorder), recurrent episode, severe (HCC) 04/17/2023   Viral illness 02/16/2023   Pre-eclampsia during pregnancy in third trimester, antepartum 09/19/2020   Gestational diabetes, diet controlled 09/19/2020   Supervision of high risk pregnancy, antepartum, third trimester 02/03/2020   GERD (gastroesophageal reflux disease) 05/14/2018   Generalized anxiety disorder 04/05/2018    Past Surgical History:  Procedure Laterality Date   CESAREAN SECTION  09/20/2020   Procedure: CESAREAN SECTION;  Surgeon: Christeen Douglas, MD;  Location: [facility] ORS;  Service: Obstetrics;;    OB History     Gravida  1   Para  1   Term  1   Preterm      AB      Living  1      SAB      IAB      Ectopic      Multiple  0   Live Births  1            Home Medications    Prior to Admission medications   Medication Sig Start Date End Date Taking? Authorizing Provider  albuterol (VENTOLIN HFA) 108 (90 Base) MCG/ACT inhaler Inhale 2 puffs into the  lungs every 6 (six) hours as needed for wheezing or shortness of breath. 10/30/23   Donita Brooks, MD  ALPRAZolam Prudy Feeler) 0.5 MG tablet Take 0.5 mg by mouth at bedtime as needed for anxiety.   Yes [provider]  busPIRone (BUSPAR) 5 MG tablet Take 5 mg by mouth 3 (three) times daily.   Yes [provider]  lithium carbonate 150 MG capsule Take 150 mg by mouth 3 (three) times daily with meals.   Yes [provider]  predniSONE (DELTASONE) 20 MG tablet Take 2 tablets (40 mg total) by mouth daily with breakfast. 11/01/23  Yes Particia Nearing, PA-C  promethazine-dextromethorphan (PROMETHAZINE-DM) 6.25-15 MG/5ML syrup Take 5 mLs by mouth 4 (four) times daily as needed. 11/01/23  Yes Particia Nearing, PA-C  DULoxetine (CYMBALTA) 30 MG capsule Take 1 capsule (30 mg total) by mouth daily. 04/23/23   Rex Kras, MD  levonorgestrel (LILETTA, 52 MG,) 20.1 MCG/DAY IUD 1 each by Intrauterine route once.    [provider]    Family History Family History  Problem Relation Age of Onset   Breast cancer Neg Hx     Social History Social History   Tobacco Use   Smoking status: Never   Smokeless  tobacco: Never  Substance Use Topics   Alcohol use: Yes    Comment: pt reports drinking once a month   Drug use: No     Allergies   Coffee flavor and Latex   Review of Systems Review of Systems Per HPI  Physical Exam Triage Vital Signs ED Triage Vitals  Encounter Vitals Group     BP 11/01/23 0958 125/72     Systolic BP Percentile --      Diastolic BP Percentile --      Pulse Rate 11/01/23 0958 (!) 129     Resp 11/01/23 0958 18     Temp 11/01/23 0958 98.5 F (36.9 C)     Temp Source 11/01/23 0958 Oral     SpO2 11/01/23 0958 97 %     Weight --      Height --      Head Circumference --      Peak Flow --      Pain Score 11/01/23 1000 0     Pain Loc --      Pain Education --      Exclude from Growth Chart --    No data  found.  Updated Vital Signs BP 125/72 (BP Location: Right Arm)   Pulse (!) 129   Temp 98.5 F (36.9 C) (Oral)   Resp 18   SpO2 97%   Visual Acuity Right Eye Distance:   Left Eye Distance:   Bilateral Distance:    Right Eye Near:   Left Eye Near:    Bilateral Near:     Physical Exam Vitals and nursing note reviewed.  Constitutional:      Appearance: Normal appearance.  HENT:     Head: Atraumatic.     Right Ear: Tympanic membrane and external ear normal.     Left Ear: Tympanic membrane and external ear normal.     Nose: Rhinorrhea present.     Mouth/Throat:     Mouth: Mucous membranes are moist.     Pharynx: Posterior oropharyngeal erythema present.  Eyes:     Extraocular Movements: Extraocular movements intact.     Conjunctiva/sclera: Conjunctivae normal.  Cardiovascular:     Rate and Rhythm: Normal rate and regular rhythm.     Heart sounds: Normal heart sounds.  Pulmonary:     Effort: Pulmonary effort is normal.     Breath sounds: Wheezing present. No rales.  Musculoskeletal:        General: Normal range of motion.     Cervical back: Normal range of motion and neck supple.  Skin:    General: Skin is warm and dry.  Neurological:     Mental Status: She is alert and oriented to person, place, and time.  Psychiatric:        Mood and Affect: Mood normal.        Thought Content: Thought content normal.      UC Treatments / Results  Labs (all labs ordered are listed, but only abnormal results are displayed) Labs Reviewed  POC COVID19/FLU A&B COMBO    EKG   Radiology No results found.  Procedures Procedures (including critical care time)  Medications Ordered in UC Medications - No data to display  Initial Impression / Assessment and Plan / UC Course  I have reviewed the triage vital signs and the nursing notes.  Pertinent labs & imaging results that were available during my care of the patient were reviewed by me and considered in my medical  decision making (  see chart for details).     COVID and flu negative, tachycardic in triage otherwise vital signs within normal limits.  She is overall well-appearing, suspect viral upper respiratory infection causing asthma exacerbation.  Treat with prednisone, Phenergan DM, albuterol as needed and supportive over-the-counter medications and home care.  Return for worsening symptoms.  Final Clinical Impressions(s) / UC Diagnoses   Final diagnoses:  Viral URI with cough  Mild intermittent asthma with acute exacerbation   Discharge Instructions   None    ED Prescriptions     Medication Sig Dispense Auth. Provider   predniSONE (DELTASONE) 20 MG tablet Take 2 tablets (40 mg total) by mouth daily with breakfast. 10 tablet Particia Nearing, PA-C   promethazine-dextromethorphan (PROMETHAZINE-DM) 6.25-15 MG/5ML syrup Take 5 mLs by mouth 4 (four) times daily as needed. 100 mL Particia Nearing, [place]      PDMP not reviewed this encounter.   Particia Nearing, [place] 11/01/23 1111

## 2023-11-01 NOTE — Telephone Encounter (Signed)
See note

## 2023-11-05 ENCOUNTER — Encounter: Payer: Self-pay | Admitting: Family Medicine

## 2023-11-07 ENCOUNTER — Ambulatory Visit: Payer: Self-pay | Admitting: Family Medicine

## 2023-11-07 NOTE — Telephone Encounter (Signed)
Copied from CRM [id number]. Topic: Clinical - Red Word Triage >> Nov 01, 2023  8:23 AM Angela Bryan wrote: Red Word that prompted transfer to Nurse Triage: Shortness Of breath / Blood   Complete  note   Chief Complaint:  Patient complaining of  shortness of Breath.  Pertinent Negatives: Patient denies  Disposition: [] ED /[x] Urgent Care (no appt availability in office) / [] Appointment(In office/virtual)/ []  [facility]/ [] Home Care/ [] Refused Recommended Disposition /[] [facility]/ []  Follow-up with PCP Additional Notes:  Reason for Disposition  [1] Coughed up blood AND [2] > 1 tablespoon (15 ml)   (Exception: Blood-tinged sputum.)  Protocols used: Cough - Acute Productive-A-AH

## 2023-11-13 ENCOUNTER — Ambulatory Visit: Payer: Self-pay | Admitting: Family Medicine

## 2023-11-13 ENCOUNTER — Ambulatory Visit: Payer: BC Managed Care – PPO

## 2023-11-13 ENCOUNTER — Ambulatory Visit
Admission: EM | Admit: 2023-11-13 | Discharge: 2023-11-13 | Disposition: A | Discharge status: Home / Self Care | Payer: BC Managed Care – PPO | Attending: Nurse Practitioner | Admitting: Nurse Practitioner

## 2023-11-13 DIAGNOSIS — R059 Cough, unspecified: Secondary | ICD-10-CM | POA: Diagnosis not present

## 2023-11-13 DIAGNOSIS — J4541 Moderate persistent asthma with (acute) exacerbation: Secondary | ICD-10-CM

## 2023-11-13 MED ORDER — PREDNISONE 20 MG PO TABS: 40.0000 mg | ORAL_TABLET | Freq: Every day | ORAL | 0 refills | Status: AC

## 2023-11-13 MED ORDER — AZITHROMYCIN 250 MG PO TABS: 250.0000 mg | ORAL_TABLET | Freq: Every day | ORAL | 0 refills | Status: AC

## 2023-11-13 MED ORDER — ALBUTEROL SULFATE (2.5 MG/3ML) 0.083% IN NEBU: 2.5000 mg | INHALATION_SOLUTION | Freq: Four times a day (QID) | RESPIRATORY_TRACT | 0 refills | Status: AC | PRN

## 2023-11-13 MED ORDER — HYDROCODONE BIT-HOMATROP MBR 5-1.5 MG/5ML PO SOLN: 5.0000 mL | Freq: Four times a day (QID) | ORAL | 0 refills | Status: AC | PRN

## 2023-11-13 NOTE — Telephone Encounter (Signed)
Chief Complaint: Increase coughing, SOB Symptoms: persistent productive cough, vomiting during coughing episodes. Frequency: with exertion Pertinent Negatives: Patient denies fevers, denies pregnancy Disposition: [] ED /[x] Urgent Care (no appt availability in office) / [] Appointment(In office/virtual)/ []  [facility]/ [] Home Care/ [] Refused Recommended Disposition /[] [place] [facility]/ []  Follow-up with PCP Additional Notes: Pt s/s include increase cough, increase SOB with exertion. Sputum yellow with cough. Utilizing OTC meds appropriately, recently seen in UC given rx for steroids-took as prescribed. Hx of asthma, denies intubation d/t asthma.  Denies fevers.  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 Denies long trips via plane/car, No travel outside of the [place]. Advised to continue to use OTC medications and increase fluid intake to help with coughing up sputum.  No clinic appts until Dec, advised to go to UC, pt agreeable.  Copied from CRM [id number]. Topic: Clinical - Red Word Triage >> Nov 13, 2023  4:29 PM Prudencio Pair wrote: Red Word that prompted transfer to Nurse Triage: Patient states she went to urgent care last week due to not being able to see Dr. Tanya Nones for upper respiratory infection. States she was given a week of steroids. Pt messaged Dr. Tanya Nones and he told her to schedule appt to come in. Next avail is Dec. 12th. Pt states she is coughing horribly and vomiting as well, along with shortness of breath and fatigue. Reason for Disposition  [1] MILD difficulty breathing (e.g., minimal/no SOB at rest, SOB with walking, pulse <100) AND [2] still present when not coughing  Answer Assessment - Initial Assessment Questions 1. ONSET: "When did the cough begin?"      2-3 weeks 2. SEVERITY: "How bad is the cough today?"      Moderate, 7/10 3. SPUTUM: "Describe the color of your sputum" (none, dry cough; clear, white, yellow, green)     Light  yellow, creamy 4. HEMOPTYSIS: "Are you coughing up any blood?" If so ask: "How much?" (flecks, streaks, tablespoons, etc.)     Denies 5. DIFFICULTY BREATHING: "Are you having difficulty breathing?" If Yes, ask: "How bad is it?" (e.g., mild, moderate, severe)    - MILD: No SOB at rest, mild SOB with walking, speaks normally in sentences, can lie down, no retractions, pulse < 100.    - MODERATE: SOB at rest, SOB with minimal exertion and prefers to sit, cannot lie down flat, speaks in phrases, mild retractions, audible wheezing, pulse 100-120.    - SEVERE: Very SOB at rest, speaks in single words, struggling to breathe, sitting hunched forward, retractions, pulse > 120      mild 6. FEVER: "Do you have a fever?" If Yes, ask: "What is your temperature, how was it measured, and when did it start?"     Denies 7. CARDIAC HISTORY: "Do you have any history of heart disease?" (e.g., heart attack, congestive heart failure)      Denies 8. LUNG HISTORY: "Do you have any history of lung disease?"  (e.g., pulmonary embolus, asthma, emphysema)     Asthma 9. PE RISK FACTORS: "Do you have a history of blood clots?" (or: recent major surgery, recent prolonged travel, bedridden)     Denies 10. OTHER SYMPTOMS: "Do you have any other symptoms?" (e.g., runny nose, wheezing, chest pain)       + runny nose, +CP with coughing, + mid back pain in evening 11. PREGNANCY: "Is there any chance you are pregnant?" "When was your last menstrual period?"  No 12. TRAVEL: "Have you traveled out of the country in the last month?" (e.g., travel history, exposures)       Denies  Protocols used: Cough - Acute Productive-A-AH

## 2023-11-13 NOTE — Discharge Instructions (Signed)
Chest x-ray is pending.  As discussed, you will be contacted once the results are received. You were given an injection of Solu-Medrol 80 mg.  Start prednisone on 11/14/2023. Take medication as prescribed. Increase fluids and allow for plenty of rest. Recommend over-the-counter Tylenol as needed for pain, fever, or general discomfort. Recommend using a humidifier in your bedroom at nighttime during sleep and sleeping elevated on pillows while cough symptoms persist. Recommend starting a daily allergy medication. Go to the emergency department immediately if you experience worsening cough with shortness of breath, difficulty breathing, or other concerns. Follow-up with your primary care physician as scheduled. Follow-up as needed.

## 2023-11-13 NOTE — ED Triage Notes (Signed)
Pt states cough and sob for the past 2 weeks.  States she was seen here 2 weeks ago for same finished  a round of steroids.

## 2023-11-13 NOTE — ED Provider Notes (Signed)
[facility]    CSN: [id number] Arrival date & time: 11/13/23  1719      History   Chief Complaint Chief Complaint  Patient presents with   Cough    HPI Angela Bryan is a 32 y.o. female.   The history is provided by the patient.   Patient presents for complaints of cough and shortness of breath for the past 2 weeks.  Patient states she was seen in this clinic approximately 2 weeks ago and was treated with steroids and Promethazine DM.  Patient states that she finished the medication, but cough appears to be worsening, and also become more persistent.  Patient with underlying history of asthma.  Reports that she currently is not on a maintenance inhaler, but has talked to her doctor about this.  Patient denies fever, chills, nasal congestion, runny nose, difficulty breathing, chest pain, abdominal pain, nausea, vomiting, or diarrhea.  Past Medical History:  Diagnosis Date   Allergy    Anxiety    Asthma    Depression    Diabetes mellitus without complication (HCC)    Headache    Mental disorder    Pelvic inflammatory disease 02/06/2017   PONV (postoperative nausea and vomiting)     Patient Active Problem List   Diagnosis Date Noted   MDD (major depressive disorder), recurrent episode, severe (HCC) 04/17/2023   Viral illness 02/16/2023   Pre-eclampsia during pregnancy in third trimester, antepartum 09/19/2020   Gestational diabetes, diet controlled 09/19/2020   Supervision of high risk pregnancy, antepartum, third trimester 02/03/2020   GERD (gastroesophageal reflux disease) 05/14/2018   Generalized anxiety disorder 04/05/2018    Past Surgical History:  Procedure Laterality Date   CESAREAN SECTION  09/20/2020   Procedure: CESAREAN SECTION;  Surgeon: Christeen Douglas, MD;  Location: [facility];  Service: Obstetrics;;    OB History     Gravida  1   Para  1   Term  1   Preterm      AB      Living  1      SAB      IAB      Ectopic       Multiple  0   Live Births  1            Home Medications    Prior to Admission medications   Medication Sig Start Date End Date Taking? Authorizing Provider  albuterol (PROVENTIL) (2.5 MG/3ML) 0.083% nebulizer solution Take 3 mLs (2.5 mg total) by nebulization every 6 (six) hours as needed for wheezing or shortness of breath. 11/13/23  Yes Leath-Warren, Sadie Haber, NP  azithromycin (ZITHROMAX) 250 MG tablet Take 1 tablet (250 mg total) by mouth daily. Take first 2 tablets together, then 1 every day until finished. 11/13/23  Yes Leath-Warren, Sadie Haber, NP  HYDROcodone bit-homatropine (HYCODAN) 5-1.5 MG/5ML syrup Take 5 mLs by mouth every 6 (six) hours as needed for cough. 11/13/23  Yes Leath-Warren, Sadie Haber, NP  predniSONE (DELTASONE) 20 MG tablet Take 2 tablets (40 mg total) by mouth daily with breakfast for 5 days. 11/13/23 11/18/23 Yes Leath-Warren, Sadie Haber, NP  ALPRAZolam Prudy Feeler) 0.5 MG tablet Take 0.5 mg by mouth at bedtime as needed for anxiety.    [provider]  busPIRone (BUSPAR) 5 MG tablet Take 5 mg by mouth 3 (three) times daily.    [provider]  DULoxetine (CYMBALTA) 30 MG capsule Take 1 capsule (30 mg total) by mouth daily. 04/23/23  Rex Kras, MD  levonorgestrel (LILETTA, 52 MG,) 20.1 MCG/DAY IUD 1 each by Intrauterine route once.    [provider]  lithium carbonate 150 MG capsule Take 150 mg by mouth 3 (three) times daily with meals.    [provider]  promethazine-dextromethorphan (PROMETHAZINE-DM) 6.25-15 MG/5ML syrup Take 5 mLs by mouth 4 (four) times daily as needed. 11/01/23   Particia Nearing, PA-C    Family History Family History  Problem Relation Age of Onset   Breast cancer Neg Hx     Social History Social History   Tobacco Use   Smoking status: Never   Smokeless tobacco: Never  Substance Use Topics   Alcohol use: Yes    Comment: pt reports drinking once a month   Drug use: No      Allergies   Coffee flavor and Latex   Review of Systems Review of Systems Per HPI  Physical Exam Triage Vital Signs ED Triage Vitals  Encounter Vitals Group     BP 11/13/23 1919 123/86     Systolic BP Percentile --      Diastolic BP Percentile --      Pulse Rate 11/13/23 1919 100     Resp 11/13/23 1919 16     Temp 11/13/23 1919 98.2 F (36.8 C)     Temp Source 11/13/23 1919 Oral     SpO2 11/13/23 1919 95 %     Weight --      Height --      Head Circumference --      Peak Flow --      Pain Score 11/13/23 1920 0     Pain Loc --      Pain Education --      Exclude from Growth Chart --    No data found.  Updated Vital Signs BP 123/86 (BP Location: Right Arm)   Pulse 100   Temp 98.2 F (36.8 C) (Oral)   Resp 16   SpO2 95%   Breastfeeding No   Visual Acuity Right Eye Distance:   Left Eye Distance:   Bilateral Distance:    Right Eye Near:   Left Eye Near:    Bilateral Near:     Physical Exam Vitals and nursing note reviewed.  Constitutional:      General: She is not in acute distress.    Appearance: Normal appearance.  HENT:     Head: Normocephalic.     Right Ear: Tympanic membrane, ear canal and external ear normal.     Left Ear: Tympanic membrane, ear canal and external ear normal.     Nose: Congestion present.     Right Turbinates: Enlarged and swollen.     Left Turbinates: Enlarged and swollen.     Right Sinus: No maxillary sinus tenderness or frontal sinus tenderness.     Left Sinus: No maxillary sinus tenderness or frontal sinus tenderness.     Mouth/Throat:     Lips: Pink.     Mouth: Mucous membranes are moist.     Pharynx: Oropharynx is clear. Uvula midline. Posterior oropharyngeal erythema and postnasal drip present. No oropharyngeal exudate or uvula swelling.     Comments: Cobblestoning present to posterior oropharynx  Eyes:     Extraocular Movements: Extraocular movements intact.     Conjunctiva/sclera: Conjunctivae normal.      Pupils: Pupils are equal, round, and reactive to light.  Cardiovascular:     Rate and Rhythm: Normal rate and regular rhythm.  Pulses: Normal pulses.     Heart sounds: Normal heart sounds.  Pulmonary:     Effort: Pulmonary effort is normal. No respiratory distress.     Breath sounds: Normal breath sounds. No stridor. No wheezing, rhonchi or rales.  Chest:     Chest wall: No tenderness.  Abdominal:     General: Bowel sounds are normal.     Palpations: Abdomen is soft.     Tenderness: There is no abdominal tenderness.  Musculoskeletal:     Cervical back: Normal range of motion.  Skin:    General: Skin is warm and dry.  Neurological:     General: No focal deficit present.     Mental Status: She is alert and oriented to person, place, and time.  Psychiatric:        Mood and Affect: Mood normal.        Behavior: Behavior normal.      UC Treatments / Results  Labs (all labs ordered are listed, but only abnormal results are displayed) Labs Reviewed - No data to display  EKG   Radiology DG Chest 2 View  Result Date: 11/13/2023 CLINICAL DATA:  Cough for 2-3 weeks, history of asthma. EXAM: CHEST - 2 VIEW COMPARISON:  None Available. FINDINGS: The heart size and mediastinal contours are within normal limits. Subsegmental atelectasis or scarring is noted in the mid right lung. No consolidation, effusion, or pneumothorax. No acute osseous abnormality. IMPRESSION: Mild subsegmental atelectasis or scarring in the mid right lung. Electronically Signed   By: Thornell Sartorius M.D.   On: 11/13/2023 22:13    Procedures Procedures (including critical care time)  Medications Ordered in UC Medications - No data to display  Initial Impression / Assessment and Plan / UC Course  I have reviewed the triage vital signs and the nursing notes.  Pertinent labs & imaging results that were available during my care of the patient were reviewed by me and considered in my medical decision making (see  chart for details).  Chest x-ray is pending.  Differential diagnoses include acute bronchitis versus asthma exacerbation.  Patient with worsening cough, which has become more persistent.  Will treat patient with azithromycin 250 mg to cover for possible lower respiratory infection, Hycodan cough syrup for persistent cough at bedtime, prednisone 40 mg at with bronchial inflammation, and albuterol nebulizer solution as needed for cough and wheezing.  Supportive care recommendations were provided and discussed with the patient to include fluids, rest, and use of a humidifier at nighttime.  Patient was advised to follow-up with her PCP to discuss a maintenance inhaler.  Patient was also given strict ER follow-up precautions.  Patient was in agreement with this plan of care and verbalized understanding.  All questions were answered.  Patient stable for discharge.  Final Clinical Impressions(s) / UC Diagnoses   Final diagnoses:  Cough, unspecified type  Moderate persistent asthma with acute exacerbation     Discharge Instructions      Chest x-ray is pending.  As discussed, you will be contacted once the results are received. Take medication as prescribed. Increase fluids and allow for plenty of rest. Recommend over-the-counter Tylenol as needed for pain, fever, or general discomfort. Recommend using a humidifier in your bedroom at nighttime during sleep and sleeping elevated on pillows while cough symptoms persist. Recommend starting a daily allergy medication. Go to the emergency department immediately if you experience worsening cough with shortness of breath, difficulty breathing, or other concerns. Follow-up with your primary care  physician as scheduled. Follow-up as needed.     ED Prescriptions     Medication Sig Dispense Auth. Provider   albuterol (PROVENTIL) (2.5 MG/3ML) 0.083% nebulizer solution Take 3 mLs (2.5 mg total) by nebulization every 6 (six) hours as needed for wheezing or  shortness of breath. 75 mL Leath-Warren, Sadie Haber, NP   HYDROcodone bit-homatropine (HYCODAN) 5-1.5 MG/5ML syrup Take 5 mLs by mouth every 6 (six) hours as needed for cough. 120 mL Leath-Warren, Sadie Haber, NP   azithromycin (ZITHROMAX) 250 MG tablet Take 1 tablet (250 mg total) by mouth daily. Take first 2 tablets together, then 1 every day until finished. 6 tablet Leath-Warren, Sadie Haber, NP   predniSONE (DELTASONE) 20 MG tablet Take 2 tablets (40 mg total) by mouth daily with breakfast for 5 days. 10 tablet Leath-Warren, Sadie Haber, NP      PDMP not reviewed this encounter.   Abran Cantor, NP 11/14/23 1226

## 2023-11-14 ENCOUNTER — Telehealth: Payer: Self-pay | Admitting: Nurse Practitioner

## 2023-11-14 NOTE — Telephone Encounter (Signed)
Patient return phone call.  Spoke with patient to inform of chest x-ray results, 2 patient identifiers were used.  Patient was advised of chest x-ray result, which indicated no pneumonia, she did have scarring and atelectasis seen, consistent with her asthma.  Patient was advised to continue current plan of care.  Patient also states that she does not have a nebulizer machine, which was not indicated at her appointment.  Patient was advised she may stop by the office to pick up a nebulizer machine.  Patient states she will come later this evening.  Patient was in agreement with this plan of care and verbalized understanding.  All questions were answered.

## 2023-11-14 NOTE — Telephone Encounter (Signed)
Called patient to discuss chest x-ray result.  Reach voicemail, left message for patient to return the phone call.

## 2023-11-15 ENCOUNTER — Other Ambulatory Visit: Payer: Self-pay

## 2023-11-15 ENCOUNTER — Telehealth: Payer: Self-pay | Admitting: Family Medicine

## 2023-11-15 DIAGNOSIS — J9811 Atelectasis: Secondary | ICD-10-CM

## 2023-11-15 NOTE — Telephone Encounter (Signed)
Source  Stamour, Technical brewer (Patient)   Subject  Wojciak, Technical brewer (Patient)   Topic  Clinical - Medication Refill    Communication  Most Recent Primary Care Visit:  Provider: Arta Silence     Department: [facility]     Visit Type: NURSE VISIT     Date: 07/12/2023        Medication: Patient called to advise that she was in urgent care last night for shortness of breath and cough. She was given medication for a nebulizer, which was able to obtain but the ED doctor did not prescribe her a nebulizer machine. Patient found a machine at [facility] but they require a prescription.        Patient would like Dr. Tanya Nones to write a prescription for a nebulizer machine to be sent to [facility].        Has the patient contacted their pharmacy? Yes    (Agent: If no, request that the patient contact the pharmacy for the refill. If patient does not wish to contact the pharmacy document the reason why and proceed with request.)    (Agent: If yes, when and what did the pharmacy advise?) Requested prescription for nebulizer machine.        Is this the correct pharmacy for this prescription? Yes    If no, delete pharmacy and type the correct one.    This is the patient's preferred pharmacy:        [address] [place] - [address] [place], [place], [place] [address]    [phone]            Has the prescription been filled recently? No        Is the patient out of the medication? Yes        Has the patient been seen for an appointment in the last year OR does the patient have an upcoming appointment? Yes        Can we respond through MyChart? Yes        Agent: Please be advised that Rx refills may take up to 3 business days. We ask that you follow-up with your pharmacy.

## 2024-06-03 ENCOUNTER — Encounter: Payer: Self-pay | Admitting: Family Medicine

## 2024-06-04 ENCOUNTER — Telehealth: Payer: Self-pay

## 2024-06-04 NOTE — Telephone Encounter (Signed)
 Copied from CRM [id number]. Topic: Clinical - Medical Advice >> Jun 04, 2024 10:30 AM Carlatta H wrote: Reason for CRM: Patient could only get an appointment for 6/20//she would like to know with her blood pressure being high will that be ok//Please advise

## 2024-06-07 ENCOUNTER — Ambulatory Visit: Admitting: Family Medicine

## 2024-06-07 ENCOUNTER — Encounter: Payer: Self-pay | Admitting: Family Medicine

## 2024-06-07 VITALS — BP 128/78 | HR 108 | Ht 62.0 in | Wt 203.8 lb

## 2024-06-07 DIAGNOSIS — R739 Hyperglycemia, unspecified: Secondary | ICD-10-CM | POA: Diagnosis not present

## 2024-06-07 NOTE — Progress Notes (Signed)
 Subjective:    Patient ID: Angela Bryan, female    DOB: 07-Nov-1991, 33 y.o.   MRN: [id number]  HPI Patient has a history of gestational diabetes.  Her mother also has prediabetes.  S patient is currently on Risperdal .  She recently had lab work with her job that showed a blood sugar of 105.  LDL cholesterol was mildly elevated.  HDL was mildly suppressed.  Blood pressure was extremely high however we checked her blood pressure today it was completely normal.  She is here today to discuss weight loss. Past Medical History:  Diagnosis Date   Allergy    Anxiety    Asthma    Depression    Diabetes mellitus without complication (HCC)    Headache    Mental disorder    Pelvic inflammatory disease 02/06/2017   PONV (postoperative nausea and vomiting)    Past Surgical History:  Procedure Laterality Date   CESAREAN SECTION  09/20/2020   Procedure: CESAREAN SECTION;  Surgeon: Prescilla Brod, MD;  Location: [facility] ORS;  Service: Obstetrics;;   Current Outpatient Medications on File Prior to Visit  Medication Sig Dispense Refill   albuterol  (PROVENTIL ) (2.5 MG/3ML) 0.083% nebulizer solution Take 3 mLs (2.5 mg total) by nebulization every 6 (six) hours as needed for wheezing or shortness of breath. 75 mL 0   ALPRAZolam  (XANAX ) 0.5 MG tablet Take 0.5 mg by mouth at bedtime as needed for anxiety.     busPIRone  (BUSPAR ) 5 MG tablet Take 5 mg by mouth 3 (three) times daily.     DULoxetine  (CYMBALTA ) 30 MG capsule Take 1 capsule (30 mg total) by mouth daily. 30 capsule 0   HYDROcodone  bit-homatropine (HYCODAN) 5-1.5 MG/5ML syrup Take 5 mLs by mouth every 6 (six) hours as needed for cough. 120 mL 0   levonorgestrel  (LILETTA , 52 MG,) 20.1 MCG/DAY IUD 1 each by Intrauterine route once.     lithium  carbonate 150 MG capsule Take 150 mg by mouth 3 (three) times daily with meals.     risperiDONE  (RISPERDAL ) 1 MG tablet Take 1 mg by mouth at bedtime.     azithromycin  (ZITHROMAX ) 250 MG tablet Take 1  tablet (250 mg total) by mouth daily. Take first 2 tablets together, then 1 every day until finished. (Patient not taking: Reported on 06/07/2024) 6 tablet 0   promethazine -dextromethorphan (PROMETHAZINE -DM) 6.25-15 MG/5ML syrup Take 5 mLs by mouth 4 (four) times daily as needed. (Patient not taking: Reported on 06/07/2024) 100 mL 0   No current facility-administered medications on file prior to visit.   Allergies  Allergen Reactions   Coffee Flavoring Agent (Non-Screening) Anaphylaxis   Latex Rash   Social History   Socioeconomic History   Marital status: Single    Spouse name: Not on file   Number of children: Not on file   Years of education: Not on file   Highest education level: Not on file  Occupational History   Not on file  Tobacco Use   Smoking status: Never   Smokeless tobacco: Never  Vaping Use   Vaping status: Never Used  Substance and Sexual Activity   Alcohol use: Yes    Comment: pt reports drinking once a month   Drug use: No   Sexual activity: Yes    Birth control/protection: I.U.D.  Other Topics Concern   Not on file  Social History Narrative   Not on file   Social Drivers of Health   Financial Resource Strain: Not on file  Food  Insecurity: No Food Insecurity (04/17/2023)   Hunger Vital Sign    Worried About Running Out of Food in the Last Year: Never true    Ran Out of Food in the Last Year: Never true  Transportation Needs: No Transportation Needs (04/17/2023)   PRAPARE - Administrator, Civil Service (Medical): No    Lack of Transportation (Non-Medical): No  Physical Activity: Not on file  Stress: Not on file  Social Connections: Not on file  Intimate Partner Violence: Not At Risk (04/17/2023)   Humiliation, Afraid, Rape, and Kick questionnaire    Fear of Current or Ex-Partner: No    Emotionally Abused: No    Physically Abused: No    Sexually Abused: No     Review of Systems  All other systems reviewed and are negative.       Objective:   Physical Exam Constitutional:      Appearance: Normal appearance.   Cardiovascular:     Rate and Rhythm: Normal rate and regular rhythm.     Heart sounds: Normal heart sounds. No murmur heard. Pulmonary:     Effort: Pulmonary effort is normal. No respiratory distress.     Breath sounds: Normal breath sounds. No stridor. No wheezing or rales.   Neurological:     Mental Status: She is alert.           Assessment & Plan:   Elevated blood sugar Patient's blood sugar level is slightly elevated.  I believe that this is a sign that she is developing insulin resistance.  This is likely related to her weight.  We discussed 30 minutes a day 5 days a week of aerobic exercise.  I recommended trying to lose 10 to 15 pounds with a low carbohydrate diet.  Her blood pressure today is excellent.  We also discussed Zepbound and [id number].  The patient will check with her insurance regarding this.  Patient is currently on Risperdal  which could be making it difficult for her to lose weight.  However she will discuss this with her psychiatrist

## 2024-06-14 ENCOUNTER — Ambulatory Visit: Admitting: Family Medicine

## 2024-07-03 ENCOUNTER — Encounter: Payer: Self-pay | Admitting: Family Medicine

## 2024-07-03 ENCOUNTER — Ambulatory Visit: Admitting: Family Medicine

## 2024-07-03 VITALS — BP 122/83 | HR 117 | Temp 98.3°F | Ht 62.0 in | Wt 202.4 lb

## 2024-07-03 DIAGNOSIS — J069 Acute upper respiratory infection, unspecified: Secondary | ICD-10-CM

## 2024-07-03 LAB — INFLUENZA A AND B AG, IMMUNOASSAY
INFLUENZA A ANTIGEN: NOT DETECTED
INFLUENZA B ANTIGEN: NOT DETECTED

## 2024-07-03 NOTE — Assessment & Plan Note (Addendum)
 Flu negative, declined covid or strep swab. Reassured patient that symptoms and exam findings are most consistent with a viral upper respiratory infection and explained lack of efficacy of antibiotics against viruses.  Discussed expected course and features suggestive of secondary bacterial infection.  Continue supportive care. Increase fluid intake with water or electrolyte solution like pedialyte. Encouraged acetaminophen  as needed for fever/pain. Encouraged salt water gargling, chloraseptic spray and throat lozenges. Encouraged OTC guaifenesin. Encouraged saline sinus flushes and/or neti with humidified air.

## 2024-07-03 NOTE — Progress Notes (Signed)
 Subjective:  HPI: Angela Bryan is a 33 y.o. female presenting on 07/03/2024 for Acute Visit (Sore throat , cough and fever, diarrhea, body aches )   HPI Patient is in today for sore throat, cough, fever to 100.9, body aches, diarrhea for 1 day. She does have some mucopurulent nasal drainage. No sinus pressure or pain or dental pain.  Her son did have strep a few weeks ago. Denies SOB, wheezing, ear pain. Has tried Nyquil and Dayquil.  Review of Systems  All other systems reviewed and are negative.   Relevant past medical history reviewed and updated as indicated.   Past Medical History:  Diagnosis Date   Allergy    Anxiety    Asthma    Depression    Diabetes mellitus without complication (HCC)    Headache    Mental disorder    Pelvic inflammatory disease 02/06/2017   PONV (postoperative nausea and vomiting)      Past Surgical History:  Procedure Laterality Date   CESAREAN SECTION  09/20/2020   Procedure: CESAREAN SECTION;  Surgeon: Verdon Keen, MD;  Location: [facility] ORS;  Service: Obstetrics;;    Allergies and medications reviewed and updated.   Current Outpatient Medications:    albuterol  (PROVENTIL ) (2.5 MG/3ML) 0.083% nebulizer solution, Take 3 mLs (2.5 mg total) by nebulization every 6 (six) hours as needed for wheezing or shortness of breath., Disp: 75 mL, Rfl: 0   ALPRAZolam  (XANAX ) 0.5 MG tablet, Take 0.5 mg by mouth at bedtime as needed for anxiety., Disp: , Rfl:    busPIRone  (BUSPAR ) 5 MG tablet, Take 5 mg by mouth 3 (three) times daily., Disp: , Rfl:    DULoxetine  (CYMBALTA ) 30 MG capsule, Take 1 capsule (30 mg total) by mouth daily., Disp: 30 capsule, Rfl: 0   levonorgestrel  (LILETTA , 52 MG,) 20.1 MCG/DAY IUD, 1 each by Intrauterine route once., Disp: , Rfl:    lithium  carbonate 150 MG capsule, Take 150 mg by mouth 3 (three) times daily with meals., Disp: , Rfl:    risperiDONE  (RISPERDAL ) 1 MG tablet, Take 1 mg by mouth at bedtime., Disp: , Rfl:     azithromycin  (ZITHROMAX ) 250 MG tablet, Take 1 tablet (250 mg total) by mouth daily. Take first 2 tablets together, then 1 every day until finished. (Patient not taking: Reported on 07/03/2024), Disp: 6 tablet, Rfl: 0   HYDROcodone  bit-homatropine (HYCODAN) 5-1.5 MG/5ML syrup, Take 5 mLs by mouth every 6 (six) hours as needed for cough. (Patient not taking: Reported on 07/03/2024), Disp: 120 mL, Rfl: 0   promethazine -dextromethorphan (PROMETHAZINE -DM) 6.25-15 MG/5ML syrup, Take 5 mLs by mouth 4 (four) times daily as needed. (Patient not taking: Reported on 07/03/2024), Disp: 100 mL, Rfl: 0  Allergies  Allergen Reactions   Coffee Flavoring Agent (Non-Screening) Anaphylaxis   Latex Rash    Objective:   BP 122/83   Pulse (!) 117   Temp 98.3 F (36.8 C)   Ht 5' 2 (1.575 m)   Wt 202 lb 6.4 oz (91.8 kg)   SpO2 98%   BMI 37.02 kg/m      07/03/2024    4:21 PM 06/07/2024    3:42 PM 11/13/2023    7:19 PM  Vitals with BMI  Height 5' 2 5' 2   Weight 202 lbs 6 oz 203 lbs 13 oz   BMI 37.01 37.27   Systolic 122 128 876  Diastolic 83 78 86  Pulse 117 108 100     Physical Exam Vitals and  nursing note reviewed.  Constitutional:      Appearance: Normal appearance. She is normal weight.  HENT:     Head: Normocephalic and atraumatic.     Right Ear: Ear canal and external ear normal. A middle ear effusion is present.     Left Ear: Tympanic membrane, ear canal and external ear normal.     Nose: Congestion present.     Right Sinus: No maxillary sinus tenderness or frontal sinus tenderness.     Left Sinus: No maxillary sinus tenderness or frontal sinus tenderness.     Mouth/Throat:     Mouth: Mucous membranes are moist.     Pharynx: Oropharynx is clear.  Eyes:     Conjunctiva/sclera: Conjunctivae normal.     Pupils: Pupils are equal, round, and reactive to light.  Cardiovascular:     Rate and Rhythm: Normal rate and regular rhythm.     Pulses: Normal pulses.     Heart sounds: Normal  heart sounds.  Pulmonary:     Effort: Pulmonary effort is normal.     Breath sounds: Normal breath sounds.  Musculoskeletal:     Cervical back: No tenderness.  Lymphadenopathy:     Cervical: No cervical adenopathy.  Skin:    General: Skin is warm and dry.  Neurological:     General: No focal deficit present.     Mental Status: She is alert and oriented to person, place, and time. Mental status is at baseline.  Psychiatric:        Mood and Affect: Mood normal.        Behavior: Behavior normal.        Thought Content: Thought content normal.        Judgment: Judgment normal.     Assessment & Plan:  Viral URI with cough Assessment & Plan: Reassured patient that symptoms and exam findings are most consistent with a viral upper respiratory infection and explained lack of efficacy of antibiotics against viruses.  Discussed expected course and features suggestive of secondary bacterial infection.  Continue supportive care. Increase fluid intake with water or electrolyte solution like pedialyte. Encouraged acetaminophen  as needed for fever/pain. Encouraged salt water gargling, chloraseptic spray and throat lozenges. Encouraged OTC guaifenesin. Encouraged saline sinus flushes and/or neti with humidified air.    Orders: -     Influenza A and B Ag, Immunoassay     Follow up plan: Return if symptoms worsen or fail to improve.  Jeoffrey GORMAN Barrio, FNP

## 2024-07-03 NOTE — Progress Notes (Signed)
 Subjective:  HPI: Angela Bryan is a 33 y.o. female presenting on 07/03/2024 for Acute Visit (Sore throat , cough and fever, diarrhea, body aches )   HPI Patient is in today for sore throat, cough, fever to 100.9, body aches, diarrhea for 1 day. She does have some mucopurulent nasal drainage. No sinus pressure or pain or dental pain.  Her son did have strep a few weeks ago. Denies SOB, wheezing, ear pain. Has tried Nyquil and Dayquil.  Review of Systems  All other systems reviewed and are negative.   Relevant past medical history reviewed and updated as indicated.   Past Medical History:  Diagnosis Date   Allergy    Anxiety    Asthma    Depression    Diabetes mellitus without complication (HCC)    Headache    Mental disorder    Pelvic inflammatory disease 02/06/2017   PONV (postoperative nausea and vomiting)      Past Surgical History:  Procedure Laterality Date   CESAREAN SECTION  09/20/2020   Procedure: CESAREAN SECTION;  Surgeon: Verdon Keen, MD;  Location: [facility] ORS;  Service: Obstetrics;;    Allergies and medications reviewed and updated.   Current Outpatient Medications:    albuterol  (PROVENTIL ) (2.5 MG/3ML) 0.083% nebulizer solution, Take 3 mLs (2.5 mg total) by nebulization every 6 (six) hours as needed for wheezing or shortness of breath., Disp: 75 mL, Rfl: 0   ALPRAZolam  (XANAX ) 0.5 MG tablet, Take 0.5 mg by mouth at bedtime as needed for anxiety., Disp: , Rfl:    busPIRone  (BUSPAR ) 5 MG tablet, Take 5 mg by mouth 3 (three) times daily., Disp: , Rfl:    DULoxetine  (CYMBALTA ) 30 MG capsule, Take 1 capsule (30 mg total) by mouth daily., Disp: 30 capsule, Rfl: 0   levonorgestrel  (LILETTA , 52 MG,) 20.1 MCG/DAY IUD, 1 each by Intrauterine route once., Disp: , Rfl:    lithium  carbonate 150 MG capsule, Take 150 mg by mouth 3 (three) times daily with meals., Disp: , Rfl:    risperiDONE  (RISPERDAL ) 1 MG tablet, Take 1 mg by mouth at bedtime., Disp: , Rfl:     azithromycin  (ZITHROMAX ) 250 MG tablet, Take 1 tablet (250 mg total) by mouth daily. Take first 2 tablets together, then 1 every day until finished. (Patient not taking: Reported on 07/03/2024), Disp: 6 tablet, Rfl: 0   HYDROcodone  bit-homatropine (HYCODAN) 5-1.5 MG/5ML syrup, Take 5 mLs by mouth every 6 (six) hours as needed for cough. (Patient not taking: Reported on 07/03/2024), Disp: 120 mL, Rfl: 0   promethazine -dextromethorphan (PROMETHAZINE -DM) 6.25-15 MG/5ML syrup, Take 5 mLs by mouth 4 (four) times daily as needed. (Patient not taking: Reported on 07/03/2024), Disp: 100 mL, Rfl: 0  Allergies  Allergen Reactions   Coffee Flavoring Agent (Non-Screening) Anaphylaxis   Latex Rash    Objective:   BP 122/83   Pulse (!) 117   Temp 98.3 F (36.8 C)   Ht 5' 2 (1.575 m)   Wt 202 lb 6.4 oz (91.8 kg)   SpO2 98%   BMI 37.02 kg/m      07/03/2024    4:21 PM 06/07/2024    3:42 PM 11/13/2023    7:19 PM  Vitals with BMI  Height 5' 2 5' 2   Weight 202 lbs 6 oz 203 lbs 13 oz   BMI 37.01 37.27   Systolic 122 128 876  Diastolic 83 78 86  Pulse 117 108 100     Physical Exam Vitals and  nursing note reviewed.  Constitutional:      Appearance: Normal appearance. She is normal weight.  HENT:     Head: Normocephalic and atraumatic.     Right Ear: Ear canal and external ear normal. A middle ear effusion is present.     Left Ear: Tympanic membrane, ear canal and external ear normal.     Nose: Congestion present.     Right Sinus: No maxillary sinus tenderness or frontal sinus tenderness.     Left Sinus: No maxillary sinus tenderness or frontal sinus tenderness.     Mouth/Throat:     Mouth: Mucous membranes are moist.     Pharynx: Oropharynx is clear.  Eyes:     Conjunctiva/sclera: Conjunctivae normal.     Pupils: Pupils are equal, round, and reactive to light.  Cardiovascular:     Rate and Rhythm: Normal rate and regular rhythm.     Pulses: Normal pulses.     Heart sounds: Normal  heart sounds.  Pulmonary:     Effort: Pulmonary effort is normal.     Breath sounds: Normal breath sounds.  Musculoskeletal:     Cervical back: No tenderness.  Lymphadenopathy:     Cervical: No cervical adenopathy.  Skin:    General: Skin is warm and dry.  Neurological:     General: No focal deficit present.     Mental Status: She is alert and oriented to person, place, and time. Mental status is at baseline.  Psychiatric:        Mood and Affect: Mood normal.        Behavior: Behavior normal.        Thought Content: Thought content normal.        Judgment: Judgment normal.     Assessment & Plan:  Viral URI with cough Assessment & Plan: Flu negative, declined covid or strep swab. Reassured patient that symptoms and exam findings are most consistent with a viral upper respiratory infection and explained lack of efficacy of antibiotics against viruses.  Discussed expected course and features suggestive of secondary bacterial infection.  Continue supportive care. Increase fluid intake with water or electrolyte solution like pedialyte. Encouraged acetaminophen  as needed for fever/pain. Encouraged salt water gargling, chloraseptic spray and throat lozenges. Encouraged OTC guaifenesin. Encouraged saline sinus flushes and/or neti with humidified air.    Orders: -     Influenza A and B Ag, Immunoassay     Follow up plan: Return if symptoms worsen or fail to improve.  Jeoffrey GORMAN Barrio, FNP

## 2024-12-17 LAB — LAB REPORT - SCANNED
A1c: 6
EGFR: 109
Free T4: 2.2 ng/dL

## 2025-01-02 ENCOUNTER — Telehealth: Payer: Self-pay

## 2025-01-02 NOTE — Telephone Encounter (Signed)
 Copied from CRM [id number]. Topic: General - Call Back - No Documentation >> Jan 02, 2025  3:30 PM Olam RAMAN wrote: Reason for CRM: pt stated pysch ran blood work and came back different and needed to know an update if results were sent to provider Cb [phone]
